# Patient Record
Sex: Male | Born: 1960 | Race: White | Hispanic: No | Marital: Married | Smoking: Former smoker
Health system: Southern US, Community
[De-identification: ages and names within clinical notes are randomized; demographics above are authoritative.]

## PROBLEM LIST (undated history)

## (undated) DIAGNOSIS — I2699 Other pulmonary embolism without acute cor pulmonale: Secondary | ICD-10-CM

## (undated) DIAGNOSIS — I499 Cardiac arrhythmia, unspecified: Secondary | ICD-10-CM

## (undated) DIAGNOSIS — G4730 Sleep apnea, unspecified: Secondary | ICD-10-CM

## (undated) HISTORY — PX: COLONOSCOPY: SHX174

---

## 1988-08-24 DIAGNOSIS — C801 Malignant (primary) neoplasm, unspecified: Secondary | ICD-10-CM

## 1988-08-24 HISTORY — DX: Malignant (primary) neoplasm, unspecified: C80.1

## 1999-02-05 ENCOUNTER — Emergency Department (HOSPITAL_COMMUNITY): Admission: EM | Admit: 1999-02-05 | Discharge: 1999-02-05 | Payer: Self-pay | Admitting: Internal Medicine

## 2011-02-15 ENCOUNTER — Emergency Department: Payer: Self-pay | Admitting: Emergency Medicine

## 2011-02-20 ENCOUNTER — Other Ambulatory Visit: Payer: Self-pay | Admitting: Vascular Surgery

## 2011-08-07 ENCOUNTER — Other Ambulatory Visit: Payer: Self-pay

## 2012-06-28 IMAGING — US US EXTREM LOW VENOUS*L*
1 series · 17 of 24 positions shown · non-contrast
Comparison: none

REASON FOR EXAM: pain c/o pain and redness..R/O DVT
COMMENTS:

PROCEDURE:     US  - US DOPPLER LOW EXTR LEFT  - February 15, 2011  [DATE]
RESULT:     Comparison: None

[Series 1: us extrem low venous*left* · 17 of 39 slices shown]
[im 1/39]
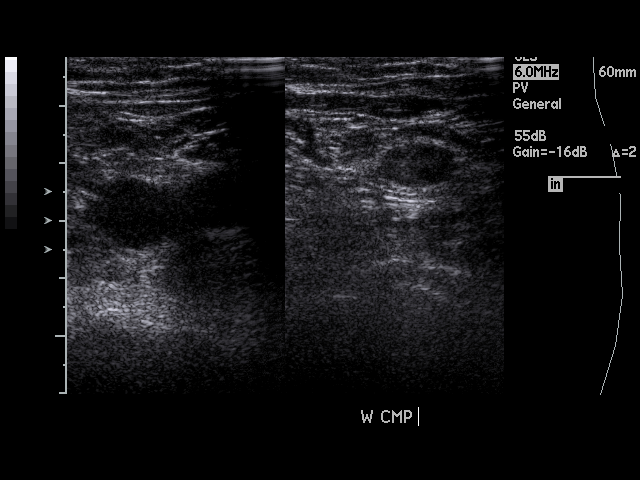
[im 4/39]
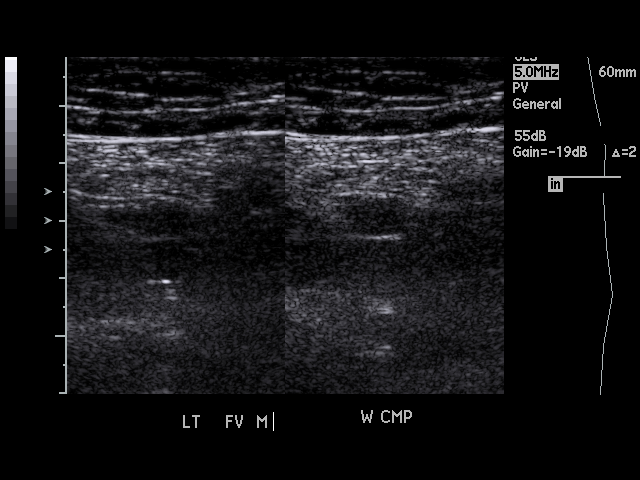
[im 5/39]
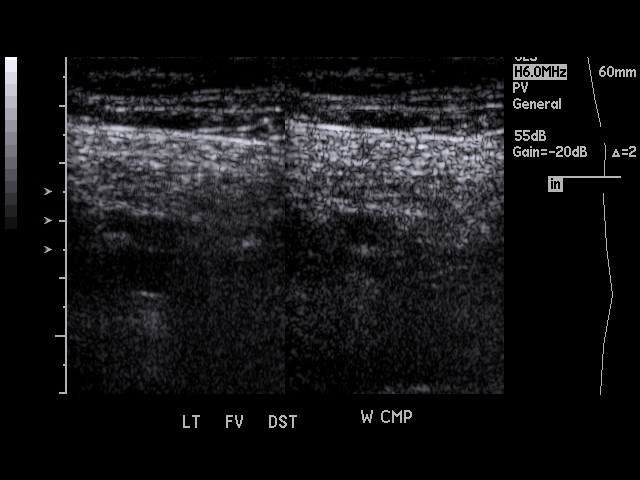
[im 7/39]
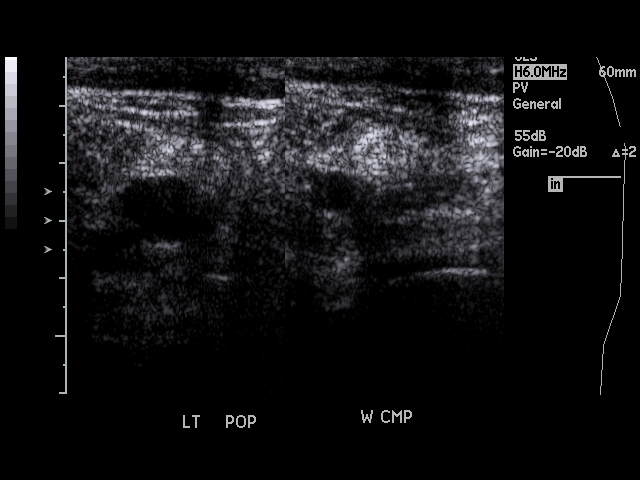
[im 10/39]
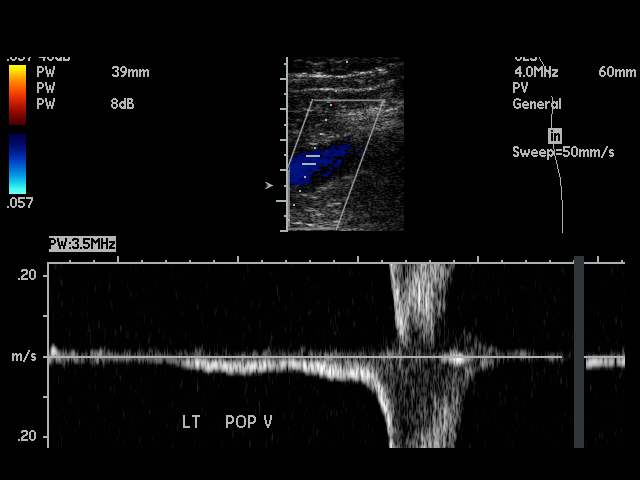
[im 12/39]
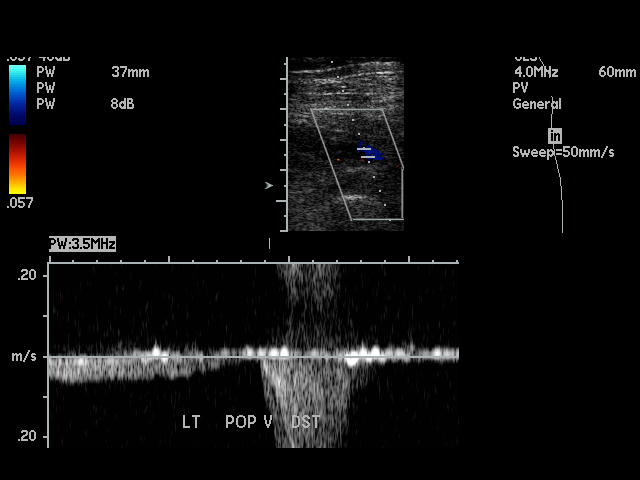
[im 15/39]
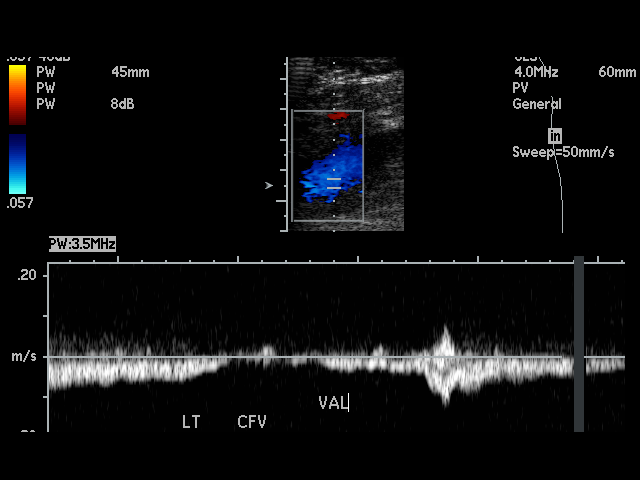
[im 17/39]
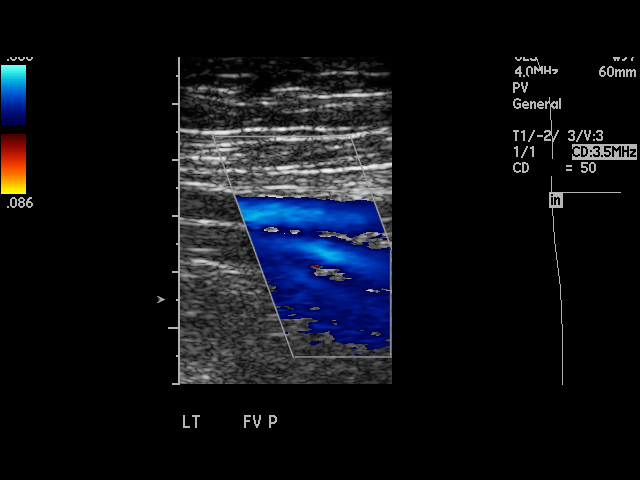
[im 20/39]
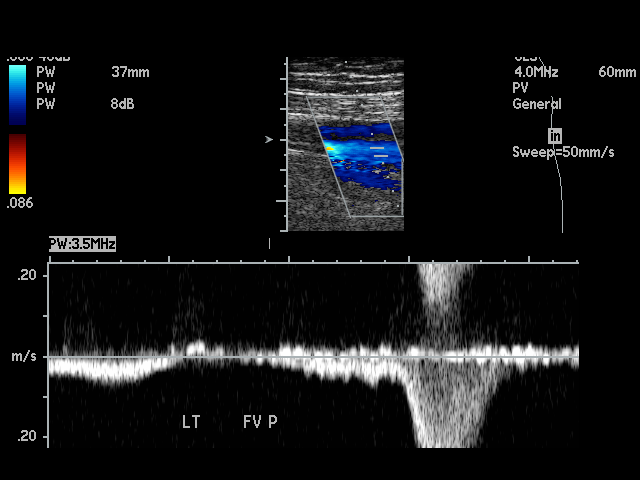
[im 22/39]
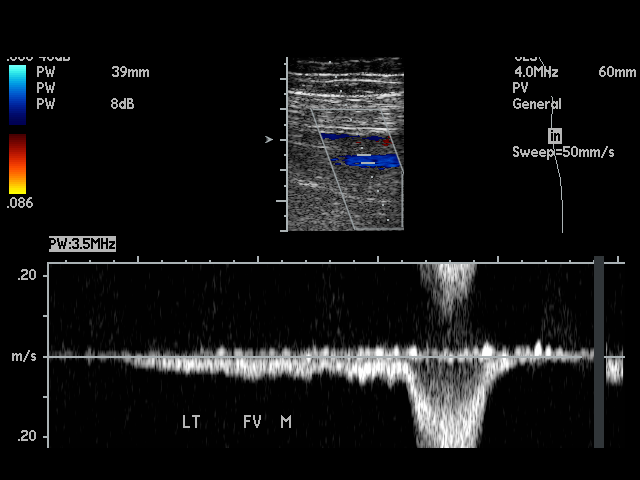
[im 24/39]
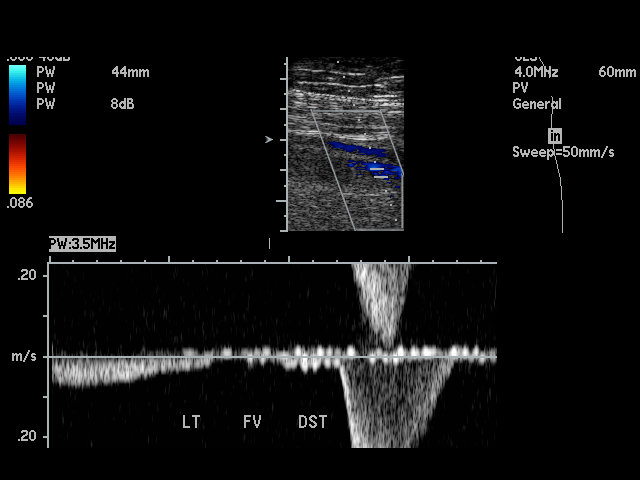
[im 27/39]
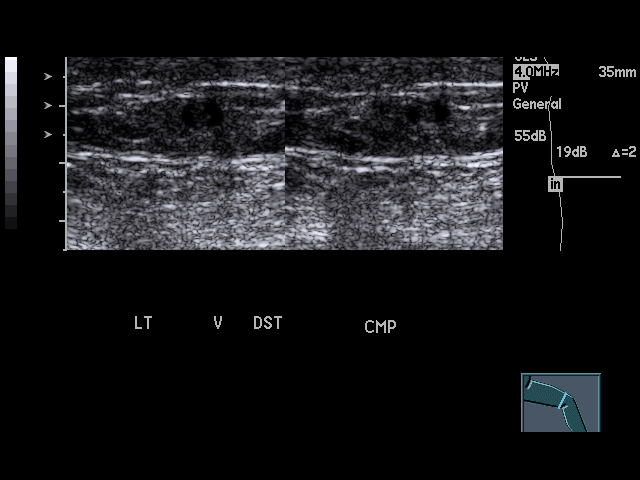
[im 29/39]
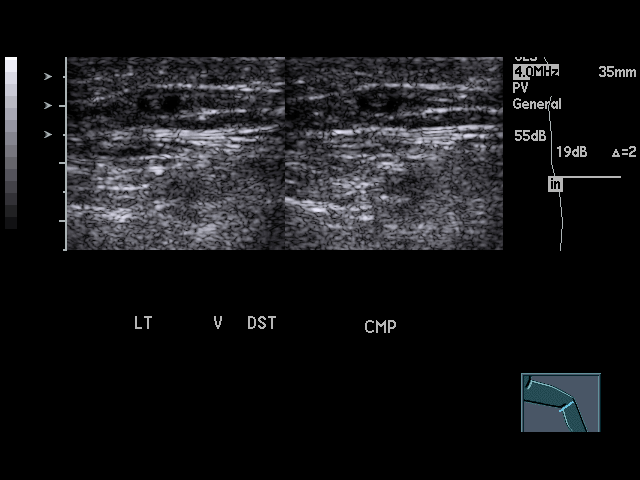
[im 32/39]
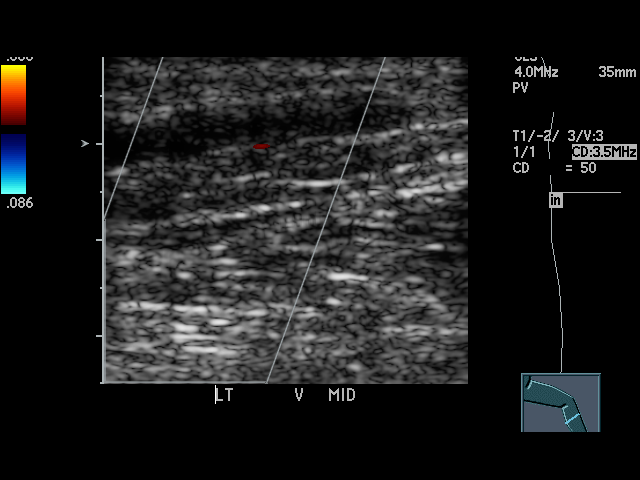
[im 34/39]
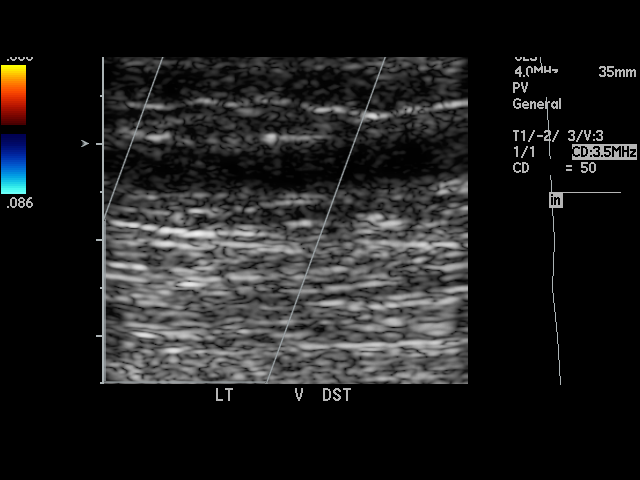
[im 35/39]
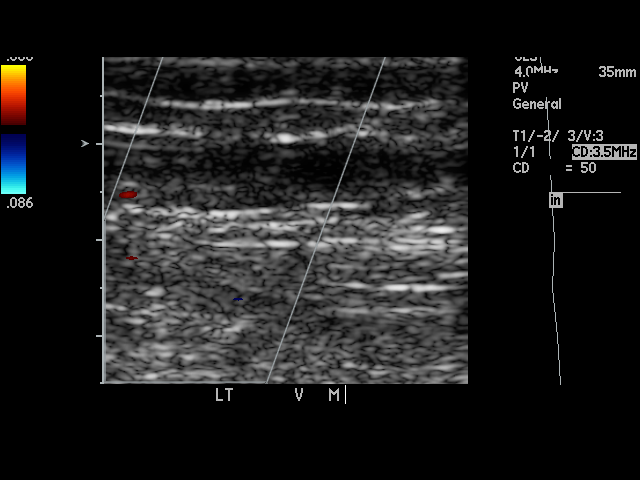
[im 39/39]
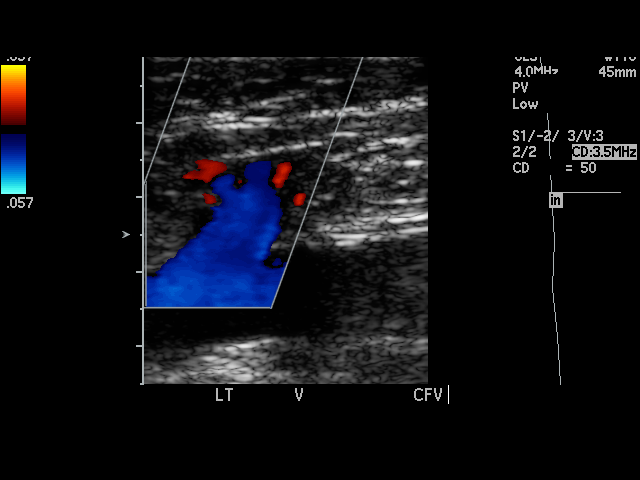

[17 of 24 positions shown; findings below may reference images not displayed]

FINDINGS: Multiple longitudinal and transverse gray-scale as well as color
and spectral Doppler images of the left lower extremity veins were obtained
from the common femoral veins through the popliteal veins.

The left common femoral, femoral, popliteal veins, and venous trifurcation
are patent, demonstrating normal color-flow and compressibility. No
intraluminal thrombus is identified.There is normal respiratory variation
and augmentation demonstrated at all vein levels.

The greater saphenous vein is noncompressible consistent with thrombosis.
IMPRESSION: 1. No evidence of DVT in the left lower extremity.

2. Superficial phlebitis of the greater left saphenous vein.

## 2012-10-16 ENCOUNTER — Inpatient Hospital Stay: Payer: Self-pay | Admitting: Internal Medicine

## 2012-10-16 LAB — CK TOTAL AND CKMB (NOT AT ARMC)
CK, Total: 80 U/L (ref 35–232)
CK, Total: 82 U/L (ref 35–232)
CK-MB: 1.3 ng/mL (ref 0.5–3.6)
CK-MB: 0.8 ng/mL (ref 0.5–3.6)

## 2012-10-16 LAB — BASIC METABOLIC PANEL
Anion Gap: 8 (ref 7–16)
Co2: 22 mmol/L (ref 21–32)
Creatinine: 1.07 mg/dL (ref 0.60–1.30)
EGFR (Non-African Amer.): >60
Glucose: 99 mg/dL (ref 65–99)

## 2012-10-16 LAB — CBC
HCT: 45.4 % (ref 40.0–52.0)
HGB: 15.3 g/dL (ref 13.0–18.0)
MCHC: 33.7 g/dL (ref 32.0–36.0)
MCV: 95 fL (ref 80–100)
RBC: 4.8 10*6/uL (ref 4.40–5.90)
RDW: 13.1 % (ref 11.5–14.5)
WBC: 14.2 10*3/uL — ABNORMAL HIGH (ref 3.8–10.6)

## 2012-10-16 LAB — TROPONIN I: Troponin-I: <0.02 ng/mL

## 2012-10-16 LAB — PRO B NATRIURETIC PEPTIDE: B-Type Natriuretic Peptide: 748 pg/mL — ABNORMAL HIGH (ref 0–125)

## 2012-10-17 LAB — LIPID PANEL
Cholesterol: 239 mg/dL — ABNORMAL HIGH (ref 0–200)
Ldl Cholesterol, Calc: 164 mg/dL — ABNORMAL HIGH (ref 0–100)
VLDL Cholesterol, Calc: 16 mg/dL (ref 5–40)

## 2012-10-17 LAB — CBC WITH DIFFERENTIAL/PLATELET
Basophil #: 0 10*3/uL (ref 0.0–0.1)
HCT: 45 % (ref 40.0–52.0)
HGB: 14.9 g/dL (ref 13.0–18.0)
Lymphocyte #: 1.1 10*3/uL (ref 1.0–3.6)
Lymphocyte %: 5.9 %
MCH: 31.4 pg (ref 26.0–34.0)
MCHC: 33.2 g/dL (ref 32.0–36.0)
Monocyte #: 0.2 x10 3/mm (ref 0.2–1.0)
Neutrophil #: 17.9 10*3/uL — ABNORMAL HIGH (ref 1.4–6.5)
Platelet: 209 10*3/uL (ref 150–440)
RDW: 13.1 % (ref 11.5–14.5)
WBC: 19.2 10*3/uL — ABNORMAL HIGH (ref 3.8–10.6)

## 2012-10-22 LAB — CULTURE, BLOOD (SINGLE)

## 2013-06-07 ENCOUNTER — Ambulatory Visit: Payer: Self-pay | Admitting: Family Medicine

## 2013-06-07 LAB — CREATININE, SERUM: Creatinine: 0.96 mg/dL (ref 0.60–1.30)

## 2014-02-27 IMAGING — CR DG CHEST 2V
1 series · 2 of 2 positions shown · non-contrast
Comparison: none

REASON FOR EXAM: SOB
COMMENTS:

PROCEDURE:     DXR - DXR CHEST PA (OR AP) AND LATERAL  - October 16, 2012  [DATE]
RESULT:     The lungs are well-expanded. The interstitial markings are
increased diffusely. The cardiac silhouette is not enlarged. The pulmonary
vascularity is mildly prominent centrally.

[Series 1: w chest pa · 0.14mm/px · 2 of 2 slices shown]
[im 1/2]
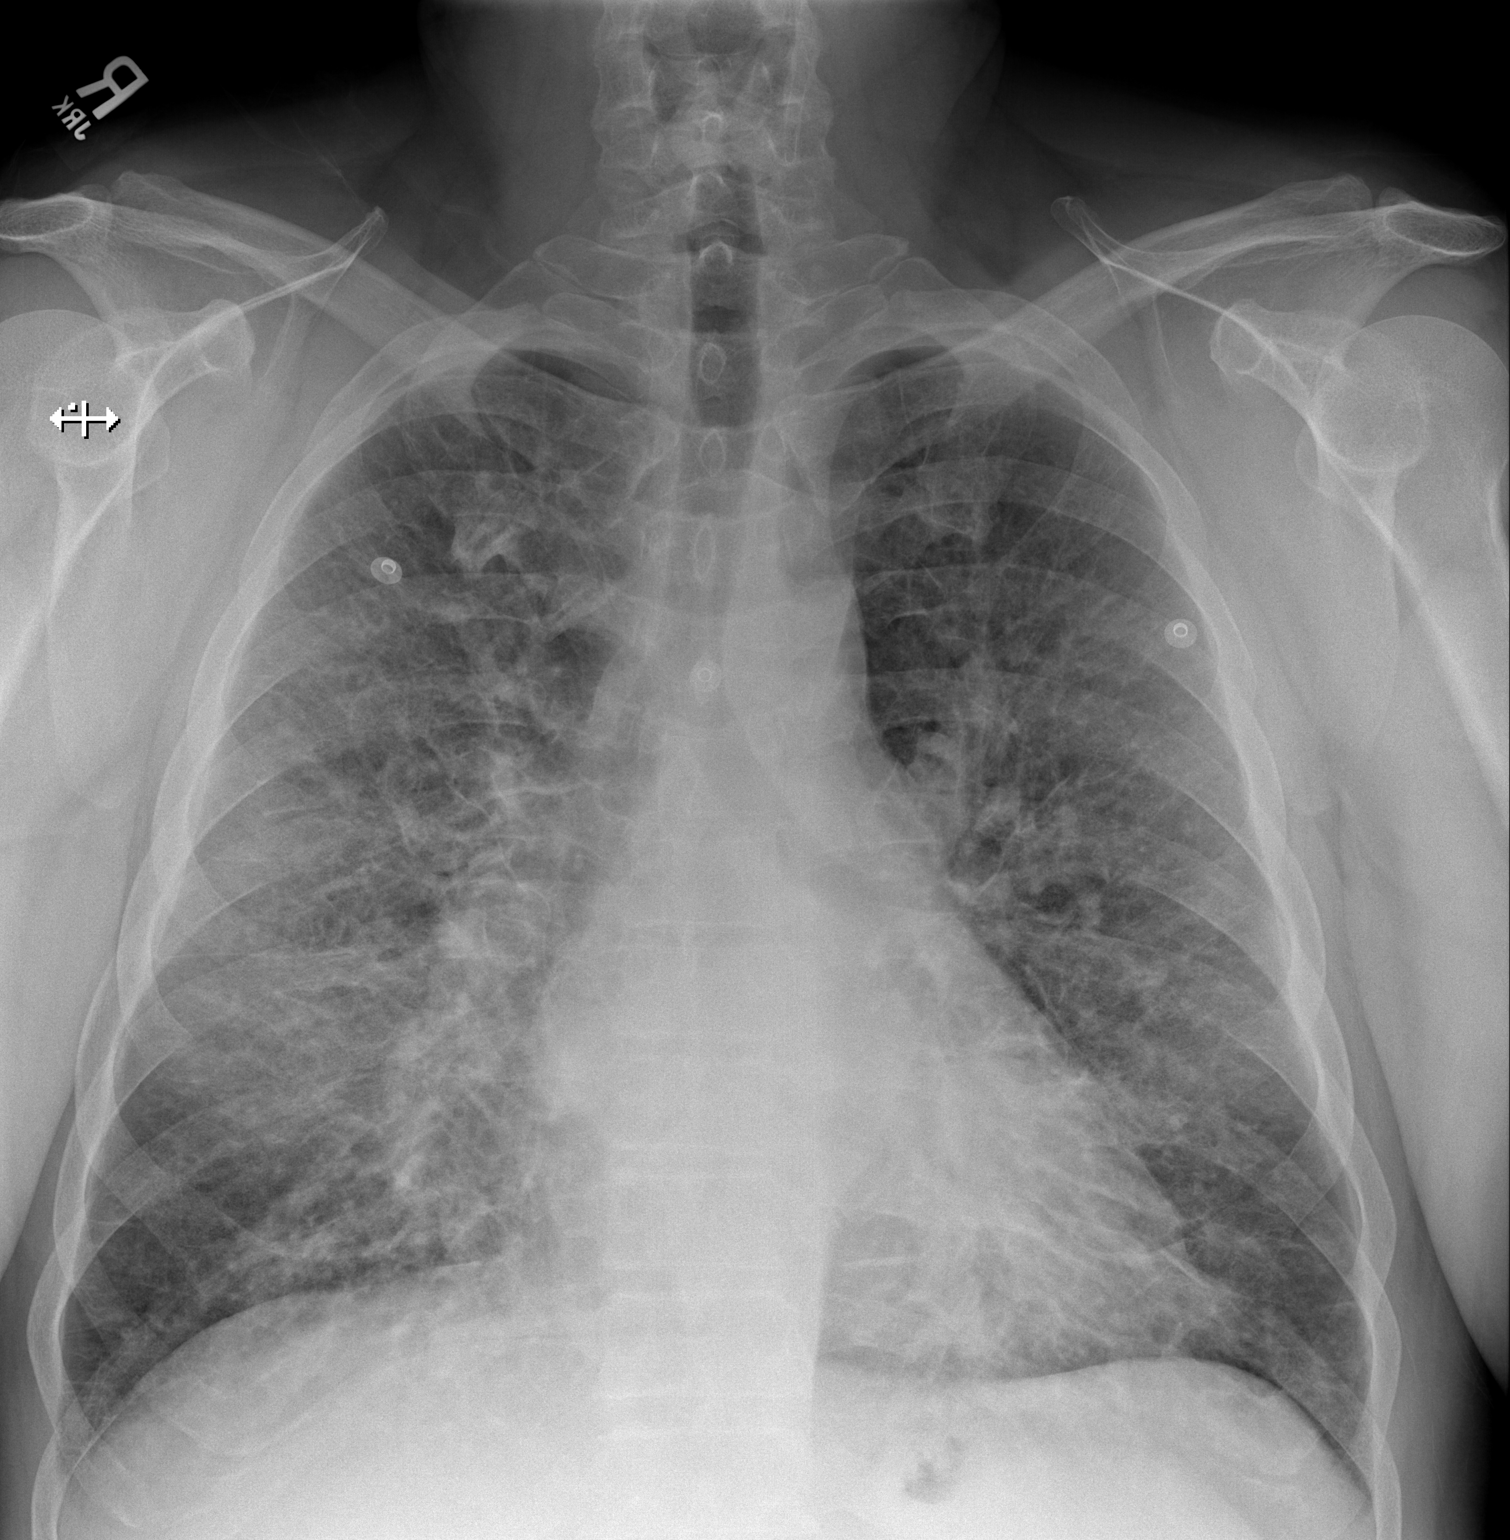
[im 2/2]
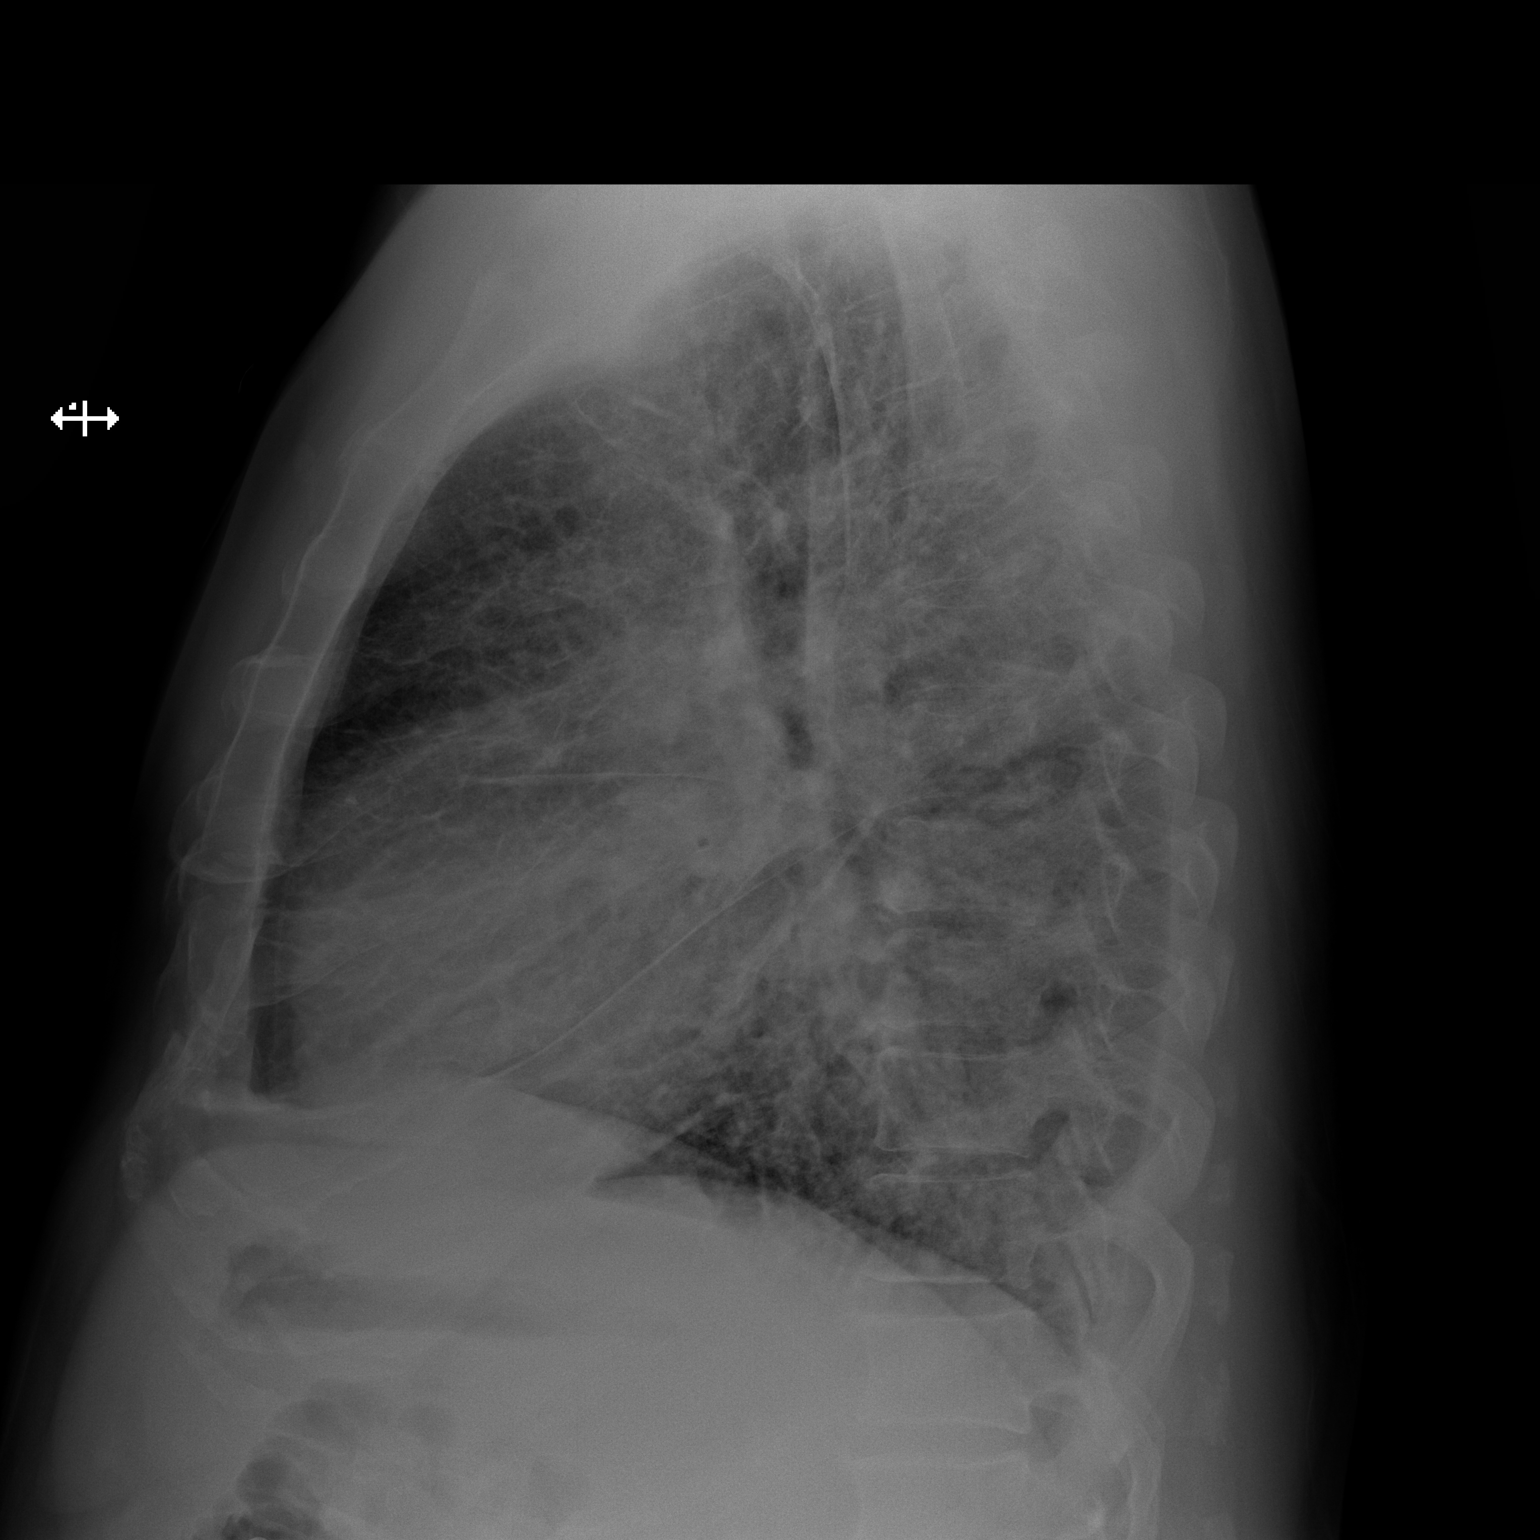

[2 of 2 positions shown; findings below may reference images not displayed]

IMPRESSION: The findings are consistent with interstitial pneumonia.
There are areas of near confluence as well. There may be an element of
low-grade CHF in the appropriate clinical setting though I favor a primary
pulmonary parenchymal process.

Given the chronicity of the patient's symptoms followup pulmonary evaluation
and CT scanning is recommended.

[REDACTED]

## 2014-12-14 NOTE — Discharge Summary (Signed)
DATE OF BIRTH:  [birth year]  DATE OF ADMISSION:  10/16/2012  DATE OF DISCHARGE:  10/17/2012  PRIMARY CARE PHYSICIAN:  Dr. Astrid Divine   FINAL DIAGNOSES: 1.  Acute respiratory failure, which improved.  2.  Systemic inflammatory response syndrome, possible pneumonia.  3.  Chronic obstructive pulmonary disease exacerbation.  4.  Malignant hypertension.  5.  Tobacco abuse.  6.  Hyperlipidemia.   MEDICATIONS ON DISCHARGE:  Include Tamiflu 75 mg every 12 hours until completed, Levaquin 500 mg every 24 hours until completed, prednisone 5 mg - 4 tabs day 1, 3 tabs day 2, 2 tabs day 3, 1 tab day 4 and 5; aspirin 81 mg daily, lisinopril 5 mg daily.   DIET:  Regular consistency, low sodium diet.   ACTIVITY:  As tolerated.   FOLLOW UP:  With Cardiology, Dr. Clayborn Bigness, 1 to 2 weeks. Followup in 1 to 2 weeks with Dr. Astrid Divine.   REASON FOR ADMISSION: The patient was admitted 10/16/2012, discharged 10/17/2012. Came in with shortness of breath.   HISTORY OF PRESENT ILLNESS:  A 54 year old man who came to the Emergency Room with sudden shortness of breath. He is a smoker. In the ER, his initial blood pressure was 198/97. He had an elevated white count. He was admitted for shortness of breath, which initially was thought to be multifactorial related to pneumonia and COPD.  Laboratory and radiological data during the hospital course included a sinus tachycardia, left atrial enlargement. His BNP was 748, glucose 99, BUN 17, creatinine 1.07, sodium 140, potassium 3.9, chloride 110, CO2 of 22, calcium 8.3. White blood cell count 14.2, H and H 15.3 and 45.4, platelet count of 200. Troponin negative.  Chest x-ray: Findings consistent with interstitial pneumonia, areas of near confluence may be an element of low-grade CHF in the appropriate clinical setting, although I favor a primary pulmonary parenchymal process. Blood culture is negative. Influenza A and B negative. Troponin negative x 3. LDL 164, HDL 59,  triglycerides 81. Echocardiogram in a verbal report showed EF 45%, with some apical hypokinesis.   HOSPITAL COURSE PER PROBLEM LIST:  1.  For the patient's acute respiratory failure, this had resolved relatively quickly. The patient was on oxygen. The next day he was off oxygen and feeling much better.  2. Systemic inflammatory response syndrome, with elevated white count and tachycardia, pneumonia on chest x-ray. The patient was given Levaquin, also empiric Tamiflu was given for possible flu. The patient improved.   3.  Chronic obstructive pulmonary disease exacerbation. The patient was placed on antibiotics and steroids. Advised to stop smoking. Lungs were clear upon discharge. The patient was given a quick prednisone taper.   4.  Malignant hypertension. The patient had one blood pressure that was elevated. The rest of the blood pressures have been good. Since the patient did have apical hypokinesis on the echo, I did recommend followup with Dr. Clayborn Bigness, Cardiology, as outpatient, and he was given a low dose of lisinopril. I do not believe that this was congestive heart failure.   5.  Tobacco abuse. Smoking cessation counseling done 3 minutes by me. Nicotine patch refused.   6.  Hyperlipidemia. Can consider lipid-lowering medication as outpatient, once over this acute episode. LDL 164.   Close clinical followup as outpatient needed. Again, I do not believe this is congestive heart failure. I believe this is pneumonia.   Time spent on discharge:  35 minutes.     ____________________________ Tana Conch. Leslye Peer, MD rjw:mr D: 10/18/2012 17:25:00 ET T: 10/18/2012  19:28:32 ET JOB#: [id number]  cc: Floria Raveling. Astrid Divine, MD Dwayne D. Clayborn Bigness, MD Tana Conch. Leslye Peer, MD, <Dictator>    Marisue Brooklyn MD ELECTRONICALLY SIGNED 10/24/2012 18:37

## 2014-12-14 NOTE — H&P (Signed)
PATIENT NAME:  Mason Frank, Mason Frank MR#:  [id number] DATE OF BIRTH:  [birth year]  DATE OF ADMISSION:  10/16/2012  PRIMARY CARE PHYSICIAN: None.  CHIEF COMPLAINT: Shortness of breath.  HISTORY OF PRESENT ILLNESS: The patient is a 54 year old white male with no past medical history, presented to the Emergency Department with complaints of shortness of breath which woke him up around 4 a.m. from sleep.  The patient has been having cough for the last 3 months.  He had mild shortness of breath with exertion.  He denied having any increased swelling in the lower extremities.  He denies having any chest pain.  He denies having any fever.  The patient continues to smoke over a pack a day, drinks 3 to 4 beers a day.  He denies having any paroxysmal nocturnal dyspnea or orthopnea.  Workup in the Emergency Department showed the patient was tachycardiac with a systolic blood pressure of 198/97.  Chest x-ray revealed increased vascular congestion, concern about a left lower lobe patchy infiltrate.  The patient also has elevated BNP of over 750.  The patient has elevated white blood cell count of 14,000.  Concerning this, the patient received 1 dose of Levaquin in the Emergency Department.   PAST MEDICAL HISTORY: Superficial venous thrombosis of the left leg, on aspirin.  PAST SURGICAL HISTORY: None.   HOME MEDICATIONS:  Aspirin 81 mg daily.   ALLERGIES:  No known drug allergies.   SOCIAL HISTORY: He smokes over a pack a day.  He drinks alcohol, 3 to 4 beers a day.  He denies using any illicit drugs.  He works as a Administrator.  He is married, lives with his wife.   FAMILY HISTORY: Father is 42 with history of heart problems.  Mother is also 75, healthy.   REVIEW OF SYSTEMS:  CONSTITUTIONAL: He denies having any fatigue, weakness or weight gain.  EYES: He denies any vision change. EARS, NOSE AND THROAT: Denies having any sore throat, runny nose, or difficulty swallowing. RESPIRATIONS: He has shortness of  breath.  He has cough for 3 months.  CARDIOVASCULAR: No chest pain, palpitations.  GASTROINTESTINAL: No nausea, vomiting or diarrhea.  No abdominal pain.  No blood in his stool.  GENITOURINARY: Denies any dysuria, hematuria, increased frequency or urgency of urination.  ENDOCRINE: Denies any polyuria or polydipsia.  HEMATOLOGIC: No easy bruising or bleeding.  SKIN: No rash. MUSCULOSKELETAL: Denies having any joint pains or aches.  NEUROLOGICAL:  Denies having any numbness, weakness of any part of the body.   PHYSICAL EXAMINATION:  GENERAL: This is a well-built, well-nourished, age-appropriate male lying down on the bed, not in distress. VITAL SIGNS: Temperature 98.2, pulse 116, blood pressure 198/97, respiratory rate of 28, oxygen saturations initially were 89%, currently 93% on 2 liters of oxygen.  HEENT: Head normocephalic, atraumatic.  Eyes: No scleral icterus.  Conjunctivae normal.  Pupils are equal and reactive to light. Mucous membranes moist. NECK: Supple.  No lymphadenopathy.  No JVD.  No carotid bruits.   CHEST:  No focal tenderness.  Prolonged expiratory phase.  Bilateral wheezing, bibasilar crackles are heard.  HEART: S2, S2 regular.  Tachycardia.  ABDOMEN: Bowel sounds are plus.  Soft, nontender, nondistended.   EXTREMITIES: No pedal edema.  Pulses 2+.  NEUROLOGICAL: The patient is alert and oriented to place, person and time.  No apparent cranial nerve abnormalities.  No motor and sensory deficits.   LABORATORY, DIAGNOSTIC AND RADIOLOGICAL DATA:  CBC:  WBC of 14.2, hemoglobin 15.3, platelet count  200. BNP 748.  Cardiac enzymes are negative.    EKG, 12-lead:  Poor R-wave progression in the anteroseptal leads.   ASSESSMENT/PLAN: The patient is a 54 year old male with a history of heavy tobacco use, alcohol use, comes to the Emergency Department with sudden onset of shortness of breath.    1. Shortness of breath:  This seems to be multifactorial.  The patient may be having  underlying mild pneumonia, COPD exacerbation which might have caused strain on the heart causing him to have congestive heart failure.  Admit the patient to the monitored bed. Treat the underlying problems.   2. Pneumonia: The patient has elevated white blood cell count, has cough with some productive sputum.  We will keep the patient on Levaquin.  3. Chronic obstructive pulmonary disease exacerbation:  Continue the breathing treatments and Solu-Medrol.  4. Congestive heart failure:  Very highly concerning.  The patient has Q waves in the anteroseptal leads, has elevated BNP.  Chest x-ray shows vascular congestion.  I will obtain an echocardiogram, keep the patient on Lasix b.i.d.   5. Hypertension, poorly controlled:  I will keep the patient on Lisinopril 20 mg daily. However, the patient also will be given IV labetolol as needed . I will continue to follow up with the blood pressure.  6. Continued tobacco use:  I counseled with the patient.  The patient expressed understanding, stated that he will strongly consider quitting smoking.  If the patient gets anxious, we will keep the patient on a nicotine patch.  7. Alcohol use:  We will keep the patient on thiamine and folic acid.  We will watch closely for any alcohol withdrawal signs.  8. We will keep the patient on DVT prophylaxis with Lovenox.   ____________________________ Monica Becton, MD pv:cb D: 10/16/2012 08:09:03 ET T: 10/16/2012 08:25:18 ET JOB#: [id number]  cc: Monica Becton, MD, <Dictator> Monica Becton MD ELECTRONICALLY SIGNED 10/17/2012 7:25

## 2015-01-15 DIAGNOSIS — I429 Cardiomyopathy, unspecified: Secondary | ICD-10-CM | POA: Insufficient documentation

## 2015-01-15 DIAGNOSIS — C44310 Basal cell carcinoma of skin of unspecified parts of face: Secondary | ICD-10-CM | POA: Insufficient documentation

## 2015-02-05 DIAGNOSIS — M1A00X0 Idiopathic chronic gout, unspecified site, without tophus (tophi): Secondary | ICD-10-CM | POA: Insufficient documentation

## 2018-05-31 DIAGNOSIS — G4733 Obstructive sleep apnea (adult) (pediatric): Secondary | ICD-10-CM | POA: Insufficient documentation

## 2019-02-10 ENCOUNTER — Other Ambulatory Visit: Payer: Self-pay

## 2019-02-10 ENCOUNTER — Other Ambulatory Visit
Admission: RE | Admit: 2019-02-10 | Discharge: 2019-02-10 | Disposition: A | Discharge status: Home / Self Care | Payer: BC Managed Care – PPO | Source: Ambulatory Visit | Attending: Internal Medicine | Admitting: Internal Medicine

## 2019-02-10 DIAGNOSIS — Z1159 Encounter for screening for other viral diseases: Secondary | ICD-10-CM | POA: Insufficient documentation

## 2019-02-11 LAB — NOVEL CORONAVIRUS, NAA (HOSP ORDER, SEND-OUT TO REF LAB; TAT 18-24 HRS): SARS-CoV-2, NAA: NOT DETECTED

## 2019-02-15 ENCOUNTER — Ambulatory Visit: Admit: 2019-02-15 | Payer: BC Managed Care – PPO | Admitting: Internal Medicine

## 2019-02-15 ENCOUNTER — Other Ambulatory Visit: Payer: Self-pay

## 2019-02-15 ENCOUNTER — Encounter
Admission: RE | Disposition: A | Discharge status: Home / Self Care | Payer: Self-pay | Source: Home / Self Care | Attending: Internal Medicine

## 2019-02-15 ENCOUNTER — Ambulatory Visit
Admission: RE | Admit: 2019-02-15 | Discharge: 2019-02-15 | Disposition: A | Discharge status: Home / Self Care | Payer: BC Managed Care – PPO | Attending: Internal Medicine | Admitting: Internal Medicine

## 2019-02-15 DIAGNOSIS — I429 Cardiomyopathy, unspecified: Secondary | ICD-10-CM | POA: Insufficient documentation

## 2019-02-15 DIAGNOSIS — E669 Obesity, unspecified: Secondary | ICD-10-CM | POA: Diagnosis not present

## 2019-02-15 DIAGNOSIS — F17210 Nicotine dependence, cigarettes, uncomplicated: Secondary | ICD-10-CM | POA: Insufficient documentation

## 2019-02-15 DIAGNOSIS — Z7982 Long term (current) use of aspirin: Secondary | ICD-10-CM | POA: Insufficient documentation

## 2019-02-15 DIAGNOSIS — R9439 Abnormal result of other cardiovascular function study: Secondary | ICD-10-CM

## 2019-02-15 DIAGNOSIS — Z6835 Body mass index (BMI) 35.0-35.9, adult: Secondary | ICD-10-CM | POA: Diagnosis not present

## 2019-02-15 DIAGNOSIS — J432 Centrilobular emphysema: Secondary | ICD-10-CM | POA: Insufficient documentation

## 2019-02-15 DIAGNOSIS — I110 Hypertensive heart disease with heart failure: Secondary | ICD-10-CM | POA: Diagnosis not present

## 2019-02-15 DIAGNOSIS — Z79899 Other long term (current) drug therapy: Secondary | ICD-10-CM | POA: Insufficient documentation

## 2019-02-15 DIAGNOSIS — M109 Gout, unspecified: Secondary | ICD-10-CM | POA: Diagnosis not present

## 2019-02-15 DIAGNOSIS — E785 Hyperlipidemia, unspecified: Secondary | ICD-10-CM | POA: Insufficient documentation

## 2019-02-15 DIAGNOSIS — G4733 Obstructive sleep apnea (adult) (pediatric): Secondary | ICD-10-CM | POA: Insufficient documentation

## 2019-02-15 DIAGNOSIS — I272 Pulmonary hypertension, unspecified: Secondary | ICD-10-CM | POA: Insufficient documentation

## 2019-02-15 DIAGNOSIS — I509 Heart failure, unspecified: Secondary | ICD-10-CM | POA: Insufficient documentation

## 2019-02-15 HISTORY — PX: RIGHT/LEFT HEART CATH AND CORONARY ANGIOGRAPHY: CATH118266

## 2019-02-15 SURGERY — RIGHT/LEFT HEART CATH AND CORONARY ANGIOGRAPHY
Anesthesia: Moderate Sedation

## 2019-02-15 MED ORDER — FENTANYL CITRATE (PF) 100 MCG/2ML IJ SOLN
INTRAMUSCULAR | Status: DC | PRN
  Administered 2019-02-15: 50 ug via INTRAVENOUS

## 2019-02-15 MED ORDER — HEPARIN (PORCINE) IN NACL 1000-0.9 UT/500ML-% IV SOLN
INTRAVENOUS | Status: AC
  Filled 2019-02-15: qty 1000

## 2019-02-15 MED ORDER — FUROSEMIDE 10 MG/ML IJ SOLN
INTRAMUSCULAR | Status: AC
  Administered 2019-02-15: 20 mg via INTRAVENOUS
  Filled 2019-02-15: qty 2

## 2019-02-15 MED ORDER — ASPIRIN 81 MG PO CHEW
81.0000 mg | CHEWABLE_TABLET | ORAL | Status: AC
  Administered 2019-02-15: 81 mg via ORAL

## 2019-02-15 MED ORDER — FUROSEMIDE 10 MG/ML IJ SOLN
20.0000 mg | Freq: Once | INTRAMUSCULAR | Status: AC
  Administered 2019-02-15: 20 mg via INTRAVENOUS

## 2019-02-15 MED ORDER — SODIUM CHLORIDE 0.9% FLUSH
3.0000 mL | Freq: Two times a day (BID) | INTRAVENOUS | Status: DC
  Administered 2019-02-15: 3 mL via INTRAVENOUS

## 2019-02-15 MED ORDER — SODIUM CHLORIDE 0.9 % IV SOLN: 250.0000 mL | INTRAVENOUS | Status: DC | PRN

## 2019-02-15 MED ORDER — SODIUM CHLORIDE 0.9% FLUSH: 3.0000 mL | Freq: Two times a day (BID) | INTRAVENOUS | Status: DC

## 2019-02-15 MED ORDER — MIDAZOLAM HCL 2 MG/2ML IJ SOLN
INTRAMUSCULAR | Status: AC
  Filled 2019-02-15: qty 2

## 2019-02-15 MED ORDER — SODIUM CHLORIDE 0.9 % WEIGHT BASED INFUSION
1.5000 mL/kg/h | INTRAVENOUS | Status: AC
  Administered 2019-02-15: 1.5 mL/kg/h via INTRAVENOUS

## 2019-02-15 MED ORDER — ASPIRIN 81 MG PO CHEW
CHEWABLE_TABLET | ORAL | Status: AC
  Filled 2019-02-15: qty 1

## 2019-02-15 MED ORDER — ASPIRIN 81 MG PO CHEW: 81.0000 mg | CHEWABLE_TABLET | ORAL | Status: DC

## 2019-02-15 MED ORDER — IOHEXOL 300 MG/ML  SOLN
INTRAMUSCULAR | Status: DC | PRN
  Administered 2019-02-15: 120 mL via INTRA_ARTERIAL

## 2019-02-15 MED ORDER — MIDAZOLAM HCL 2 MG/2ML IJ SOLN
INTRAMUSCULAR | Status: DC | PRN
  Administered 2019-02-15: 1 mg via INTRAVENOUS

## 2019-02-15 MED ORDER — SODIUM CHLORIDE 0.9% FLUSH: 3.0000 mL | INTRAVENOUS | Status: DC | PRN

## 2019-02-15 MED ORDER — SODIUM CHLORIDE 0.9 % WEIGHT BASED INFUSION
0.5000 mL/kg/h | INTRAVENOUS | Status: DC
  Administered 2019-02-15: 0.5 mL/kg/h via INTRAVENOUS

## 2019-02-15 MED ORDER — FENTANYL CITRATE (PF) 100 MCG/2ML IJ SOLN
INTRAMUSCULAR | Status: AC
  Filled 2019-02-15: qty 2

## 2019-02-15 MED ORDER — SODIUM CHLORIDE 0.9 % WEIGHT BASED INFUSION: 3.0000 mL/kg/h | INTRAVENOUS | Status: DC

## 2019-02-15 MED ORDER — SODIUM CHLORIDE 0.9 % WEIGHT BASED INFUSION: 1.0000 mL/kg/h | INTRAVENOUS | Status: DC

## 2019-02-15 SURGICAL SUPPLY — 14 items
CATH INFINITI 5FR ANG PIGTAIL (CATHETERS) ×1 IMPLANT
CATH INFINITI 5FR JL4 (CATHETERS) ×1 IMPLANT
CATH INFINITI JR4 5F (CATHETERS) ×1 IMPLANT
CATH SWANZ 7F THERMO (CATHETERS) ×1 IMPLANT
DEVICE CLOSURE MYNXGRIP 5F (Vascular Products) ×1 IMPLANT
GUIDEWIRE EMER 3M J .025X150CM (WIRE) ×1 IMPLANT
KIT MANI 3VAL PERCEP (MISCELLANEOUS) ×2 IMPLANT
KIT RIGHT HEART (MISCELLANEOUS) ×2 IMPLANT
NDL PERC 18GX7CM (NEEDLE) IMPLANT
NEEDLE PERC 18GX7CM (NEEDLE) ×2 IMPLANT
PACK CARDIAC CATH (CUSTOM PROCEDURE TRAY) ×2 IMPLANT
SHEATH AVANTI 5FR X 11CM (SHEATH) ×1 IMPLANT
SHEATH AVANTI 7FRX11 (SHEATH) ×1 IMPLANT
WIRE GUIDERIGHT .035X150 (WIRE) ×1 IMPLANT

## 2019-02-15 NOTE — Progress Notes (Signed)
Patient's wife, Barnetta Chapel, and patient provided with discharge instructions verbally and hard copy. Scheduled follow up appointment and gave IV lasix as ordered prior to discharge. Patient stated no further questions at this time. Advised patient to contact cardiology office (Dr. Clayborn Bigness) with any additional questions that may arise post procedure. Cardiology contact information provided in discharge paperwork.

## 2019-02-15 NOTE — Discharge Instructions (Signed)

## 2019-02-16 ENCOUNTER — Encounter: Payer: Self-pay | Admitting: Internal Medicine

## 2019-02-16 DIAGNOSIS — I25118 Atherosclerotic heart disease of native coronary artery with other forms of angina pectoris: Secondary | ICD-10-CM | POA: Insufficient documentation

## 2019-04-24 ENCOUNTER — Other Ambulatory Visit: Payer: Self-pay

## 2019-04-24 ENCOUNTER — Encounter: Payer: BC Managed Care – PPO | Attending: Internal Medicine | Admitting: *Deleted

## 2019-04-24 ENCOUNTER — Encounter: Payer: Self-pay | Admitting: *Deleted

## 2019-04-24 DIAGNOSIS — Z955 Presence of coronary angioplasty implant and graft: Secondary | ICD-10-CM

## 2019-04-24 DIAGNOSIS — J449 Chronic obstructive pulmonary disease, unspecified: Secondary | ICD-10-CM | POA: Insufficient documentation

## 2019-04-24 DIAGNOSIS — E785 Hyperlipidemia, unspecified: Secondary | ICD-10-CM | POA: Insufficient documentation

## 2019-04-24 DIAGNOSIS — I10 Essential (primary) hypertension: Secondary | ICD-10-CM | POA: Insufficient documentation

## 2019-04-24 NOTE — Progress Notes (Signed)
Virtual Initial Orientation compelted. Diagnosis can be found in [facility] 8/18. EP/RD Orientation scheduled for 9/2 at 10am

## 2019-04-26 ENCOUNTER — Encounter: Payer: BC Managed Care – PPO | Attending: Internal Medicine

## 2019-04-26 ENCOUNTER — Other Ambulatory Visit: Payer: Self-pay

## 2019-04-26 VITALS — Ht 75.0 in | Wt 289.3 lb

## 2019-04-26 DIAGNOSIS — Z955 Presence of coronary angioplasty implant and graft: Secondary | ICD-10-CM

## 2019-04-26 NOTE — Patient Instructions (Signed)
Patient Instructions  Patient Details  Name: Mason Frank MRN: [id number] Date of Birth: 1961/07/24 Referring Provider:  Yolonda Kida, MD  Below are your personal goals for exercise, nutrition, and risk factors. Our goal is to help you stay on track towards obtaining and maintaining these goals. We will be discussing your progress on these goals with you throughout the program.  Initial Exercise Prescription: Initial Exercise Prescription - 04/26/19 1100      Date of Initial Exercise RX and Referring Provider   Date  04/26/19    Referring Provider  [facility]      Treadmill   MPH  2.5    Grade  1    Minutes  15    METs  3.26      NuStep   Level  3    SPM  80    Minutes  15    METs  3      Elliptical   Level  1    Speed  3    Minutes  15      T5 Nustep   Level  2    SPM  80    Minutes  15    METs  3      Prescription Details   Frequency (times per week)  3    Duration  Progress to 30 minutes of continuous aerobic without signs/symptoms of physical distress      Intensity   THRR 40-80% of Max Heartrate  110-144    Ratings of Perceived Exertion  11-13    Perceived Dyspnea  0-4      Resistance Training   Training Prescription  Yes    Weight  3 lb    Reps  10-15       Exercise Goals: Frequency: Be able to perform aerobic exercise two to three times per week in program working toward 2-5 days per week of home exercise.  Intensity: Work with a perceived exertion of 11 (fairly light) - 15 (hard) while following your exercise prescription.  We will make changes to your prescription with you as you progress through the program.   Duration: Be able to do 30 to 45 minutes of continuous aerobic exercise in addition to a 5 minute warm-up and a 5 minute cool-down routine.   Nutrition Goals: Your personal nutrition goals will be established when you do your nutrition analysis with the dietician.  The following are general nutrition guidelines to  follow: Cholesterol < 200mg /day Sodium < 1500mg /day Fiber: Men over 50 yrs - 30 grams per day  Personal Goals: Personal Goals and Risk Factors at Admission - 04/26/19 1206      Core Components/Risk Factors/Patient Goals on Admission    Weight Management  Yes    Intervention  Weight Management/Obesity: Establish reasonable short term and long term weight goals.    Admit Weight  289 lb 4.8 oz (131.2 kg)    Goal Weight: Short Term  279 lb (126.6 kg)    Goal Weight: Long Term  269 lb (122 kg)    Expected Outcomes  Short Term: Continue to assess and modify interventions until short term weight is achieved;Long Term: Adherence to nutrition and physical activity/exercise program aimed toward attainment of established weight goal;Weight Loss: Understanding of general recommendations for a balanced deficit meal plan, which promotes 1-2 lb weight loss per week and includes a negative energy balance of [phone] kcal/d;Understanding recommendations for meals to include 15-35% energy as protein, 25-35% energy from fat, 35-60%  energy from carbohydrates, less than 200mg  of dietary cholesterol, 20-35 gm of total fiber daily;Weight Gain: Understanding of general recommendations for a high calorie, high protein meal plan that promotes weight gain by distributing calorie intake throughout the day with the consumption for 4-5 meals, snacks, and/or supplements;Understanding of distribution of calorie intake throughout the day with the consumption of 4-5 meals/snacks    Intervention  Provide a combined exercise and nutrition program that is supplemented with education, support and counseling about heart failure. Directed toward relieving symptoms such as shortness of breath, decreased exercise tolerance, and extremity edema.    Expected Outcomes  Improve functional capacity of life;Short term: Attendance in program 2-3 days a week with increased exercise capacity. Reported lower sodium intake. Reported increased fruit and  vegetable intake. Reports medication compliance.;Short term: Daily weights obtained and reported for increase. Utilizing diuretic protocols set by physician.;Long term: Adoption of self-care skills and reduction of barriers for early signs and symptoms recognition and intervention leading to self-care maintenance.    Intervention  Provide education on lifestyle modifcations including regular physical activity/exercise, weight management, moderate sodium restriction and increased consumption of fresh fruit, vegetables, and low fat dairy, alcohol moderation, and smoking cessation.;Monitor prescription use compliance.    Expected Outcomes  Short Term: Continued assessment and intervention until BP is < 140/19mm HG in hypertensive participants. < 130/55mm HG in hypertensive participants with diabetes, heart failure or chronic kidney disease.;Long Term: Maintenance of blood pressure at goal levels.    Intervention  Provide education and support for participant on nutrition & aerobic/resistive exercise along with prescribed medications to achieve LDL 70mg , HDL >40mg .    Expected Outcomes  Short Term: Participant states understanding of desired cholesterol values and is compliant with medications prescribed. Participant is following exercise prescription and nutrition guidelines.;Long Term: Cholesterol controlled with medications as prescribed, with individualized exercise RX and with personalized nutrition plan. Value goals: LDL < 70mg , HDL > 40 mg.       Tobacco Use Initial Evaluation: Social History   Tobacco Use  Smoking Status Former Smoker  . Quit date: 04/2018  . Years since quitting: 1.0  Smokeless Tobacco Never Used    Exercise Goals and Review: Exercise Goals    Row Name 04/26/19 1157             Exercise Goals   Increase Physical Activity  Yes       Intervention  Provide advice, education, support and counseling about physical activity/exercise needs.;Develop an individualized  exercise prescription for aerobic and resistive training based on initial evaluation findings, risk stratification, comorbidities and participant's personal goals.       Expected Outcomes  Short Term: Attend rehab on a regular basis to increase amount of physical activity.;Long Term: Add in home exercise to make exercise part of routine and to increase amount of physical activity.;Long Term: Exercising regularly at least 3-5 days a week.       Increase Strength and Stamina  Yes       Intervention  Provide advice, education, support and counseling about physical activity/exercise needs.;Develop an individualized exercise prescription for aerobic and resistive training based on initial evaluation findings, risk stratification, comorbidities and participant's personal goals.       Expected Outcomes  Short Term: Increase workloads from initial exercise prescription for resistance, speed, and METs.;Short Term: Perform resistance training exercises routinely during rehab and add in resistance training at home;Long Term: Improve cardiorespiratory fitness, muscular endurance and strength as measured by increased METs and functional  capacity (6MWT)       Able to understand and use rate of perceived exertion (RPE) scale  Yes       Intervention  Provide education and explanation on how to use RPE scale       Expected Outcomes  Short Term: Able to use RPE daily in rehab to express subjective intensity level;Long Term:  Able to use RPE to guide intensity level when exercising independently       Able to understand and use Dyspnea scale  Yes       Intervention  Provide education and explanation on how to use Dyspnea scale       Expected Outcomes  Short Term: Able to use Dyspnea scale daily in rehab to express subjective sense of shortness of breath during exertion;Long Term: Able to use Dyspnea scale to guide intensity level when exercising independently       Knowledge and understanding of Target Heart Rate Range  (THRR)  Yes       Intervention  Provide education and explanation of THRR including how the numbers were predicted and where they are located for reference       Expected Outcomes  Short Term: Able to state/look up THRR;Short Term: Able to use daily as guideline for intensity in rehab;Long Term: Able to use THRR to govern intensity when exercising independently       Able to check pulse independently  Yes       Intervention  Provide education and demonstration on how to check pulse in carotid and radial arteries.;Review the importance of being able to check your own pulse for safety during independent exercise       Expected Outcomes  Short Term: Able to explain why pulse checking is important during independent exercise;Long Term: Able to check pulse independently and accurately       Understanding of Exercise Prescription  Yes       Intervention  Provide education, explanation, and written materials on patient's individual exercise prescription       Expected Outcomes  Short Term: Able to explain program exercise prescription;Long Term: Able to explain home exercise prescription to exercise independently          Copy of goals given to participant.

## 2019-04-26 NOTE — Progress Notes (Signed)
Cardiac Individual Treatment Plan  Patient Details  Name: Mason Frank MRN: [id number] Date of Birth: Jul 15, 1961 Referring Provider:     Cardiac Rehab from 04/26/2019 in [facility] Cardiac and Pulmonary Rehab  Referring Provider  [facility]      Initial Encounter Date:    Cardiac Rehab from 04/26/2019 in [facility] Cardiac and Pulmonary Rehab  Date  04/26/19      Visit Diagnosis: Status post coronary artery stent placement  Patient's Home Medications on Admission:  Current Outpatient Medications:  .  allopurinol (ZYLOPRIM) 100 MG tablet, Take 100 mg by mouth daily at 12 noon., Disp: , Rfl:  .  aspirin EC 81 MG tablet, Take 81 mg by mouth daily at 12 noon., Disp: , Rfl:  .  colchicine 0.6 MG tablet, Take 0.6 mg by mouth daily as needed (gout flares)., Disp: , Rfl:  .  furosemide (LASIX) 20 MG tablet, Take 20 mg by mouth daily at 12 noon., Disp: , Rfl:  .  losartan (COZAAR) 100 MG tablet, Take 100 mg by mouth daily at 12 noon., Disp: , Rfl:  .  metoprolol succinate (TOPROL-XL) 25 MG 24 hr tablet, Take 12.5 mg by mouth daily at 12 noon., Disp: , Rfl:  .  naproxen sodium (ALEVE) 220 MG tablet, Take 440 mg by mouth daily as needed (pain)., Disp: , Rfl:  .  potassium chloride (KLOR-CON) 20 MEQ packet, Take 20 mEq by mouth daily at 12 noon., Disp: , Rfl:  .  rosuvastatin (CRESTOR) 20 MG tablet, Take by mouth., Disp: , Rfl:  .  ticagrelor (BRILINTA) 90 MG TABS tablet, Take by mouth., Disp: , Rfl:   Past Medical History: No past medical history on file.  Tobacco Use: Social History   Tobacco Use  Smoking Status Former Smoker  . Quit date: 04/2018  . Years since quitting: 1.0  Smokeless Tobacco Never Used    Labs: Recent Review Scientist, physiological    Labs for ITP Cardiac and Pulmonary Rehab Latest Ref Rng & Units 10/17/2012   Cholestrol 0 - 200 mg/dL 239(H)   LDLCALC 0 - 100 mg/dL 164(H)   HDL 40 - 60 mg/dL 59   Trlycerides 0 - 200 mg/dL 81       Exercise Target Goals: Exercise Program  Goal: Individual exercise prescription set using results from initial 6 min walk test and THRR while considering  patient's activity barriers and safety.   Exercise Prescription Goal: Initial exercise prescription builds to 30-45 minutes a day of aerobic activity, 2-3 days per week.  Home exercise guidelines will be given to patient during program as part of exercise prescription that the participant will acknowledge.  Activity Barriers & Risk Stratification: Activity Barriers & Cardiac Risk Stratification - 04/24/19 1537      Activity Barriers & Cardiac Risk Stratification   Activity Barriers  Shortness of Breath    Cardiac Risk Stratification  High       6 Minute Walk: 6 Minute Walk    Row Name 04/26/19 1146         6 Minute Walk   Phase  Initial     Distance  1312 feet     Walk Time  6 minutes     # of Rest Breaks  0     MPH  2.48     METS  3.47     RPE  12     Perceived Dyspnea   1     VO2 Peak  12.15  Symptoms  No     Resting HR  76 bpm     Resting BP  134/68     Resting Oxygen Saturation   96 %     Exercise Oxygen Saturation  during 6 min walk  96 %     Max Ex. HR  103 bpm     Max Ex. BP  188/70     2 Minute Post BP  144/64        Oxygen Initial Assessment:   Oxygen Re-Evaluation:   Oxygen Discharge (Final Oxygen Re-Evaluation):   Initial Exercise Prescription: Initial Exercise Prescription - 04/26/19 1100      Date of Initial Exercise RX and Referring Provider   Date  04/26/19    Referring Provider  [facility]      Treadmill   MPH  2.5    Grade  1    Minutes  15    METs  3.26      NuStep   Level  3    SPM  80    Minutes  15    METs  3      Elliptical   Level  1    Speed  3    Minutes  15      T5 Nustep   Level  2    SPM  80    Minutes  15    METs  3      Prescription Details   Frequency (times per week)  3    Duration  Progress to 30 minutes of continuous aerobic without signs/symptoms of physical distress      Intensity    THRR 40-80% of Max Heartrate  110-144    Ratings of Perceived Exertion  11-13    Perceived Dyspnea  0-4      Resistance Training   Training Prescription  Yes    Weight  3 lb    Reps  10-15       Perform Capillary Blood Glucose checks as needed.  Exercise Prescription Changes: Exercise Prescription Changes    Row Name 04/26/19 1100             Response to Exercise   Blood Pressure (Admit)  134/68       Blood Pressure (Exercise)  180/70       Blood Pressure (Exit)  144/64       Heart Rate (Admit)  76 bpm       Heart Rate (Exercise)  103 bpm       Heart Rate (Exit)  79 bpm       Oxygen Saturation (Admit)  96 %       Oxygen Saturation (Exercise)  96 %       Rating of Perceived Exertion (Exercise)  12       Perceived Dyspnea (Exercise)  1       Symptoms  none          Exercise Comments:   Exercise Goals and Review: Exercise Goals    Row Name 04/26/19 1157             Exercise Goals   Increase Physical Activity  Yes       Intervention  Provide advice, education, support and counseling about physical activity/exercise needs.;Develop an individualized exercise prescription for aerobic and resistive training based on initial evaluation findings, risk stratification, comorbidities and participant's personal goals.       Expected Outcomes  Short Term: Attend rehab on a regular basis  to increase amount of physical activity.;Long Term: Add in home exercise to make exercise part of routine and to increase amount of physical activity.;Long Term: Exercising regularly at least 3-5 days a week.       Increase Strength and Stamina  Yes       Intervention  Provide advice, education, support and counseling about physical activity/exercise needs.;Develop an individualized exercise prescription for aerobic and resistive training based on initial evaluation findings, risk stratification, comorbidities and participant's personal goals.       Expected Outcomes  Short Term: Increase  workloads from initial exercise prescription for resistance, speed, and METs.;Short Term: Perform resistance training exercises routinely during rehab and add in resistance training at home;Long Term: Improve cardiorespiratory fitness, muscular endurance and strength as measured by increased METs and functional capacity (6MWT)       Able to understand and use rate of perceived exertion (RPE) scale  Yes       Intervention  Provide education and explanation on how to use RPE scale       Expected Outcomes  Short Term: Able to use RPE daily in rehab to express subjective intensity level;Long Term:  Able to use RPE to guide intensity level when exercising independently       Able to understand and use Dyspnea scale  Yes       Intervention  Provide education and explanation on how to use Dyspnea scale       Expected Outcomes  Short Term: Able to use Dyspnea scale daily in rehab to express subjective sense of shortness of breath during exertion;Long Term: Able to use Dyspnea scale to guide intensity level when exercising independently       Knowledge and understanding of Target Heart Rate Range (THRR)  Yes       Intervention  Provide education and explanation of THRR including how the numbers were predicted and where they are located for reference       Expected Outcomes  Short Term: Able to state/look up THRR;Short Term: Able to use daily as guideline for intensity in rehab;Long Term: Able to use THRR to govern intensity when exercising independently       Able to check pulse independently  Yes       Intervention  Provide education and demonstration on how to check pulse in carotid and radial arteries.;Review the importance of being able to check your own pulse for safety during independent exercise       Expected Outcomes  Short Term: Able to explain why pulse checking is important during independent exercise;Long Term: Able to check pulse independently and accurately       Understanding of Exercise  Prescription  Yes       Intervention  Provide education, explanation, and written materials on patient's individual exercise prescription       Expected Outcomes  Short Term: Able to explain program exercise prescription;Long Term: Able to explain home exercise prescription to exercise independently          Exercise Goals Re-Evaluation :   Discharge Exercise Prescription (Final Exercise Prescription Changes): Exercise Prescription Changes - 04/26/19 1100      Response to Exercise   Blood Pressure (Admit)  134/68    Blood Pressure (Exercise)  180/70    Blood Pressure (Exit)  144/64    Heart Rate (Admit)  76 bpm    Heart Rate (Exercise)  103 bpm    Heart Rate (Exit)  79 bpm    Oxygen Saturation (Admit)  96 %    Oxygen Saturation (Exercise)  96 %    Rating of Perceived Exertion (Exercise)  12    Perceived Dyspnea (Exercise)  1    Symptoms  none       Nutrition:  Target Goals: Understanding of nutrition guidelines, daily intake of sodium <1569m, cholesterol <2062m calories 30% from fat and 7% or less from saturated fats, daily to have 5 or more servings of fruits and vegetables.  Biometrics: Pre Biometrics - 04/26/19 1159      Pre Biometrics   Height  _0  (1.905 m)    Weight  289 lb 4.8 oz (131.2 kg)    BMI (Calculated)  36.16    Single Leg Stand  15.47 seconds        Nutrition Therapy Plan and Nutrition Goals: Nutrition Therapy & Goals - 04/26/19 1139      Nutrition Therapy   Diet  Low Na, HH diet    Protein (specify units)  100g    Fiber  30 grams    Whole Grain Foods  3 servings    Saturated Fats  12 max. grams    Fruits and Vegetables  5 servings/day    Sodium  1.5 grams      Personal Nutrition Goals   Nutrition Goal  ST: decrease Na LT: increase EF and go back to work    Comments  Pt reports B: cheerios with 2% milk. L: (truck) ham and cheese sandwich with mayo and white bread or (home) leftovers from dinner. D: Chicken, beef, fish, and steak on weekends  (typically meat is fried). Mix up the grain sometimes WG sometimes not (mac and cheese or brown rice for example). Pt reports not snacking often and that he drinks water during the day, but will have coffee with international delight creamer (too much per pt). Discussed HH and low Na eating.      Intervention Plan   Intervention  Prescribe, educate and counsel regarding individualized specific dietary modifications aiming towards targeted core components such as weight, hypertension, lipid management, diabetes, heart failure and other comorbidities.;Nutrition handout(s) given to patient.    Expected Outcomes  Short Term Goal: Understand basic principles of dietary content, such as calories, fat, sodium, cholesterol and nutrients.;Short Term Goal: A plan has been developed with personal nutrition goals set during dietitian appointment.;Long Term Goal: Adherence to prescribed nutrition plan.       Nutrition Assessments: Nutrition Assessments - 04/26/19 1157      MEDFICTS Scores   Pre Score  69       Nutrition Goals Re-Evaluation:   Nutrition Goals Discharge (Final Nutrition Goals Re-Evaluation):   Psychosocial: Target Goals: Acknowledge presence or absence of significant depression and/or stress, maximize coping skills, provide positive support system. Participant is able to verbalize types and ability to use techniques and skills needed for reducing stress and depression.   Initial Review & Psychosocial Screening: Initial Psych Review & Screening - 04/24/19 1548      Initial Review   Current issues with  Current Stress Concerns    Source of Stress Concerns  Occupation    Comments  Mr. ThTimkoan't return to work until his EF is at least higher than 40%. He has MD appointments coming up to reevaluate, he is hoping [facility] helps with that.      Family Dynamics   Good Support System?  Yes      Barriers   Psychosocial barriers to participate in program  There are no  identifiable  barriers or psychosocial needs.;The patient should benefit from training in stress management and relaxation.      Screening Interventions   Interventions  Encouraged to exercise;To provide support and resources with identified psychosocial needs;Provide feedback about the scores to participant    Expected Outcomes  Short Term goal: Utilizing psychosocial counselor, staff and physician to assist with identification of specific Stressors or current issues interfering with healing process. Setting desired goal for each stressor or current issue identified.;Long Term Goal: Stressors or current issues are controlled or eliminated.;Short Term goal: Identification and review with participant of any Quality of Life or Depression concerns found by scoring the questionnaire.;Long Term goal: The participant improves quality of Life and PHQ9 Scores as seen by post scores and/or verbalization of changes       Quality of Life Scores:   Scores of 19 and below usually indicate a poorer quality of life in these areas.  A difference of  2-3 points is a clinically meaningful difference.  A difference of 2-3 points in the total score of the Quality of Life Index has been associated with significant improvement in overall quality of life, self-image, physical symptoms, and general health in studies assessing change in quality of life.  PHQ-9: Recent Review Flowsheet Data    There is no flowsheet data to display.     Interpretation of Total Score  Total Score Depression Severity:  1-4 = Minimal depression, 5-9 = Mild depression, 10-14 = Moderate depression, 15-19 = Moderately severe depression, 20-27 = Severe depression   Psychosocial Evaluation and Intervention:   Psychosocial Re-Evaluation:   Psychosocial Discharge (Final Psychosocial Re-Evaluation):   Vocational Rehabilitation: Provide vocational rehab assistance to qualifying candidates.   Vocational Rehab Evaluation & Intervention: Vocational Rehab  - 04/24/19 1548      Initial Vocational Rehab Evaluation & Intervention   Assessment shows need for Vocational Rehabilitation  No       Education: Education Goals: Education classes will be provided on a variety of topics geared toward better understanding of heart health and risk factor modification. Participant will state understanding/return demonstration of topics presented as noted by education test scores.  Learning Barriers/Preferences: Learning Barriers/Preferences - 04/24/19 1548      Learning Barriers/Preferences   Learning Barriers  None    Learning Preferences  None       Education Topics:  AED/CPR: - Group verbal and written instruction with the use of models to demonstrate the basic use of the AED with the basic ABC's of resuscitation.   General Nutrition Guidelines/Fats and Fiber: -Group instruction provided by verbal, written material, models and posters to present the general guidelines for heart healthy nutrition. Gives an explanation and review of dietary fats and fiber.   Controlling Sodium/Reading Food Labels: -Group verbal and written material supporting the discussion of sodium use in heart healthy nutrition. Review and explanation with models, verbal and written materials for utilization of the food label.   Exercise Physiology & General Exercise Guidelines: - Group verbal and written instruction with models to review the exercise physiology of the cardiovascular system and associated critical values. Provides general exercise guidelines with specific guidelines to those with heart or lung disease.    Aerobic Exercise & Resistance Training: - Gives group verbal and written instruction on the various components of exercise. Focuses on aerobic and resistive training programs and the benefits of this training and how to safely progress through these programs..   Flexibility, Balance, Mind/Body Relaxation: Provides group verbal/written instruction  on the  benefits of flexibility and balance training, including mind/body exercise modes such as yoga, pilates and tai chi.  Demonstration and skill practice provided.   Stress and Anxiety: - Provides group verbal and written instruction about the health risks of elevated stress and causes of high stress.  Discuss the correlation between heart/lung disease and anxiety and treatment options. Review healthy ways to manage with stress and anxiety.   Depression: - Provides group verbal and written instruction on the correlation between heart/lung disease and depressed mood, treatment options, and the stigmas associated with seeking treatment.   Anatomy & Physiology of the Heart: - Group verbal and written instruction and models provide basic cardiac anatomy and physiology, with the coronary electrical and arterial systems. Review of Valvular disease and Heart Failure   Cardiac Procedures: - Group verbal and written instruction to review commonly prescribed medications for heart disease. Reviews the medication, class of the drug, and side effects. Includes the steps to properly store meds and maintain the prescription regimen. (beta blockers and nitrates)   Cardiac Medications I: - Group verbal and written instruction to review commonly prescribed medications for heart disease. Reviews the medication, class of the drug, and side effects. Includes the steps to properly store meds and maintain the prescription regimen.   Cardiac Medications II: -Group verbal and written instruction to review commonly prescribed medications for heart disease. Reviews the medication, class of the drug, and side effects. (all other drug classes)    Go Sex-Intimacy & Heart Disease, Get SMART - Goal Setting: - Group verbal and written instruction through game format to discuss heart disease and the return to sexual intimacy. Provides group verbal and written material to discuss and apply goal setting through the application  of the S.M.A.R.T. Method.   Other Matters of the Heart: - Provides group verbal, written materials and models to describe Stable Angina and Peripheral Artery. Includes description of the disease process and treatment options available to the cardiac patient.   Exercise & Equipment Safety: - Individual verbal instruction and demonstration of equipment use and safety with use of the equipment.   Cardiac Rehab from 04/26/2019 in [facility] Cardiac and Pulmonary Rehab  Date  04/26/19  Educator  AS  Instruction Review Code  1- Verbalizes Understanding      Infection Prevention: - Provides verbal and written material to individual with discussion of infection control including proper hand washing and proper equipment cleaning during exercise session.   Cardiac Rehab from 04/26/2019 in [facility] Cardiac and Pulmonary Rehab  Date  04/26/19  Educator  AS  Instruction Review Code  1- Verbalizes Understanding      Falls Prevention: - Provides verbal and written material to individual with discussion of falls prevention and safety.   Cardiac Rehab from 04/26/2019 in [facility] Cardiac and Pulmonary Rehab  Date  04/26/19  Educator  AS  Instruction Review Code  1- Verbalizes Understanding      Diabetes: - Individual verbal and written instruction to review signs/symptoms of diabetes, desired ranges of glucose level fasting, after meals and with exercise. Acknowledge that pre and post exercise glucose checks will be done for 3 sessions at entry of program.   Know Your Numbers and Risk Factors: -Group verbal and written instruction about important numbers in your health.  Discussion of what are risk factors and how they play a role in the disease process.  Review of Cholesterol, Blood Pressure, Diabetes, and BMI and the role they play in your overall health.  Sleep Hygiene: -Provides group verbal and written instruction about how sleep can affect your health.  Define sleep hygiene, discuss sleep cycles and  impact of sleep habits. Review good sleep hygiene tips.    Other: -Provides group and verbal instruction on various topics (see comments)   Knowledge Questionnaire Score:   Core Components/Risk Factors/Patient Goals at Admission: Personal Goals and Risk Factors at Admission - 04/26/19 1206      Core Components/Risk Factors/Patient Goals on Admission    Weight Management  Yes    Intervention  Weight Management/Obesity: Establish reasonable short term and long term weight goals.    Admit Weight  289 lb 4.8 oz (131.2 kg)    Goal Weight: Short Term  279 lb (126.6 kg)    Goal Weight: Long Term  269 lb (122 kg)    Expected Outcomes  Short Term: Continue to assess and modify interventions until short term weight is achieved;Long Term: Adherence to nutrition and physical activity/exercise program aimed toward attainment of established weight goal;Weight Loss: Understanding of general recommendations for a balanced deficit meal plan, which promotes 1-2 lb weight loss per week and includes a negative energy balance of [phone] kcal/d;Understanding recommendations for meals to include 15-35% energy as protein, 25-35% energy from fat, 35-60% energy from carbohydrates, less than 236m of dietary cholesterol, 20-35 gm of total fiber daily;Weight Gain: Understanding of general recommendations for a high calorie, high protein meal plan that promotes weight gain by distributing calorie intake throughout the day with the consumption for 4-5 meals, snacks, and/or supplements;Understanding of distribution of calorie intake throughout the day with the consumption of 4-5 meals/snacks    Intervention  Provide a combined exercise and nutrition program that is supplemented with education, support and counseling about heart failure. Directed toward relieving symptoms such as shortness of breath, decreased exercise tolerance, and extremity edema.    Expected Outcomes  Improve functional capacity of life;Short term:  Attendance in program 2-3 days a week with increased exercise capacity. Reported lower sodium intake. Reported increased fruit and vegetable intake. Reports medication compliance.;Short term: Daily weights obtained and reported for increase. Utilizing diuretic protocols set by physician.;Long term: Adoption of self-care skills and reduction of barriers for early signs and symptoms recognition and intervention leading to self-care maintenance.    Intervention  Provide education on lifestyle modifcations including regular physical activity/exercise, weight management, moderate sodium restriction and increased consumption of fresh fruit, vegetables, and low fat dairy, alcohol moderation, and smoking cessation.;Monitor prescription use compliance.    Expected Outcomes  Short Term: Continued assessment and intervention until BP is < 140/984mHG in hypertensive participants. < 130/8055mG in hypertensive participants with diabetes, heart failure or chronic kidney disease.;Long Term: Maintenance of blood pressure at goal levels.    Intervention  Provide education and support for participant on nutrition & aerobic/resistive exercise along with prescribed medications to achieve LDL <55m18mDL >40mg32m Expected Outcomes  Short Term: Participant states understanding of desired cholesterol values and is compliant with medications prescribed. Participant is following exercise prescription and nutrition guidelines.;Long Term: Cholesterol controlled with medications as prescribed, with individualized exercise RX and with personalized nutrition plan. Value goals: LDL < 55mg,61m > 40 mg.       Core Components/Risk Factors/Patient Goals Review:    Core Components/Risk Factors/Patient Goals at Discharge (Final Review):    ITP Comments: ITP Comments    Row Name 04/24/19 1544 04/26/19 1144         ITP Comments  Virtual  Initial Orientation compelted. Diagnosis can be found in [facility] 8/18. EP/RD Orientation scheduled  for 9/2 at 10am  Initial 6MWT and nutrition eval completed.  ITP created and sent to Dr Sabra Heck Medical Director         Comments: initial ITP

## 2019-04-28 ENCOUNTER — Other Ambulatory Visit: Payer: Self-pay

## 2019-04-28 DIAGNOSIS — Z955 Presence of coronary angioplasty implant and graft: Secondary | ICD-10-CM

## 2019-04-28 NOTE — Progress Notes (Signed)
Daily Session Note  Patient Details  Name: Mason Frank MRN: [id number] Date of Birth: [birth year] Referring Provider:     Cardiac Rehab from 04/26/2019 in [facility] Cardiac and Pulmonary Rehab  Referring Provider  [facility]      Encounter Date: 04/28/2019  Check In: Session Check In - 04/28/19 1021      Check-In   Supervising physician immediately available to respond to emergencies  See telemetry face sheet for immediately available ER MD    Location  [facility]-Cardiac & Pulmonary Rehab    Staff Present  Vida Rigger RN, BSN;Jessica Luan Pulling, MA, RCEP, CCRP, CCET;Joseph Hood RCP,RRT,BSRT    Virtual Visit  No    Medication changes reported      No    Fall or balance concerns reported     No    Warm-up and Cool-down  Performed on first and last piece of equipment    Resistance Training Performed  Yes    VAD Patient?  No    PAD/SET Patient?  No      Pain Assessment   Currently in Pain?  No/denies    Multiple Pain Sites  No          Social History   Tobacco Use  Smoking Status Former Smoker  . Quit date: 04/2018  . Years since quitting: 1.0  Smokeless Tobacco Never Used    Goals Met:  Exercise tolerated well No report of cardiac concerns or symptoms Strength training completed today  Goals Unmet:  Not Applicable  Comments: Pt able to follow exercise prescription today without complaint.  Will continue to monitor for progression.   Dr. Emily Filbert is Medical Director for [place] and [facility].

## 2019-05-02 ENCOUNTER — Encounter: Payer: BC Managed Care – PPO | Admitting: *Deleted

## 2019-05-02 ENCOUNTER — Other Ambulatory Visit: Payer: Self-pay

## 2019-05-02 DIAGNOSIS — Z955 Presence of coronary angioplasty implant and graft: Secondary | ICD-10-CM

## 2019-05-02 NOTE — Progress Notes (Signed)
Daily Session Note  Patient Details  Name: LADONTE VERSTRAETE MRN: [id number] Date of Birth: Aug 09, 1961 Referring Provider:     Cardiac Rehab from 04/26/2019 in [facility] Cardiac and Pulmonary Rehab  Referring Provider  [facility]      Encounter Date: 05/02/2019  Check In: Session Check In - 05/02/19 1023      Check-In   Supervising physician immediately available to respond to emergencies  See telemetry face sheet for immediately available ER MD    Location  [facility]-Cardiac & Pulmonary Rehab    Staff Present  Heath Lark, RN, BSN, CCRP;Amanda Sommer, BA, ACSM CEP, Exercise Physiologist;Joseph Hood RCP,RRT,BSRT    Virtual Visit  No    Medication changes reported      No    Fall or balance concerns reported     No    Warm-up and Cool-down  Performed on first and last piece of equipment    Resistance Training Performed  Yes    VAD Patient?  No    PAD/SET Patient?  No      Pain Assessment   Currently in Pain?  No/denies          Social History   Tobacco Use  Smoking Status Former Smoker  . Quit date: 04/2018  . Years since quitting: 1.0  Smokeless Tobacco Never Used    Goals Met:  Independence with exercise equipment Exercise tolerated well No report of cardiac concerns or symptoms Strength training completed today  Goals Unmet:  Not Applicable  Comments: Pt able to follow exercise prescription today without complaint.  Will continue to monitor for progression.    Dr. Emily Filbert is Medical Director for [place] and [facility].

## 2019-05-03 ENCOUNTER — Encounter: Payer: Self-pay | Admitting: *Deleted

## 2019-05-03 DIAGNOSIS — Z955 Presence of coronary angioplasty implant and graft: Secondary | ICD-10-CM

## 2019-05-03 NOTE — Progress Notes (Signed)
Cardiac Individual Treatment Plan  Patient Details  Name: Mason Frank MRN: [id number] Date of Birth: Jul 15, 1961 Referring Provider:     Cardiac Rehab from 04/26/2019 in [facility] Cardiac and Pulmonary Rehab  Referring Provider  [facility]      Initial Encounter Date:    Cardiac Rehab from 04/26/2019 in [facility] Cardiac and Pulmonary Rehab  Date  04/26/19      Visit Diagnosis: Status post coronary artery stent placement  Patient's Home Medications on Admission:  Current Outpatient Medications:  .  allopurinol (ZYLOPRIM) 100 MG tablet, Take 100 mg by mouth daily at 12 noon., Disp: , Rfl:  .  aspirin EC 81 MG tablet, Take 81 mg by mouth daily at 12 noon., Disp: , Rfl:  .  colchicine 0.6 MG tablet, Take 0.6 mg by mouth daily as needed (gout flares)., Disp: , Rfl:  .  furosemide (LASIX) 20 MG tablet, Take 20 mg by mouth daily at 12 noon., Disp: , Rfl:  .  losartan (COZAAR) 100 MG tablet, Take 100 mg by mouth daily at 12 noon., Disp: , Rfl:  .  metoprolol succinate (TOPROL-XL) 25 MG 24 hr tablet, Take 12.5 mg by mouth daily at 12 noon., Disp: , Rfl:  .  naproxen sodium (ALEVE) 220 MG tablet, Take 440 mg by mouth daily as needed (pain)., Disp: , Rfl:  .  potassium chloride (KLOR-CON) 20 MEQ packet, Take 20 mEq by mouth daily at 12 noon., Disp: , Rfl:  .  rosuvastatin (CRESTOR) 20 MG tablet, Take by mouth., Disp: , Rfl:  .  ticagrelor (BRILINTA) 90 MG TABS tablet, Take by mouth., Disp: , Rfl:   Past Medical History: No past medical history on file.  Tobacco Use: Social History   Tobacco Use  Smoking Status Former Smoker  . Quit date: 04/2018  . Years since quitting: 1.0  Smokeless Tobacco Never Used    Labs: Recent Review Scientist, physiological    Labs for ITP Cardiac and Pulmonary Rehab Latest Ref Rng & Units 10/17/2012   Cholestrol 0 - 200 mg/dL 239(H)   LDLCALC 0 - 100 mg/dL 164(H)   HDL 40 - 60 mg/dL 59   Trlycerides 0 - 200 mg/dL 81       Exercise Target Goals: Exercise Program  Goal: Individual exercise prescription set using results from initial 6 min walk test and THRR while considering  patient's activity barriers and safety.   Exercise Prescription Goal: Initial exercise prescription builds to 30-45 minutes a day of aerobic activity, 2-3 days per week.  Home exercise guidelines will be given to patient during program as part of exercise prescription that the participant will acknowledge.  Activity Barriers & Risk Stratification: Activity Barriers & Cardiac Risk Stratification - 04/24/19 1537      Activity Barriers & Cardiac Risk Stratification   Activity Barriers  Shortness of Breath    Cardiac Risk Stratification  High       6 Minute Walk: 6 Minute Walk    Row Name 04/26/19 1146         6 Minute Walk   Phase  Initial     Distance  1312 feet     Walk Time  6 minutes     # of Rest Breaks  0     MPH  2.48     METS  3.47     RPE  12     Perceived Dyspnea   1     VO2 Peak  12.15  Symptoms  No     Resting HR  76 bpm     Resting BP  134/68     Resting Oxygen Saturation   96 %     Exercise Oxygen Saturation  during 6 min walk  96 %     Max Ex. HR  103 bpm     Max Ex. BP  188/70     2 Minute Post BP  144/64        Oxygen Initial Assessment:   Oxygen Re-Evaluation:   Oxygen Discharge (Final Oxygen Re-Evaluation):   Initial Exercise Prescription: Initial Exercise Prescription - 04/26/19 1100      Date of Initial Exercise RX and Referring Provider   Date  04/26/19    Referring Provider  [facility]      Treadmill   MPH  2.5    Grade  1    Minutes  15    METs  3.26      NuStep   Level  3    SPM  80    Minutes  15    METs  3      Elliptical   Level  1    Speed  3    Minutes  15      T5 Nustep   Level  2    SPM  80    Minutes  15    METs  3      Prescription Details   Frequency (times per week)  3    Duration  Progress to 30 minutes of continuous aerobic without signs/symptoms of physical distress      Intensity    THRR 40-80% of Max Heartrate  110-144    Ratings of Perceived Exertion  11-13    Perceived Dyspnea  0-4      Resistance Training   Training Prescription  Yes    Weight  3 lb    Reps  10-15       Perform Capillary Blood Glucose checks as needed.  Exercise Prescription Changes: Exercise Prescription Changes    Row Name 04/26/19 1100             Response to Exercise   Blood Pressure (Admit)  134/68       Blood Pressure (Exercise)  180/70       Blood Pressure (Exit)  144/64       Heart Rate (Admit)  76 bpm       Heart Rate (Exercise)  103 bpm       Heart Rate (Exit)  79 bpm       Oxygen Saturation (Admit)  96 %       Oxygen Saturation (Exercise)  96 %       Rating of Perceived Exertion (Exercise)  12       Perceived Dyspnea (Exercise)  1       Symptoms  none          Exercise Comments:   Exercise Goals and Review: Exercise Goals    Row Name 04/26/19 1157             Exercise Goals   Increase Physical Activity  Yes       Intervention  Provide advice, education, support and counseling about physical activity/exercise needs.;Develop an individualized exercise prescription for aerobic and resistive training based on initial evaluation findings, risk stratification, comorbidities and participant's personal goals.       Expected Outcomes  Short Term: Attend rehab on a regular basis  to increase amount of physical activity.;Long Term: Add in home exercise to make exercise part of routine and to increase amount of physical activity.;Long Term: Exercising regularly at least 3-5 days a week.       Increase Strength and Stamina  Yes       Intervention  Provide advice, education, support and counseling about physical activity/exercise needs.;Develop an individualized exercise prescription for aerobic and resistive training based on initial evaluation findings, risk stratification, comorbidities and participant's personal goals.       Expected Outcomes  Short Term: Increase  workloads from initial exercise prescription for resistance, speed, and METs.;Short Term: Perform resistance training exercises routinely during rehab and add in resistance training at home;Long Term: Improve cardiorespiratory fitness, muscular endurance and strength as measured by increased METs and functional capacity (6MWT)       Able to understand and use rate of perceived exertion (RPE) scale  Yes       Intervention  Provide education and explanation on how to use RPE scale       Expected Outcomes  Short Term: Able to use RPE daily in rehab to express subjective intensity level;Long Term:  Able to use RPE to guide intensity level when exercising independently       Able to understand and use Dyspnea scale  Yes       Intervention  Provide education and explanation on how to use Dyspnea scale       Expected Outcomes  Short Term: Able to use Dyspnea scale daily in rehab to express subjective sense of shortness of breath during exertion;Long Term: Able to use Dyspnea scale to guide intensity level when exercising independently       Knowledge and understanding of Target Heart Rate Range (THRR)  Yes       Intervention  Provide education and explanation of THRR including how the numbers were predicted and where they are located for reference       Expected Outcomes  Short Term: Able to state/look up THRR;Short Term: Able to use daily as guideline for intensity in rehab;Long Term: Able to use THRR to govern intensity when exercising independently       Able to check pulse independently  Yes       Intervention  Provide education and demonstration on how to check pulse in carotid and radial arteries.;Review the importance of being able to check your own pulse for safety during independent exercise       Expected Outcomes  Short Term: Able to explain why pulse checking is important during independent exercise;Long Term: Able to check pulse independently and accurately       Understanding of Exercise  Prescription  Yes       Intervention  Provide education, explanation, and written materials on patient's individual exercise prescription       Expected Outcomes  Short Term: Able to explain program exercise prescription;Long Term: Able to explain home exercise prescription to exercise independently          Exercise Goals Re-Evaluation :   Discharge Exercise Prescription (Final Exercise Prescription Changes): Exercise Prescription Changes - 04/26/19 1100      Response to Exercise   Blood Pressure (Admit)  134/68    Blood Pressure (Exercise)  180/70    Blood Pressure (Exit)  144/64    Heart Rate (Admit)  76 bpm    Heart Rate (Exercise)  103 bpm    Heart Rate (Exit)  79 bpm    Oxygen Saturation (Admit)  96 %    Oxygen Saturation (Exercise)  96 %    Rating of Perceived Exertion (Exercise)  12    Perceived Dyspnea (Exercise)  1    Symptoms  none       Nutrition:  Target Goals: Understanding of nutrition guidelines, daily intake of sodium <1569m, cholesterol <2062m calories 30% from fat and 7% or less from saturated fats, daily to have 5 or more servings of fruits and vegetables.  Biometrics: Pre Biometrics - 04/26/19 1159      Pre Biometrics   Height  _0  (1.905 m)    Weight  289 lb 4.8 oz (131.2 kg)    BMI (Calculated)  36.16    Single Leg Stand  15.47 seconds        Nutrition Therapy Plan and Nutrition Goals: Nutrition Therapy & Goals - 04/26/19 1139      Nutrition Therapy   Diet  Low Na, HH diet    Protein (specify units)  100g    Fiber  30 grams    Whole Grain Foods  3 servings    Saturated Fats  12 max. grams    Fruits and Vegetables  5 servings/day    Sodium  1.5 grams      Personal Nutrition Goals   Nutrition Goal  ST: decrease Na LT: increase EF and go back to work    Comments  Pt reports B: cheerios with 2% milk. L: (truck) ham and cheese sandwich with mayo and white bread or (home) leftovers from dinner. D: Chicken, beef, fish, and steak on weekends  (typically meat is fried). Mix up the grain sometimes WG sometimes not (mac and cheese or brown rice for example). Pt reports not snacking often and that he drinks water during the day, but will have coffee with international delight creamer (too much per pt). Discussed HH and low Na eating.      Intervention Plan   Intervention  Prescribe, educate and counsel regarding individualized specific dietary modifications aiming towards targeted core components such as weight, hypertension, lipid management, diabetes, heart failure and other comorbidities.;Nutrition handout(s) given to patient.    Expected Outcomes  Short Term Goal: Understand basic principles of dietary content, such as calories, fat, sodium, cholesterol and nutrients.;Short Term Goal: A plan has been developed with personal nutrition goals set during dietitian appointment.;Long Term Goal: Adherence to prescribed nutrition plan.       Nutrition Assessments: Nutrition Assessments - 04/26/19 1157      MEDFICTS Scores   Pre Score  69       Nutrition Goals Re-Evaluation:   Nutrition Goals Discharge (Final Nutrition Goals Re-Evaluation):   Psychosocial: Target Goals: Acknowledge presence or absence of significant depression and/or stress, maximize coping skills, provide positive support system. Participant is able to verbalize types and ability to use techniques and skills needed for reducing stress and depression.   Initial Review & Psychosocial Screening: Initial Psych Review & Screening - 04/24/19 1548      Initial Review   Current issues with  Current Stress Concerns    Source of Stress Concerns  Occupation    Comments  Mr. ThTimkoan't return to work until his EF is at least higher than 40%. He has MD appointments coming up to reevaluate, he is hoping [facility] helps with that.      Family Dynamics   Good Support System?  Yes      Barriers   Psychosocial barriers to participate in program  There are no  identifiable  barriers or psychosocial needs.;The patient should benefit from training in stress management and relaxation.      Screening Interventions   Interventions  Encouraged to exercise;To provide support and resources with identified psychosocial needs;Provide feedback about the scores to participant    Expected Outcomes  Short Term goal: Utilizing psychosocial counselor, staff and physician to assist with identification of specific Stressors or current issues interfering with healing process. Setting desired goal for each stressor or current issue identified.;Long Term Goal: Stressors or current issues are controlled or eliminated.;Short Term goal: Identification and review with participant of any Quality of Life or Depression concerns found by scoring the questionnaire.;Long Term goal: The participant improves quality of Life and PHQ9 Scores as seen by post scores and/or verbalization of changes       Quality of Life Scores:  Quality of Life - 04/26/19 1222      Quality of Life   Select  Quality of Life      Quality of Life Scores   Health/Function Pre  19.2 %    Socioeconomic Pre  23.13 %    Psych/Spiritual Pre  22.86 %    Family Pre  22.8 %    GLOBAL Pre  21.34 %      Scores of 19 and below usually indicate a poorer quality of life in these areas.  A difference of  2-3 points is a clinically meaningful difference.  A difference of 2-3 points in the total score of the Quality of Life Index has been associated with significant improvement in overall quality of life, self-image, physical symptoms, and general health in studies assessing change in quality of life.  PHQ-9: Recent Review Flowsheet Data    Depression screen [facility] 2/9 04/26/2019   Decreased Interest 1   Down, Depressed, Hopeless 0   PHQ - 2 Score 1   Altered sleeping 0   Tired, decreased energy 1   Change in appetite 0   Feeling bad or failure about yourself  0   Trouble concentrating 0   Moving slowly or fidgety/restless 0    Suicidal thoughts 0   PHQ-9 Score 2   Difficult doing work/chores Not difficult at all     Interpretation of Total Score  Total Score Depression Severity:  1-4 = Minimal depression, 5-9 = Mild depression, 10-14 = Moderate depression, 15-19 = Moderately severe depression, 20-27 = Severe depression   Psychosocial Evaluation and Intervention:   Psychosocial Re-Evaluation:   Psychosocial Discharge (Final Psychosocial Re-Evaluation):   Vocational Rehabilitation: Provide vocational rehab assistance to qualifying candidates.   Vocational Rehab Evaluation & Intervention: Vocational Rehab - 04/24/19 1548      Initial Vocational Rehab Evaluation & Intervention   Assessment shows need for Vocational Rehabilitation  No       Education: Education Goals: Education classes will be provided on a variety of topics geared toward better understanding of heart health and risk factor modification. Participant will state understanding/return demonstration of topics presented as noted by education test scores.  Learning Barriers/Preferences: Learning Barriers/Preferences - 04/24/19 1548      Learning Barriers/Preferences   Learning Barriers  None    Learning Preferences  None       Education Topics:  AED/CPR: - Group verbal and written instruction with the use of models to demonstrate the basic use of the AED with the basic ABC's of resuscitation.   General Nutrition Guidelines/Fats and Fiber: -Group instruction provided by verbal, written material, models and posters to present the  general guidelines for heart healthy nutrition. Gives an explanation and review of dietary fats and fiber.   Controlling Sodium/Reading Food Labels: -Group verbal and written material supporting the discussion of sodium use in heart healthy nutrition. Review and explanation with models, verbal and written materials for utilization of the food label.   Exercise Physiology & General Exercise Guidelines: -  Group verbal and written instruction with models to review the exercise physiology of the cardiovascular system and associated critical values. Provides general exercise guidelines with specific guidelines to those with heart or lung disease.    Aerobic Exercise & Resistance Training: - Gives group verbal and written instruction on the various components of exercise. Focuses on aerobic and resistive training programs and the benefits of this training and how to safely progress through these programs..   Flexibility, Balance, Mind/Body Relaxation: Provides group verbal/written instruction on the benefits of flexibility and balance training, including mind/body exercise modes such as yoga, pilates and tai chi.  Demonstration and skill practice provided.   Stress and Anxiety: - Provides group verbal and written instruction about the health risks of elevated stress and causes of high stress.  Discuss the correlation between heart/lung disease and anxiety and treatment options. Review healthy ways to manage with stress and anxiety.   Depression: - Provides group verbal and written instruction on the correlation between heart/lung disease and depressed mood, treatment options, and the stigmas associated with seeking treatment.   Anatomy & Physiology of the Heart: - Group verbal and written instruction and models provide basic cardiac anatomy and physiology, with the coronary electrical and arterial systems. Review of Valvular disease and Heart Failure   Cardiac Procedures: - Group verbal and written instruction to review commonly prescribed medications for heart disease. Reviews the medication, class of the drug, and side effects. Includes the steps to properly store meds and maintain the prescription regimen. (beta blockers and nitrates)   Cardiac Medications I: - Group verbal and written instruction to review commonly prescribed medications for heart disease. Reviews the medication, class of  the drug, and side effects. Includes the steps to properly store meds and maintain the prescription regimen.   Cardiac Medications II: -Group verbal and written instruction to review commonly prescribed medications for heart disease. Reviews the medication, class of the drug, and side effects. (all other drug classes)    Go Sex-Intimacy & Heart Disease, Get SMART - Goal Setting: - Group verbal and written instruction through game format to discuss heart disease and the return to sexual intimacy. Provides group verbal and written material to discuss and apply goal setting through the application of the S.M.A.R.T. Method.   Other Matters of the Heart: - Provides group verbal, written materials and models to describe Stable Angina and Peripheral Artery. Includes description of the disease process and treatment options available to the cardiac patient.   Exercise & Equipment Safety: - Individual verbal instruction and demonstration of equipment use and safety with use of the equipment.   Cardiac Rehab from 04/26/2019 in [facility] Cardiac and Pulmonary Rehab  Date  04/26/19  Educator  AS  Instruction Review Code  1- Verbalizes Understanding      Infection Prevention: - Provides verbal and written material to individual with discussion of infection control including proper hand washing and proper equipment cleaning during exercise session.   Cardiac Rehab from 04/26/2019 in [facility] Cardiac and Pulmonary Rehab  Date  04/26/19  Educator  AS  Instruction Review Code  1- Verbalizes Understanding  Falls Prevention: - Provides verbal and written material to individual with discussion of falls prevention and safety.   Cardiac Rehab from 04/26/2019 in [facility] Cardiac and Pulmonary Rehab  Date  04/26/19  Educator  AS  Instruction Review Code  1- Verbalizes Understanding      Diabetes: - Individual verbal and written instruction to review signs/symptoms of diabetes, desired ranges of glucose level  fasting, after meals and with exercise. Acknowledge that pre and post exercise glucose checks will be done for 3 sessions at entry of program.   Know Your Numbers and Risk Factors: -Group verbal and written instruction about important numbers in your health.  Discussion of what are risk factors and how they play a role in the disease process.  Review of Cholesterol, Blood Pressure, Diabetes, and BMI and the role they play in your overall health.   Sleep Hygiene: -Provides group verbal and written instruction about how sleep can affect your health.  Define sleep hygiene, discuss sleep cycles and impact of sleep habits. Review good sleep hygiene tips.    Other: -Provides group and verbal instruction on various topics (see comments)   Knowledge Questionnaire Score: Knowledge Questionnaire Score - 04/26/19 1224      Knowledge Questionnaire Score   Pre Score  23/26       Core Components/Risk Factors/Patient Goals at Admission: Personal Goals and Risk Factors at Admission - 04/26/19 1206      Core Components/Risk Factors/Patient Goals on Admission    Weight Management  Yes    Intervention  Weight Management/Obesity: Establish reasonable short term and long term weight goals.    Admit Weight  289 lb 4.8 oz (131.2 kg)    Goal Weight: Short Term  279 lb (126.6 kg)    Goal Weight: Long Term  269 lb (122 kg)    Expected Outcomes  Short Term: Continue to assess and modify interventions until short term weight is achieved;Long Term: Adherence to nutrition and physical activity/exercise program aimed toward attainment of established weight goal;Weight Loss: Understanding of general recommendations for a balanced deficit meal plan, which promotes 1-2 lb weight loss per week and includes a negative energy balance of [phone] kcal/d;Understanding recommendations for meals to include 15-35% energy as protein, 25-35% energy from fat, 35-60% energy from carbohydrates, less than 226m of dietary  cholesterol, 20-35 gm of total fiber daily;Weight Gain: Understanding of general recommendations for a high calorie, high protein meal plan that promotes weight gain by distributing calorie intake throughout the day with the consumption for 4-5 meals, snacks, and/or supplements;Understanding of distribution of calorie intake throughout the day with the consumption of 4-5 meals/snacks    Intervention  Provide a combined exercise and nutrition program that is supplemented with education, support and counseling about heart failure. Directed toward relieving symptoms such as shortness of breath, decreased exercise tolerance, and extremity edema.    Expected Outcomes  Improve functional capacity of life;Short term: Attendance in program 2-3 days a week with increased exercise capacity. Reported lower sodium intake. Reported increased fruit and vegetable intake. Reports medication compliance.;Short term: Daily weights obtained and reported for increase. Utilizing diuretic protocols set by physician.;Long term: Adoption of self-care skills and reduction of barriers for early signs and symptoms recognition and intervention leading to self-care maintenance.    Intervention  Provide education on lifestyle modifcations including regular physical activity/exercise, weight management, moderate sodium restriction and increased consumption of fresh fruit, vegetables, and low fat dairy, alcohol moderation, and smoking cessation.;Monitor prescription use compliance.  Expected Outcomes  Short Term: Continued assessment and intervention until BP is < 140/58m HG in hypertensive participants. < 130/814mHG in hypertensive participants with diabetes, heart failure or chronic kidney disease.;Long Term: Maintenance of blood pressure at goal levels.    Intervention  Provide education and support for participant on nutrition & aerobic/resistive exercise along with prescribed medications to achieve LDL <7078mHDL >74m83m  Expected  Outcomes  Short Term: Participant states understanding of desired cholesterol values and is compliant with medications prescribed. Participant is following exercise prescription and nutrition guidelines.;Long Term: Cholesterol controlled with medications as prescribed, with individualized exercise RX and with personalized nutrition plan. Value goals: LDL < 70mg74mL > 40 mg.       Core Components/Risk Factors/Patient Goals Review:    Core Components/Risk Factors/Patient Goals at Discharge (Final Review):    ITP Comments: ITP Comments    Row Name 04/24/19 1544 04/26/19 1144 05/03/19 0718       ITP Comments  Virtual Initial Orientation compelted. Diagnosis can be found in [facility]. EP/RD Orientation scheduled for 9/2 at 10am  Initial 6MWT and nutrition eval completed.  ITP created and sent to Dr MilleSabra Heckcal Director  30 Day Review Completed today. Continue with ITP unless changed by Medical Director review.  New to program        Comments

## 2019-05-05 ENCOUNTER — Encounter: Payer: BC Managed Care – PPO | Admitting: *Deleted

## 2019-05-05 ENCOUNTER — Other Ambulatory Visit: Payer: Self-pay

## 2019-05-05 DIAGNOSIS — Z955 Presence of coronary angioplasty implant and graft: Secondary | ICD-10-CM

## 2019-05-05 NOTE — Progress Notes (Signed)
Daily Session Note  Patient Details  Name: Mason Frank MRN: [id number] Date of Birth: 1961/05/06 Referring Provider:     Cardiac Rehab from 04/26/2019 in [facility] Cardiac and Pulmonary Rehab  Referring Provider  [facility]      Encounter Date: 05/05/2019  Check In: Session Check In - 05/05/19 0949      Check-In   Supervising physician immediately available to respond to emergencies  See telemetry face sheet for immediately available ER MD    Location  [facility]-Cardiac & Pulmonary Rehab    Staff Present  Heath Lark, RN, BSN, CCRP;Jessica Mancos, MA, RCEP, CCRP, [place], [place], ACSM CEP, Exercise Physiologist    Virtual Visit  No    Medication changes reported      No    Fall or balance concerns reported     No    Warm-up and Cool-down  Performed on first and last piece of equipment    Resistance Training Performed  Yes    VAD Patient?  No    PAD/SET Patient?  No      Pain Assessment   Currently in Pain?  No/denies          Social History   Tobacco Use  Smoking Status Former Smoker  . Quit date: 04/2018  . Years since quitting: 1.0  Smokeless Tobacco Never Used    Goals Met:  Exercise tolerated well No report of cardiac concerns or symptoms  Goals Unmet:  Not Applicable  Comments: Pt able to follow exercise prescription today without complaint.  Will continue to monitor for progression.    Dr. Emily Filbert is Medical Director for [place] and [facility].

## 2019-05-09 ENCOUNTER — Encounter: Payer: BC Managed Care – PPO | Admitting: *Deleted

## 2019-05-09 ENCOUNTER — Other Ambulatory Visit: Payer: Self-pay

## 2019-05-09 DIAGNOSIS — Z955 Presence of coronary angioplasty implant and graft: Secondary | ICD-10-CM

## 2019-05-09 NOTE — Progress Notes (Signed)
Daily Session Note  Patient Details  Name: Mason Frank MRN: [id number] Date of Birth: 1961/07/04 Referring Provider:     Cardiac Rehab from 04/26/2019 in [facility] Cardiac and Pulmonary Rehab  Referring Provider  [facility]      Encounter Date: 05/09/2019  Check In: Session Check In - 05/09/19 1007      Check-In   Supervising physician immediately available to respond to emergencies  See telemetry face sheet for immediately available ER MD    Location  [facility]-Cardiac & Pulmonary Rehab    Staff Present  Heath Lark, RN, BSN, CCRP;Joseph Foy [place], [place], ACSM CEP, Exercise Physiologist;Melissa Caiola RDN, LDN    Virtual Visit  No    Medication changes reported      No    Fall or balance concerns reported     No    Warm-up and Cool-down  Performed on first and last piece of equipment    Resistance Training Performed  Yes    VAD Patient?  No    PAD/SET Patient?  No      Pain Assessment   Currently in Pain?  No/denies          Social History   Tobacco Use  Smoking Status Former Smoker  . Quit date: 04/2018  . Years since quitting: 1.0  Smokeless Tobacco Never Used    Goals Met:  Independence with exercise equipment Exercise tolerated well No report of cardiac concerns or symptoms  Goals Unmet:  Not Applicable  Comments: Pt able to follow exercise prescription today without complaint.  Will continue to monitor for progression.    Dr. Emily Filbert is Medical Director for [place] and [facility].

## 2019-05-12 ENCOUNTER — Other Ambulatory Visit: Payer: Self-pay

## 2019-05-12 DIAGNOSIS — Z955 Presence of coronary angioplasty implant and graft: Secondary | ICD-10-CM | POA: Diagnosis not present

## 2019-05-12 NOTE — Progress Notes (Signed)
Daily Session Note  Patient Details  Name: Mason Frank MRN: [id number] Date of Birth: [birth year] Referring Provider:     Cardiac Rehab from 04/26/2019 in [facility] Cardiac and Pulmonary Rehab  Referring Provider  [facility]      Encounter Date: 05/12/2019  Check In: Session Check In - 05/12/19 0942      Check-In   Supervising physician immediately available to respond to emergencies  See telemetry face sheet for immediately available ER MD    Location  [facility]-Cardiac & Pulmonary Rehab    Staff Present  Vida Rigger RN, Vickki Hearing, BA, ACSM CEP, Exercise Physiologist;Jessica [place], MA, RCEP, CCRP, CCET;Joseph [place]    Virtual Visit  No    Medication changes reported      No    Fall or balance concerns reported     No    Warm-up and Cool-down  Performed on first and last piece of equipment    Resistance Training Performed  Yes    VAD Patient?  No    PAD/SET Patient?  No      Pain Assessment   Currently in Pain?  No/denies    Multiple Pain Sites  No          Social History   Tobacco Use  Smoking Status Former Smoker  . Quit date: 04/2018  . Years since quitting: 1.0  Smokeless Tobacco Never Used    Goals Met:  Independence with exercise equipment Exercise tolerated well No report of cardiac concerns or symptoms Strength training completed today  Goals Unmet:  Not Applicable  Comments: Pt able to follow exercise prescription today without complaint.  Will continue to monitor for progression.   Dr. Emily Filbert is Medical Director for [place] and [facility].

## 2019-05-16 ENCOUNTER — Other Ambulatory Visit: Payer: Self-pay

## 2019-05-16 ENCOUNTER — Encounter: Payer: BC Managed Care – PPO | Admitting: *Deleted

## 2019-05-16 DIAGNOSIS — Z955 Presence of coronary angioplasty implant and graft: Secondary | ICD-10-CM

## 2019-05-16 NOTE — Progress Notes (Signed)
Daily Session Note  Patient Details  Name: Mason Frank MRN: [id number] Date of Birth: October 24, 1960 Referring Provider:     Cardiac Rehab from 04/26/2019 in [facility] Cardiac and Pulmonary Rehab  Referring Provider  [facility]      Encounter Date: 05/16/2019  Check In: Session Check In - 05/16/19 1007      Check-In   Supervising physician immediately available to respond to emergencies  See telemetry face sheet for immediately available ER MD    Location  [facility]-Cardiac & Pulmonary Rehab    Staff Present  Heath Lark, RN, BSN, CCRP;Joseph Hood RCP,RRT,BSRT;Amanda [place], [place], ACSM CEP, Exercise Physiologist    Virtual Visit  No    Medication changes reported      No    Fall or balance concerns reported     No    Warm-up and Cool-down  Performed on first and last piece of equipment    Resistance Training Performed  Yes    VAD Patient?  No    PAD/SET Patient?  No      Pain Assessment   Currently in Pain?  No/denies          Social History   Tobacco Use  Smoking Status Former Smoker  . Quit date: 04/2018  . Years since quitting: 1.0  Smokeless Tobacco Never Used    Goals Met:  Independence with exercise equipment Exercise tolerated well No report of cardiac concerns or symptoms  Goals Unmet:  Not Applicable  Comments: Pt able to follow exercise prescription today without complaint.  Will continue to monitor for progression.    Dr. Emily Filbert is Medical Director for [place] and [facility].

## 2019-05-19 ENCOUNTER — Other Ambulatory Visit: Payer: Self-pay

## 2019-05-19 DIAGNOSIS — Z955 Presence of coronary angioplasty implant and graft: Secondary | ICD-10-CM | POA: Diagnosis not present

## 2019-05-19 NOTE — Progress Notes (Signed)
Daily Session Note  Patient Details  Name: Mason Frank MRN: [id number] Date of Birth: 1960/12/12 Referring Provider:     Cardiac Rehab from 04/26/2019 in [facility] Cardiac and Pulmonary Rehab  Referring Provider  [facility]      Encounter Date: 05/19/2019  Check In: Session Check In - 05/19/19 0951      Check-In   Supervising physician immediately available to respond to emergencies  See telemetry face sheet for immediately available ER MD    Location  [facility]-Cardiac & Pulmonary Rehab    Staff Present  Vida Rigger RN, Vickki Hearing, BA, ACSM CEP, Exercise Physiologist;Jessica [place], MA, RCEP, CCRP, CCET;Joseph [place]    Virtual Visit  No    Medication changes reported      No    Fall or balance concerns reported     No    Warm-up and Cool-down  Performed on first and last piece of equipment    Resistance Training Performed  Yes    VAD Patient?  No    PAD/SET Patient?  No      Pain Assessment   Currently in Pain?  No/denies    Multiple Pain Sites  No          Social History   Tobacco Use  Smoking Status Former Smoker  . Quit date: 04/2018  . Years since quitting: 1.0  Smokeless Tobacco Never Used    Goals Met:  Independence with exercise equipment Exercise tolerated well No report of cardiac concerns or symptoms Strength training completed today  Goals Unmet:  Not Applicable  Comments: Pt able to follow exercise prescription today without complaint.  Will continue to monitor for progression.   Dr. Emily Filbert is Medical Director for [place] and [facility].

## 2019-05-23 ENCOUNTER — Other Ambulatory Visit: Payer: Self-pay

## 2019-05-23 ENCOUNTER — Encounter: Payer: BC Managed Care – PPO | Admitting: *Deleted

## 2019-05-23 DIAGNOSIS — Z955 Presence of coronary angioplasty implant and graft: Secondary | ICD-10-CM | POA: Diagnosis not present

## 2019-05-23 NOTE — Progress Notes (Signed)
Daily Session Note  Patient Details  Name: Mason Frank MRN: [id number] Date of Birth: 03/21/1961 Referring Provider:     Cardiac Rehab from 04/26/2019 in [facility] Cardiac and Pulmonary Rehab  Referring Provider  [facility]      Encounter Date: 05/23/2019  Check In: Session Check In - 05/23/19 1021      Check-In   Supervising physician immediately available to respond to emergencies  See telemetry face sheet for immediately available ER MD    Location  [facility]-Cardiac & Pulmonary Rehab    Staff Present  Nada Maclachlan, BA, ACSM CEP, Exercise Physiologist;Krista Frederico [place], RN BSN;Joseph Hood RCP,RRT,BSRT    Virtual Visit  No    Medication changes reported      No    Fall or balance concerns reported     No    Tobacco Cessation  No Change    Warm-up and Cool-down  Performed on first and last piece of equipment    Resistance Training Performed  Yes    VAD Patient?  No    PAD/SET Patient?  No      Pain Assessment   Currently in Pain?  No/denies          Social History   Tobacco Use  Smoking Status Former Smoker  . Quit date: 04/2018  . Years since quitting: 1.0  Smokeless Tobacco Never Used    Goals Met:  Independence with exercise equipment Exercise tolerated well No report of cardiac concerns or symptoms Strength training completed today  Goals Unmet:  Not Applicable  Comments: Pt able to follow exercise prescription today without complaint.  Will continue to monitor for progression.    Dr. Emily Filbert is Medical Director for [place] and [facility].

## 2019-05-25 ENCOUNTER — Other Ambulatory Visit: Payer: Self-pay

## 2019-05-25 ENCOUNTER — Encounter: Payer: BC Managed Care – PPO | Attending: Internal Medicine | Admitting: *Deleted

## 2019-05-25 DIAGNOSIS — Z955 Presence of coronary angioplasty implant and graft: Secondary | ICD-10-CM | POA: Diagnosis present

## 2019-05-25 NOTE — Progress Notes (Signed)
Daily Session Note  Patient Details  Name: Mason Frank MRN: [id number] Date of Birth: April 15, 1961 Referring Provider:     Cardiac Rehab from 04/26/2019 in [facility] Cardiac and Pulmonary Rehab  Referring Provider  [facility]      Encounter Date: 05/25/2019  Check In: Session Check In - 05/25/19 0941      Check-In   Supervising physician immediately available to respond to emergencies  See telemetry face sheet for immediately available ER MD    Location  [facility]-Cardiac & Pulmonary Rehab    Staff Present  Renita Papa, RN BSN;Jessica [place], MA, RCEP, CCRP, CCET;Amanda [place], [place], ACSM CEP, Exercise Physiologist    Virtual Visit  No    Medication changes reported      No    Fall or balance concerns reported     No    Warm-up and Cool-down  Performed on first and last piece of equipment    Resistance Training Performed  Yes    VAD Patient?  No    PAD/SET Patient?  No      Pain Assessment   Currently in Pain?  No/denies          Social History   Tobacco Use  Smoking Status Former Smoker  . Quit date: 04/2018  . Years since quitting: 1.0  Smokeless Tobacco Never Used    Goals Met:  Independence with exercise equipment Exercise tolerated well No report of cardiac concerns or symptoms Strength training completed today  Goals Unmet:  Not Applicable  Comments: Pt able to follow exercise prescription today without complaint.  Will continue to monitor for progression.    Dr. Emily Filbert is Medical Director for [place] and [facility].

## 2019-05-31 ENCOUNTER — Encounter: Payer: Self-pay | Admitting: *Deleted

## 2019-05-31 DIAGNOSIS — Z955 Presence of coronary angioplasty implant and graft: Secondary | ICD-10-CM

## 2019-05-31 NOTE — Progress Notes (Signed)
Cardiac Individual Treatment Plan  Patient Details  Name: Mason Frank MRN: [id number] Date of Birth: 10/02/1960 Referring Provider:     Cardiac Rehab from 04/26/2019 in [facility] Cardiac and Pulmonary Rehab  Referring Provider  [facility]      Initial Encounter Date:    Cardiac Rehab from 04/26/2019 in [facility] Cardiac and Pulmonary Rehab  Date  04/26/19      Visit Diagnosis: Status post coronary artery stent placement  Patient's Home Medications on Admission:  Current Outpatient Medications:  .  allopurinol (ZYLOPRIM) 100 MG tablet, Take 100 mg by mouth daily at 12 noon., Disp: , Rfl:  .  aspirin EC 81 MG tablet, Take 81 mg by mouth daily at 12 noon., Disp: , Rfl:  .  colchicine 0.6 MG tablet, Take 0.6 mg by mouth daily as needed (gout flares)., Disp: , Rfl:  .  furosemide (LASIX) 20 MG tablet, Take 20 mg by mouth daily at 12 noon., Disp: , Rfl:  .  losartan (COZAAR) 100 MG tablet, Take 100 mg by mouth daily at 12 noon., Disp: , Rfl:  .  metoprolol succinate (TOPROL-XL) 25 MG 24 hr tablet, Take 12.5 mg by mouth daily at 12 noon., Disp: , Rfl:  .  naproxen sodium (ALEVE) 220 MG tablet, Take 440 mg by mouth daily as needed (pain)., Disp: , Rfl:  .  potassium chloride (KLOR-CON) 20 MEQ packet, Take 20 mEq by mouth daily at 12 noon., Disp: , Rfl:  .  rosuvastatin (CRESTOR) 20 MG tablet, Take by mouth., Disp: , Rfl:  .  ticagrelor (BRILINTA) 90 MG TABS tablet, Take by mouth., Disp: , Rfl:   Past Medical History: No past medical history on file.  Tobacco Use: Social History   Tobacco Use  Smoking Status Former Smoker  . Quit date: 04/2018  . Years since quitting: 1.1  Smokeless Tobacco Never Used    Labs: Recent Review Scientist, physiological    Labs for ITP Cardiac and Pulmonary Rehab Latest Ref Rng & Units 10/17/2012   Cholestrol 0 - 200 mg/dL 239(H)   LDLCALC 0 - 100 mg/dL 164(H)   HDL 40 - 60 mg/dL 59   Trlycerides 0 - 200 mg/dL 81       Exercise Target Goals: Exercise Program  Goal: Individual exercise prescription set using results from initial 6 min walk test and THRR while considering  patient's activity barriers and safety.   Exercise Prescription Goal: Initial exercise prescription builds to 30-45 minutes a day of aerobic activity, 2-3 days per week.  Home exercise guidelines will be given to patient during program as part of exercise prescription that the participant will acknowledge.  Activity Barriers & Risk Stratification: Activity Barriers & Cardiac Risk Stratification - 04/24/19 1537      Activity Barriers & Cardiac Risk Stratification   Activity Barriers  Shortness of Breath    Cardiac Risk Stratification  High       6 Minute Walk: 6 Minute Walk    Row Name 04/26/19 1146         6 Minute Walk   Phase  Initial     Distance  1312 feet     Walk Time  6 minutes     # of Rest Breaks  0     MPH  2.48     METS  3.47     RPE  12     Perceived Dyspnea   1     VO2 Peak  12.15  Symptoms  No     Resting HR  76 bpm     Resting BP  134/68     Resting Oxygen Saturation   96 %     Exercise Oxygen Saturation  during 6 min walk  96 %     Max Ex. HR  103 bpm     Max Ex. BP  188/70     2 Minute Post BP  144/64        Oxygen Initial Assessment:   Oxygen Re-Evaluation:   Oxygen Discharge (Final Oxygen Re-Evaluation):   Initial Exercise Prescription: Initial Exercise Prescription - 04/26/19 1100      Date of Initial Exercise RX and Referring Provider   Date  04/26/19    Referring Provider  [facility]      Treadmill   MPH  2.5    Grade  1    Minutes  15    METs  3.26      NuStep   Level  3    SPM  80    Minutes  15    METs  3      Elliptical   Level  1    Speed  3    Minutes  15      T5 Nustep   Level  2    SPM  80    Minutes  15    METs  3      Prescription Details   Frequency (times per week)  3    Duration  Progress to 30 minutes of continuous aerobic without signs/symptoms of physical distress      Intensity    THRR 40-80% of Max Heartrate  110-144    Ratings of Perceived Exertion  11-13    Perceived Dyspnea  0-4      Resistance Training   Training Prescription  Yes    Weight  3 lb    Reps  10-15       Perform Capillary Blood Glucose checks as needed.  Exercise Prescription Changes: Exercise Prescription Changes    Row Name 04/26/19 1100 05/25/19 1300           Response to Exercise   Blood Pressure (Admit)  134/68  124/58      Blood Pressure (Exercise)  180/70  196/84      Blood Pressure (Exit)  144/64  148/74      Heart Rate (Admit)  76 bpm  76 bpm      Heart Rate (Exercise)  103 bpm  137 bpm      Heart Rate (Exit)  79 bpm  68 bpm      Oxygen Saturation (Admit)  96 %  -      Oxygen Saturation (Exercise)  96 %  -      Rating of Perceived Exertion (Exercise)  12  -      Perceived Dyspnea (Exercise)  1  -      Symptoms  none  none      Duration  -  Continue with 30 min of aerobic exercise without signs/symptoms of physical distress.      Intensity  -  THRR unchanged        Progression   Progression  -  Continue to progress workloads to maintain intensity without signs/symptoms of physical distress.      Average METs  -  2.3        Resistance Training   Training Prescription  -  Yes  Weight  -  3 lb      Reps  -  10-15        Interval Training   Interval Training  -  No        Treadmill   MPH  -  1.7      Grade  -  0      Minutes  -  15      METs  -  2.3        Elliptical   Level  -  1      Speed  -  3      Minutes  -  15         Exercise Comments:   Exercise Goals and Review: Exercise Goals    Row Name 04/26/19 1157             Exercise Goals   Increase Physical Activity  Yes       Intervention  Provide advice, education, support and counseling about physical activity/exercise needs.;Develop an individualized exercise prescription for aerobic and resistive training based on initial evaluation findings, risk stratification, comorbidities and  participant's personal goals.       Expected Outcomes  Short Term: Attend rehab on a regular basis to increase amount of physical activity.;Long Term: Add in home exercise to make exercise part of routine and to increase amount of physical activity.;Long Term: Exercising regularly at least 3-5 days a week.       Increase Strength and Stamina  Yes       Intervention  Provide advice, education, support and counseling about physical activity/exercise needs.;Develop an individualized exercise prescription for aerobic and resistive training based on initial evaluation findings, risk stratification, comorbidities and participant's personal goals.       Expected Outcomes  Short Term: Increase workloads from initial exercise prescription for resistance, speed, and METs.;Short Term: Perform resistance training exercises routinely during rehab and add in resistance training at home;Long Term: Improve cardiorespiratory fitness, muscular endurance and strength as measured by increased METs and functional capacity (6MWT)       Able to understand and use rate of perceived exertion (RPE) scale  Yes       Intervention  Provide education and explanation on how to use RPE scale       Expected Outcomes  Short Term: Able to use RPE daily in rehab to express subjective intensity level;Long Term:  Able to use RPE to guide intensity level when exercising independently       Able to understand and use Dyspnea scale  Yes       Intervention  Provide education and explanation on how to use Dyspnea scale       Expected Outcomes  Short Term: Able to use Dyspnea scale daily in rehab to express subjective sense of shortness of breath during exertion;Long Term: Able to use Dyspnea scale to guide intensity level when exercising independently       Knowledge and understanding of Target Heart Rate Range (THRR)  Yes       Intervention  Provide education and explanation of THRR including how the numbers were predicted and where they are  located for reference       Expected Outcomes  Short Term: Able to state/look up THRR;Short Term: Able to use daily as guideline for intensity in rehab;Long Term: Able to use THRR to govern intensity when exercising independently       Able to check pulse independently  Yes  Intervention  Provide education and demonstration on how to check pulse in carotid and radial arteries.;Review the importance of being able to check your own pulse for safety during independent exercise       Expected Outcomes  Short Term: Able to explain why pulse checking is important during independent exercise;Long Term: Able to check pulse independently and accurately       Understanding of Exercise Prescription  Yes       Intervention  Provide education, explanation, and written materials on patient's individual exercise prescription       Expected Outcomes  Short Term: Able to explain program exercise prescription;Long Term: Able to explain home exercise prescription to exercise independently          Exercise Goals Re-Evaluation : Exercise Goals Re-Evaluation    Row Name 05/12/19 0948 05/25/19 1328           Exercise Goal Re-Evaluation   Exercise Goals Review  Increase Physical Activity;Increase Strength and Stamina  Increase Physical Activity;Increase Strength and Stamina;Able to understand and use rate of perceived exertion (RPE) scale;Knowledge and understanding of Target Heart Rate Range (THRR);Able to check pulse independently;Understanding of Exercise Prescription      Comments  He feels like the exercise is difficult to do in the program. 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 Talked to patient about possibly doing Saturday and Sunday when he is off work to exercise. Informed him that he could walk or do whatever exercises he can at home.  Josph Macho has attended consistently and works in [employer] range.  Staff will review home exercise.      Expected Outcomes  Short: continue to exercise in  [facility]. Long: go over home exercise information.  Short - continue to attend consistently Long :- improve overall fitness         Discharge Exercise Prescription (Final Exercise Prescription Changes): Exercise Prescription Changes - 05/25/19 1300      Response to Exercise   Blood Pressure (Admit)  124/58    Blood Pressure (Exercise)  196/84    Blood Pressure (Exit)  148/74    Heart Rate (Admit)  76 bpm    Heart Rate (Exercise)  137 bpm    Heart Rate (Exit)  68 bpm    Symptoms  none    Duration  Continue with 30 min of aerobic exercise without signs/symptoms of physical distress.    Intensity  THRR unchanged      Progression   Progression  Continue to progress workloads to maintain intensity without signs/symptoms of physical distress.    Average METs  2.3      Resistance Training   Training Prescription  Yes    Weight  3 lb    Reps  10-15      Interval Training   Interval Training  No      Treadmill   MPH  1.7    Grade  0    Minutes  15    METs  2.3      Elliptical   Level  1    Speed  3    Minutes  15       Nutrition:  Target Goals: Understanding of nutrition guidelines, daily intake of sodium <1569m, cholesterol <2077m calories 30% from fat and 7% or less from saturated fats, daily to have 5 or more servings of fruits and vegetables.  Biometrics: Pre Biometrics - 04/26/19 1159      Pre Biometrics   Height  _0  (1.905 m)    Weight  289 lb 4.8 oz (131.2 kg)    BMI (Calculated)  36.16    Single Leg Stand  15.47 seconds        Nutrition Therapy Plan and Nutrition Goals: Nutrition Therapy & Goals - 04/26/19 1139      Nutrition Therapy   Diet  Low Na, HH diet    Protein (specify units)  100g    Fiber  30 grams    Whole Grain Foods  3 servings    Saturated Fats  12 max. grams    Fruits and Vegetables  5 servings/day    Sodium  1.5 grams      Personal Nutrition Goals   Nutrition Goal  ST: decrease Na LT: increase EF and go back to work     Comments  Pt reports B: cheerios with 2% milk. L: (truck) ham and cheese sandwich with mayo and white bread or (home) leftovers from dinner. D: Chicken, beef, fish, and steak on weekends (typically meat is fried). Mix up the grain sometimes WG sometimes not (mac and cheese or brown rice for example). Pt reports not snacking often and that he drinks water during the day, but will have coffee with international delight creamer (too much per pt). Discussed HH and low Na eating.      Intervention Plan   Intervention  Prescribe, educate and counsel regarding individualized specific dietary modifications aiming towards targeted core components such as weight, hypertension, lipid management, diabetes, heart failure and other comorbidities.;Nutrition handout(s) given to patient.    Expected Outcomes  Short Term Goal: Understand basic principles of dietary content, such as calories, fat, sodium, cholesterol and nutrients.;Short Term Goal: A plan has been developed with personal nutrition goals set during dietitian appointment.;Long Term Goal: Adherence to prescribed nutrition plan.       Nutrition Assessments: Nutrition Assessments - 04/26/19 1157      MEDFICTS Scores   Pre Score  69       Nutrition Goals Re-Evaluation: Nutrition Goals Re-Evaluation    Row Name 05/16/19 1010             Goals   Nutrition Goal  ST: switch to [place] and whole wheat bread for lunch LT: increase EF and go back to work       Comment  Pt reports limiting Na. Pt would like to work on sandwich switching to [place] instead of ham and having whole wheat bread. Pt reports no concerns at this time but breathing and energy levels are the same.       Expected Outcome  ST: switch to [place] and whole wheat bread for lunch LT: increase EF and go back to work          Nutrition Goals Discharge (Final Nutrition Goals Re-Evaluation): Nutrition Goals Re-Evaluation - 05/16/19 1010      Goals   Nutrition Goal  ST: switch to [place]  and whole wheat bread for lunch LT: increase EF and go back to work    Comment  Pt reports limiting Na. Pt would like to work on sandwich switching to [place] instead of ham and having whole wheat bread. Pt reports no concerns at this time but breathing and energy levels are the same.    Expected Outcome  ST: switch to [place] and whole wheat bread for lunch LT: increase EF and go back to work       Psychosocial: Target Goals: Acknowledge presence or absence of significant depression  and/or stress, maximize coping skills, provide positive support system. Participant is able to verbalize types and ability to use techniques and skills needed for reducing stress and depression.   Initial Review & Psychosocial Screening: Initial Psych Review & Screening - 04/24/19 1548      Initial Review   Current issues with  Current Stress Concerns    Source of Stress Concerns  Occupation    Comments  Mr. Lardizabal can't return to work until his EF is at least higher than 40%. He has MD appointments coming up to reevaluate, he is hoping [facility] helps with that.      Family Dynamics   Good Support System?  Yes      Barriers   Psychosocial barriers to participate in program  There are no identifiable barriers or psychosocial needs.;The patient should benefit from training in stress management and relaxation.      Screening Interventions   Interventions  Encouraged to exercise;To provide support and resources with identified psychosocial needs;Provide feedback about the scores to participant    Expected Outcomes  Short Term goal: Utilizing psychosocial counselor, staff and physician to assist with identification of specific Stressors or current issues interfering with healing process. Setting desired goal for each stressor or current issue identified.;Long Term Goal: Stressors or current issues are controlled or eliminated.;Short Term goal: Identification and review with participant of any Quality of Life or  Depression concerns found by scoring the questionnaire.;Long Term goal: The participant improves quality of Life and PHQ9 Scores as seen by post scores and/or verbalization of changes       Quality of Life Scores:  Quality of Life - 04/26/19 1222      Quality of Life   Select  Quality of Life      Quality of Life Scores   Health/Function Pre  19.2 %    Socioeconomic Pre  23.13 %    Psych/Spiritual Pre  22.86 %    Family Pre  22.8 %    GLOBAL Pre  21.34 %      Scores of 19 and below usually indicate a poorer quality of life in these areas.  A difference of  2-3 points is a clinically meaningful difference.  A difference of 2-3 points in the total score of the Quality of Life Index has been associated with significant improvement in overall quality of life, self-image, physical symptoms, and general health in studies assessing change in quality of life.  PHQ-9: Recent Review Flowsheet Data    Depression screen [facility] 2/9 04/26/2019   Decreased Interest 1   Down, Depressed, Hopeless 0   PHQ - 2 Score 1   Altered sleeping 0   Tired, decreased energy 1   Change in appetite 0   Feeling bad or failure about yourself  0   Trouble concentrating 0   Moving slowly or fidgety/restless 0   Suicidal thoughts 0   PHQ-9 Score 2   Difficult doing work/chores Not difficult at all     Interpretation of Total Score  Total Score Depression Severity:  1-4 = Minimal depression, 5-9 = Mild depression, 10-14 = Moderate depression, 15-19 = Moderately severe depression, 20-27 = Severe depression   Psychosocial Evaluation and Intervention:   Psychosocial Re-Evaluation: Psychosocial Re-Evaluation    [place] Name 05/12/19 [id number]             Psychosocial Re-Evaluation   Current issues with  Current Stress Concerns       Comments  He likes work and  wants to get back to it. His goal is to increase his EF to where is will be cleared to go back to work. When asked about other stress concerns he states he  does not have any.       Expected Outcomes  Short: continue to increase EF by exercising. Long: return to work for his mental health.       Interventions  Encouraged to attend Cardiac Rehabilitation for the exercise       Continue Psychosocial Services   Follow up required by staff          Psychosocial Discharge (Final Psychosocial Re-Evaluation): Psychosocial Re-Evaluation - 05/12/19 0955      Psychosocial Re-Evaluation   Current issues with  Current Stress Concerns    Comments  He likes work and wants to get back to it. His goal is to increase his EF to where is will be cleared to go back to work. When asked about other stress concerns he states he does not have any.    Expected Outcomes  Short: continue to increase EF by exercising. Long: return to work for his mental health.    Interventions  Encouraged to attend Cardiac Rehabilitation for the exercise    Continue Psychosocial Services   Follow up required by staff       Vocational Rehabilitation: Provide vocational rehab assistance to qualifying candidates.   Vocational Rehab Evaluation & Intervention: Vocational Rehab - 04/24/19 1548      Initial Vocational Rehab Evaluation & Intervention   Assessment shows need for Vocational Rehabilitation  No       Education: Education Goals: Education classes will be provided on a variety of topics geared toward better understanding of heart health and risk factor modification. Participant will state understanding/return demonstration of topics presented as noted by education test scores.  Learning Barriers/Preferences: Learning Barriers/Preferences - 04/24/19 1548      Learning Barriers/Preferences   Learning Barriers  None    Learning Preferences  None       Education Topics:  AED/CPR: - Group verbal and written instruction with the use of models to demonstrate the basic use of the AED with the basic ABC's of resuscitation.   General Nutrition Guidelines/Fats and  Fiber: -Group instruction provided by verbal, written material, models and posters to present the general guidelines for heart healthy nutrition. Gives an explanation and review of dietary fats and fiber.   Controlling Sodium/Reading Food Labels: -Group verbal and written material supporting the discussion of sodium use in heart healthy nutrition. Review and explanation with models, verbal and written materials for utilization of the food label.   Exercise Physiology & General Exercise Guidelines: - Group verbal and written instruction with models to review the exercise physiology of the cardiovascular system and associated critical values. Provides general exercise guidelines with specific guidelines to those with heart or lung disease.    Aerobic Exercise & Resistance Training: - Gives group verbal and written instruction on the various components of exercise. Focuses on aerobic and resistive training programs and the benefits of this training and how to safely progress through these programs..   Flexibility, Balance, Mind/Body Relaxation: Provides group verbal/written instruction on the benefits of flexibility and balance training, including mind/body exercise modes such as yoga, pilates and tai chi.  Demonstration and skill practice provided.   Stress and Anxiety: - Provides group verbal and written instruction about the health risks of elevated stress and causes of high stress.  Discuss the correlation between heart/lung  disease and anxiety and treatment options. Review healthy ways to manage with stress and anxiety.   Depression: - Provides group verbal and written instruction on the correlation between heart/lung disease and depressed mood, treatment options, and the stigmas associated with seeking treatment.   Anatomy & Physiology of the Heart: - Group verbal and written instruction and models provide basic cardiac anatomy and physiology, with the coronary electrical and arterial  systems. Review of Valvular disease and Heart Failure   Cardiac Procedures: - Group verbal and written instruction to review commonly prescribed medications for heart disease. Reviews the medication, class of the drug, and side effects. Includes the steps to properly store meds and maintain the prescription regimen. (beta blockers and nitrates)   Cardiac Medications I: - Group verbal and written instruction to review commonly prescribed medications for heart disease. Reviews the medication, class of the drug, and side effects. Includes the steps to properly store meds and maintain the prescription regimen.   Cardiac Medications II: -Group verbal and written instruction to review commonly prescribed medications for heart disease. Reviews the medication, class of the drug, and side effects. (all other drug classes)    Go Sex-Intimacy & Heart Disease, Get SMART - Goal Setting: - Group verbal and written instruction through game format to discuss heart disease and the return to sexual intimacy. Provides group verbal and written material to discuss and apply goal setting through the application of the S.M.A.R.T. Method.   Other Matters of the Heart: - Provides group verbal, written materials and models to describe Stable Angina and Peripheral Artery. Includes description of the disease process and treatment options available to the cardiac patient.   Exercise & Equipment Safety: - Individual verbal instruction and demonstration of equipment use and safety with use of the equipment.   Cardiac Rehab from 04/26/2019 in [facility] Cardiac and Pulmonary Rehab  Date  04/26/19  Educator  AS  Instruction Review Code  1- Verbalizes Understanding      Infection Prevention: - Provides verbal and written material to individual with discussion of infection control including proper hand washing and proper equipment cleaning during exercise session.   Cardiac Rehab from 04/26/2019 in [facility] Cardiac and Pulmonary  Rehab  Date  04/26/19  Educator  AS  Instruction Review Code  1- Verbalizes Understanding      Falls Prevention: - Provides verbal and written material to individual with discussion of falls prevention and safety.   Cardiac Rehab from 04/26/2019 in [facility] Cardiac and Pulmonary Rehab  Date  04/26/19  Educator  AS  Instruction Review Code  1- Verbalizes Understanding      Diabetes: - Individual verbal and written instruction to review signs/symptoms of diabetes, desired ranges of glucose level fasting, after meals and with exercise. Acknowledge that pre and post exercise glucose checks will be done for 3 sessions at entry of program.   Know Your Numbers and Risk Factors: -Group verbal and written instruction about important numbers in your health.  Discussion of what are risk factors and how they play a role in the disease process.  Review of Cholesterol, Blood Pressure, Diabetes, and BMI and the role they play in your overall health.   Sleep Hygiene: -Provides group verbal and written instruction about how sleep can affect your health.  Define sleep hygiene, discuss sleep cycles and impact of sleep habits. Review good sleep hygiene tips.    Other: -Provides group and verbal instruction on various topics (see comments)   Knowledge Questionnaire Score: Knowledge Questionnaire Score -  04/26/19 1224      Knowledge Questionnaire Score   Pre Score  23/26       Core Components/Risk Factors/Patient Goals at Admission: Personal Goals and Risk Factors at Admission - 04/26/19 1206      Core Components/Risk Factors/Patient Goals on Admission    Weight Management  Yes    Intervention  Weight Management/Obesity: Establish reasonable short term and long term weight goals.    Admit Weight  289 lb 4.8 oz (131.2 kg)    Goal Weight: Short Term  279 lb (126.6 kg)    Goal Weight: Long Term  269 lb (122 kg)    Expected Outcomes  Short Term: Continue to assess and modify interventions until  short term weight is achieved;Long Term: Adherence to nutrition and physical activity/exercise program aimed toward attainment of established weight goal;Weight Loss: Understanding of general recommendations for a balanced deficit meal plan, which promotes 1-2 lb weight loss per week and includes a negative energy balance of [phone] kcal/d;Understanding recommendations for meals to include 15-35% energy as protein, 25-35% energy from fat, 35-60% energy from carbohydrates, less than 24m of dietary cholesterol, 20-35 gm of total fiber daily;Weight Gain: Understanding of general recommendations for a high calorie, high protein meal plan that promotes weight gain by distributing calorie intake throughout the day with the consumption for 4-5 meals, snacks, and/or supplements;Understanding of distribution of calorie intake throughout the day with the consumption of 4-5 meals/snacks    Intervention  Provide a combined exercise and nutrition program that is supplemented with education, support and counseling about heart failure. Directed toward relieving symptoms such as shortness of breath, decreased exercise tolerance, and extremity edema.    Expected Outcomes  Improve functional capacity of life;Short term: Attendance in program 2-3 days a week with increased exercise capacity. Reported lower sodium intake. Reported increased fruit and vegetable intake. Reports medication compliance.;Short term: Daily weights obtained and reported for increase. Utilizing diuretic protocols set by physician.;Long term: Adoption of self-care skills and reduction of barriers for early signs and symptoms recognition and intervention leading to self-care maintenance.    Intervention  Provide education on lifestyle modifcations including regular physical activity/exercise, weight management, moderate sodium restriction and increased consumption of fresh fruit, vegetables, and low fat dairy, alcohol moderation, and smoking  cessation.;Monitor prescription use compliance.    Expected Outcomes  Short Term: Continued assessment and intervention until BP is < 140/910mHG in hypertensive participants. < 130/8034mG in hypertensive participants with diabetes, heart failure or chronic kidney disease.;Long Term: Maintenance of blood pressure at goal levels.    Intervention  Provide education and support for participant on nutrition & aerobic/resistive exercise along with prescribed medications to achieve LDL <110m18mDL >40mg63m Expected Outcomes  Short Term: Participant states understanding of desired cholesterol values and is compliant with medications prescribed. Participant is following exercise prescription and nutrition guidelines.;Long Term: Cholesterol controlled with medications as prescribed, with individualized exercise RX and with personalized nutrition plan. Value goals: LDL < 110mg,26m > 40 mg.       Core Components/Risk Factors/Patient Goals Review:  Goals and Risk Factor Review    Row Name 05/12/19 0958             Core Components/Risk Factors/Patient Goals Review   Personal Goals Review  Weight Management/Obesity;Lipids;Hypertension;Heart Failure       Review  Patient wants to lose weight but has gained some since the start of the program. His blood pressure is a little high but  has not taken his medication this morning. He is going to start taking his blood pressure medications before he comes to [facility]. Informed patient to keep a three day diary of his foods to give to the Dietician.       Expected Outcomes  Short: lose 5 pounds and keep a three day food diary. Long: lose 15 pounds with exercise and diet post [facility].          Core Components/Risk Factors/Patient Goals at Discharge (Final Review):  Goals and Risk Factor Review - 05/12/19 0958      Core Components/Risk Factors/Patient Goals Review   Personal Goals Review  Weight Management/Obesity;Lipids;Hypertension;Heart Failure    Review   Patient wants to lose weight but has gained some since the start of the program. His blood pressure is a little high but has not taken his medication this morning. He is going to start taking his blood pressure medications before he comes to [facility]. Informed patient to keep a three day diary of his foods to give to the Dietician.    Expected Outcomes  Short: lose 5 pounds and keep a three day food diary. Long: lose 15 pounds with exercise and diet post [facility].       ITP Comments: ITP Comments    Row Name 04/24/19 1544 04/26/19 1144 05/03/19 0718 05/31/19 1329     ITP Comments  Virtual Initial Orientation compelted. Diagnosis can be found in [facility] 8/18. EP/RD Orientation scheduled for 9/2 at 10am  Initial 6MWT and nutrition eval completed.  ITP created and sent to Dr Sabra Heck Medical Director  30 Day Review Completed today. Continue with ITP unless changed by Medical Director review.  New to program  30 day review completed. ITP sent to Dr. Emily Filbert, Medical Director of Cardiac and Pulmonary Rehab. Continue with ITP unless changes are made by physician.  Department closed starting 10/2 until further notice by infection prevention and Health at Work teams for [place].       Comments: 30 day review

## 2019-06-06 ENCOUNTER — Other Ambulatory Visit: Payer: Self-pay

## 2019-06-06 ENCOUNTER — Encounter: Payer: BC Managed Care – PPO | Admitting: *Deleted

## 2019-06-06 DIAGNOSIS — Z955 Presence of coronary angioplasty implant and graft: Secondary | ICD-10-CM

## 2019-06-06 NOTE — Progress Notes (Signed)
Daily Session Note  Patient Details  Name: Mason Frank MRN: [id number] Date of Birth: [birth year] Referring Provider:     Cardiac Rehab from 04/26/2019 in [facility] Cardiac and Pulmonary Rehab  Referring Provider  [facility]      Encounter Date: 06/06/2019  Check In: Session Check In - 06/06/19 0948      Check-In   Supervising physician immediately available to respond to emergencies  See telemetry face sheet for immediately available ER MD    Location  [facility]-Cardiac & Pulmonary Rehab    Staff Present  Heath Lark, RN, BSN, CCRP;Laureen Owens Shark, BS, RRT, CPFT;Joseph [place] [employer]    Virtual Visit  No    Medication changes reported      No    Fall or balance concerns reported     No    Warm-up and Cool-down  Performed on first and last piece of equipment    Resistance Training Performed  Yes    VAD Patient?  No    PAD/SET Patient?  No      Pain Assessment   Currently in Pain?  No/denies          Social History   Tobacco Use  Smoking Status Former Smoker  . Quit date: 04/2018  . Years since quitting: 1.1  Smokeless Tobacco Never Used    Goals Met:  Independence with exercise equipment Exercise tolerated well No report of cardiac concerns or symptoms  Goals Unmet:  Not Applicable  Comments: Pt able to follow exercise prescription today without complaint.  Will continue to monitor for progression.    Dr. Emily Filbert is Medical Director for [place] and [facility].

## 2019-06-08 ENCOUNTER — Encounter: Payer: BC Managed Care – PPO | Admitting: *Deleted

## 2019-06-08 ENCOUNTER — Other Ambulatory Visit: Payer: Self-pay

## 2019-06-08 DIAGNOSIS — Z955 Presence of coronary angioplasty implant and graft: Secondary | ICD-10-CM | POA: Diagnosis not present

## 2019-06-08 NOTE — Progress Notes (Signed)
Daily Session Note  Patient Details  Name: Mason Frank MRN: [id number] Date of Birth: 1960-08-31 Referring Provider:     Cardiac Rehab from 04/26/2019 in [facility] Cardiac and Pulmonary Rehab  Referring Provider  [facility]      Encounter Date: 06/08/2019  Check In: Session Check In - 06/08/19 1002      Check-In   Supervising physician immediately available to respond to emergencies  See telemetry face sheet for immediately available ER MD    Location  [facility]-Cardiac & Pulmonary Rehab    Staff Present  Heath Lark, RN, BSN, CCRP;Jessica [place]ok, MA, RCEP, CCRP, CCET;Joseph Mart RCP,RRT,BSRT    Virtual Visit  No    Medication changes reported      No    Fall or balance concerns reported     No    Warm-up and Cool-down  Performed on first and last piece of equipment    Resistance Training Performed  Yes    VAD Patient?  No    PAD/SET Patient?  No      Pain Assessment   Currently in Pain?  No/denies          Social History   Tobacco Use  Smoking Status Former Smoker  . Quit date: 04/2018  . Years since quitting: 1.1  Smokeless Tobacco Never Used    Goals Met:  Independence with exercise equipment Exercise tolerated well No report of cardiac concerns or symptoms  Goals Unmet:  Not Applicable  Comments: Pt able to follow exercise prescription today without complaint.  Will continue to monitor for progression.  Reviewed home exercise with pt today.  Pt plans to walk and use staff videos at home for exercise.  Reviewed THR, pulse, RPE, sign and symptoms, NTG use, and when to call 911 or MD.  Also discussed weather considerations and indoor options.  Pt voiced understanding.   Dr. Emily Filbert is Medical Director for [place] and [facility].

## 2019-06-09 ENCOUNTER — Encounter: Payer: BC Managed Care – PPO | Admitting: *Deleted

## 2019-06-09 DIAGNOSIS — Z955 Presence of coronary angioplasty implant and graft: Secondary | ICD-10-CM | POA: Diagnosis not present

## 2019-06-09 NOTE — Progress Notes (Signed)
Daily Session Note  Patient Details  Name: Mason Frank MRN: [id number] Date of Birth: [birth year] Referring Provider:     Cardiac Rehab from 04/26/2019 in [facility] Cardiac and Pulmonary Rehab  Referring Provider  [facility]      Encounter Date: 06/09/2019  Check In: Session Check In - 06/09/19 0942      Check-In   Supervising physician immediately available to respond to emergencies  See telemetry face sheet for immediately available ER MD    Location  [facility]-Cardiac & Pulmonary Rehab    Staff Present  Renita Papa, RN BSN;Jessica Luan [place], MA, RCEP, CCRP, CCET;Amanda Sommer, [place], ACSM CEP, Exercise Physiologist    Virtual Visit  No    Medication changes reported      No    Fall or balance concerns reported     No    Warm-up and Cool-down  Performed on first and last piece of equipment    Resistance Training Performed  Yes    VAD Patient?  No    PAD/SET Patient?  No      Pain Assessment   Currently in Pain?  No/denies          Social History   Tobacco Use  Smoking Status Former Smoker  . Quit date: 04/2018  . Years since quitting: 1.1  Smokeless Tobacco Never Used    Goals Met:  Independence with exercise equipment Exercise tolerated well No report of cardiac concerns or symptoms Strength training completed today  Goals Unmet:  Not Applicable  Comments: Pt able to follow exercise prescription today without complaint.  Will continue to monitor for progression.    Dr. Emily Filbert is Medical Director for [place] and [facility].

## 2019-06-13 ENCOUNTER — Other Ambulatory Visit: Payer: Self-pay

## 2019-06-13 ENCOUNTER — Encounter: Payer: BC Managed Care – PPO | Admitting: *Deleted

## 2019-06-13 DIAGNOSIS — Z955 Presence of coronary angioplasty implant and graft: Secondary | ICD-10-CM

## 2019-06-13 NOTE — Progress Notes (Signed)
Daily Session Note  Patient Details  Name: Mason Frank MRN: [id number] Date of Birth: Nov 02, 1960 Referring Provider:     Cardiac Rehab from 04/26/2019 in [facility] Cardiac and Pulmonary Rehab  Referring Provider  [facility]      Encounter Date: 06/13/2019  Check In: Session Check In - 06/13/19 0949      Check-In   Supervising physician immediately available to respond to emergencies  See telemetry face sheet for immediately available ER MD    Location  [facility]-Cardiac & Pulmonary Rehab    Staff Present  Heath Lark, RN, BSN, CCRP;Amanda Sommer, BA, ACSM CEP, Exercise Physiologist;Joseph Hood RCP,RRT,BSRT    Virtual Visit  No    Medication changes reported      No    Fall or balance concerns reported     No    Warm-up and Cool-down  Performed on first and last piece of equipment    Resistance Training Performed  Yes    VAD Patient?  No    PAD/SET Patient?  No      Pain Assessment   Currently in Pain?  No/denies          Social History   Tobacco Use  Smoking Status Former Smoker  . Quit date: 04/2018  . Years since quitting: 1.1  Smokeless Tobacco Never Used    Goals Met:  Independence with exercise equipment Exercise tolerated well No report of cardiac concerns or symptoms  Goals Unmet:  Not Applicable  Comments: Pt able to follow exercise prescription today without complaint.  Will continue to monitor for progression.    Dr. Emily Filbert is Medical Director for [place] and [facility].

## 2019-06-15 ENCOUNTER — Encounter: Payer: BC Managed Care – PPO | Admitting: *Deleted

## 2019-06-15 ENCOUNTER — Other Ambulatory Visit: Payer: Self-pay

## 2019-06-15 DIAGNOSIS — Z955 Presence of coronary angioplasty implant and graft: Secondary | ICD-10-CM

## 2019-06-15 NOTE — Patient Instructions (Signed)
Discharge Patient Instructions  Patient Details  Name: Mason Frank MRN: [id number] Date of Birth: 1961-03-11 Referring Provider:  Yolonda Kida, MD   Number of Visits: 15  Reason for Discharge:  Early Exit:  Back to work  Smoking History:  Social History   Tobacco Use  Smoking Status Former Smoker  . Quit date: 04/2018  . Years since quitting: 1.1  Smokeless Tobacco Never Used    Diagnosis:  Status post coronary artery stent placement  Initial Exercise Prescription: Initial Exercise Prescription - 04/26/19 1100      Date of Initial Exercise RX and Referring Provider   Date  04/26/19    Referring Provider  [facility]      Treadmill   MPH  2.5    Grade  1    Minutes  15    METs  3.26      NuStep   Level  3    SPM  80    Minutes  15    METs  3      Elliptical   Level  1    Speed  3    Minutes  15      T5 Nustep   Level  2    SPM  80    Minutes  15    METs  3      Prescription Details   Frequency (times per week)  3    Duration  Progress to 30 minutes of continuous aerobic without signs/symptoms of physical distress      Intensity   THRR 40-80% of Max Heartrate  110-144    Ratings of Perceived Exertion  11-13    Perceived Dyspnea  0-4      Resistance Training   Training Prescription  Yes    Weight  3 lb    Reps  10-15       Discharge Exercise Prescription (Final Exercise Prescription Changes): Exercise Prescription Changes - 06/08/19 1000      Home Exercise Plan   Plans to continue exercise at  Home (comment)   walking, videos   Frequency  Add 2 additional days to program exercise sessions.    Initial Home Exercises Provided  06/08/19       Functional Capacity: 6 Minute Walk    Row Name 04/26/19 1146 06/15/19 1031       6 Minute Walk   Phase  Initial  Discharge    Distance  1312 feet  1378 feet    Distance % Change  -  5 %    Distance Feet Change  -  66 ft    Walk Time  6 minutes  6 minutes    # of Rest Breaks  0  0    MPH   2.48  2.6    METS  3.47  2.89    RPE  12  12    Perceived Dyspnea   1  -    VO2 Peak  12.15  10.14    Symptoms  No  No    Resting HR  76 bpm  85 bpm    Resting BP  134/68  136/70    Resting Oxygen Saturation   96 %  -    Exercise Oxygen Saturation  during 6 min walk  96 %  -    Max Ex. HR  103 bpm  101 bpm    Max Ex. BP  188/70  130/60    2 Minute Post BP  144/64  -       Quality of Life: Quality of Life - 04/26/19 1222      Quality of Life   Select  Quality of Life      Quality of Life Scores   Health/Function Pre  19.2 %    Socioeconomic Pre  23.13 %    Psych/Spiritual Pre  22.86 %    Family Pre  22.8 %    GLOBAL Pre  21.34 %       Personal Goals: Goals established at orientation with interventions provided to work toward goal. Personal Goals and Risk Factors at Admission - 04/26/19 1206      Core Components/Risk Factors/Patient Goals on Admission    Weight Management  Yes    Intervention  Weight Management/Obesity: Establish reasonable short term and long term weight goals.    Admit Weight  289 lb 4.8 oz (131.2 kg)    Goal Weight: Short Term  279 lb (126.6 kg)    Goal Weight: Long Term  269 lb (122 kg)    Expected Outcomes  Short Term: Continue to assess and modify interventions until short term weight is achieved;Long Term: Adherence to nutrition and physical activity/exercise program aimed toward attainment of established weight goal;Weight Loss: Understanding of general recommendations for a balanced deficit meal plan, which promotes 1-2 lb weight loss per week and includes a negative energy balance of [phone] kcal/d;Understanding recommendations for meals to include 15-35% energy as protein, 25-35% energy from fat, 35-60% energy from carbohydrates, less than 236m of dietary cholesterol, 20-35 gm of total fiber daily;Weight Gain: Understanding of general recommendations for a high calorie, high protein meal plan that promotes weight gain by distributing calorie  intake throughout the day with the consumption for 4-5 meals, snacks, and/or supplements;Understanding of distribution of calorie intake throughout the day with the consumption of 4-5 meals/snacks    Intervention  Provide a combined exercise and nutrition program that is supplemented with education, support and counseling about heart failure. Directed toward relieving symptoms such as shortness of breath, decreased exercise tolerance, and extremity edema.    Expected Outcomes  Improve functional capacity of life;Short term: Attendance in program 2-3 days a week with increased exercise capacity. Reported lower sodium intake. Reported increased fruit and vegetable intake. Reports medication compliance.;Short term: Daily weights obtained and reported for increase. Utilizing diuretic protocols set by physician.;Long term: Adoption of self-care skills and reduction of barriers for early signs and symptoms recognition and intervention leading to self-care maintenance.    Intervention  Provide education on lifestyle modifcations including regular physical activity/exercise, weight management, moderate sodium restriction and increased consumption of fresh fruit, vegetables, and low fat dairy, alcohol moderation, and smoking cessation.;Monitor prescription use compliance.    Expected Outcomes  Short Term: Continued assessment and intervention until BP is < 140/916mHG in hypertensive participants. < 130/8081mG in hypertensive participants with diabetes, heart failure or chronic kidney disease.;Long Term: Maintenance of blood pressure at goal levels.    Intervention  Provide education and support for participant on nutrition & aerobic/resistive exercise along with prescribed medications to achieve LDL <93m15mDL >40mg19m Expected Outcomes  Short Term: Participant states understanding of desired cholesterol values and is compliant with medications prescribed. Participant is following exercise prescription and nutrition  guidelines.;Long Term: Cholesterol controlled with medications as prescribed, with individualized exercise RX and with personalized nutrition plan. Value goals: LDL < 93mg,75m > 40 mg.        Personal Goals  Discharge: Goals and Risk Factor Review - 05/12/19 0958      Core Components/Risk Factors/Patient Goals Review   Personal Goals Review  Weight Management/Obesity;Lipids;Hypertension;Heart Failure    Review  Patient wants to lose weight but has gained some since the start of the program. His blood pressure is a little high but has not taken his medication this morning. He is going to start taking his blood pressure medications before he comes to [facility]. Informed patient to keep a three day diary of his foods to give to the Dietician.    Expected Outcomes  Short: lose 5 pounds and keep a three day food diary. Long: lose 15 pounds with exercise and diet post [facility].       Exercise Goals and Review: Exercise Goals    Row Name 04/26/19 1157             Exercise Goals   Increase Physical Activity  Yes       Intervention  Provide advice, education, support and counseling about physical activity/exercise needs.;Develop an individualized exercise prescription for aerobic and resistive training based on initial evaluation findings, risk stratification, comorbidities and participant's personal goals.       Expected Outcomes  Short Term: Attend rehab on a regular basis to increase amount of physical activity.;Long Term: Add in home exercise to make exercise part of routine and to increase amount of physical activity.;Long Term: Exercising regularly at least 3-5 days a week.       Increase Strength and Stamina  Yes       Intervention  Provide advice, education, support and counseling about physical activity/exercise needs.;Develop an individualized exercise prescription for aerobic and resistive training based on initial evaluation findings, risk stratification, comorbidities and  participant's personal goals.       Expected Outcomes  Short Term: Increase workloads from initial exercise prescription for resistance, speed, and METs.;Short Term: Perform resistance training exercises routinely during rehab and add in resistance training at home;Long Term: Improve cardiorespiratory fitness, muscular endurance and strength as measured by increased METs and functional capacity (6MWT)       Able to understand and use rate of perceived exertion (RPE) scale  Yes       Intervention  Provide education and explanation on how to use RPE scale       Expected Outcomes  Short Term: Able to use RPE daily in rehab to express subjective intensity level;Long Term:  Able to use RPE to guide intensity level when exercising independently       Able to understand and use Dyspnea scale  Yes       Intervention  Provide education and explanation on how to use Dyspnea scale       Expected Outcomes  Short Term: Able to use Dyspnea scale daily in rehab to express subjective sense of shortness of breath during exertion;Long Term: Able to use Dyspnea scale to guide intensity level when exercising independently       Knowledge and understanding of Target Heart Rate Range (THRR)  Yes       Intervention  Provide education and explanation of THRR including how the numbers were predicted and where they are located for reference       Expected Outcomes  Short Term: Able to state/look up THRR;Short Term: Able to use daily as guideline for intensity in rehab;Long Term: Able to use THRR to govern intensity when exercising independently       Able to check pulse independently  Yes  Intervention  Provide education and demonstration on how to check pulse in carotid and radial arteries.;Review the importance of being able to check your own pulse for safety during independent exercise       Expected Outcomes  Short Term: Able to explain why pulse checking is important during independent exercise;Long Term: Able to check  pulse independently and accurately       Understanding of Exercise Prescription  Yes       Intervention  Provide education, explanation, and written materials on patient's individual exercise prescription       Expected Outcomes  Short Term: Able to explain program exercise prescription;Long Term: Able to explain home exercise prescription to exercise independently          Exercise Goals Re-Evaluation: Exercise Goals Re-Evaluation    Row Name 05/12/19 0948 05/25/19 1328 06/06/19 1439 06/08/19 1056       Exercise Goal Re-Evaluation   Exercise Goals Review  Increase Physical Activity;Increase Strength and Stamina  Increase Physical Activity;Increase Strength and Stamina;Able to understand and use rate of perceived exertion (RPE) scale;Knowledge and understanding of Target Heart Rate Range (THRR);Able to check pulse independently;Understanding of Exercise Prescription  Increase Physical Activity;Increase Strength and Stamina;Understanding of Exercise Prescription  Able to understand and use rate of perceived exertion (RPE) scale;Increase Physical Activity;Increase Strength and Stamina;Knowledge and understanding of Target Heart Rate Range (THRR);Able to check pulse independently;Understanding of Exercise Prescription    Comments  He feels like the exercise is difficult to do in the program. 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 Talked to patient about possibly doing Saturday and Sunday when he is off work to exercise. Informed him that he could walk or do whatever exercises he can at home.  Josph Macho has attended consistently and works in [employer] range.  Staff will review home exercise.  Josph Macho has been doing well in rehab.  He had an episode of high blood pressure on the elliptical. We will continue to monitor his progress.  Reviewed home exercise with pt today.  Pt plans to walk and use staff videos at home for exercise.  Reviewed THR, pulse, RPE, sign and symptoms, NTG use, and when to call  911 or MD.  Also discussed weather considerations and indoor options.  Pt voiced understanding.    Expected Outcomes  Short: continue to exercise in [facility]. Long: go over home exercise information.  Short - continue to attend consistently Long :- improve overall fitness  Short: Continue to increase workloads as pressures allow. Long: Continue to improve stamina.  Short: Start to add in more walking at home.  Long: Continue to improve stamina and breathing.       Nutrition & Weight - Outcomes: Pre Biometrics - 04/26/19 1159      Pre Biometrics   Height  _0  (1.905 m)    Weight  289 lb 4.8 oz (131.2 kg)    BMI (Calculated)  36.16    Single Leg Stand  15.47 seconds        Nutrition: Nutrition Therapy & Goals - 04/26/19 1139      Nutrition Therapy   Diet  Low Na, HH diet    Protein (specify units)  100g    Fiber  30 grams    Whole Grain Foods  3 servings    Saturated Fats  12 max. grams    Fruits and Vegetables  5 servings/day    Sodium  1.5 grams      Personal  Nutrition Goals   Nutrition Goal  ST: decrease Na LT: increase EF and go back to work    Comments  Pt reports B: cheerios with 2% milk. L: (truck) ham and cheese sandwich with mayo and white bread or (home) leftovers from dinner. D: Chicken, beef, fish, and steak on weekends (typically meat is fried). Mix up the grain sometimes WG sometimes not (mac and cheese or brown rice for example). Pt reports not snacking often and that he drinks water during the day, but will have coffee with international delight creamer (too much per pt). Discussed HH and low Na eating.      Intervention Plan   Intervention  Prescribe, educate and counsel regarding individualized specific dietary modifications aiming towards targeted core components such as weight, hypertension, lipid management, diabetes, heart failure and other comorbidities.;Nutrition handout(s) given to patient.    Expected Outcomes  Short Term Goal: Understand basic  principles of dietary content, such as calories, fat, sodium, cholesterol and nutrients.;Short Term Goal: A plan has been developed with personal nutrition goals set during dietitian appointment.;Long Term Goal: Adherence to prescribed nutrition plan.       Nutrition Discharge: Nutrition Assessments - 06/15/19 1015      MEDFICTS Scores   Post Score  34       Education Questionnaire Score: Knowledge Questionnaire Score - 06/15/19 1015      Knowledge Questionnaire Score   Post Score  24/26       Goals reviewed with patient; copy given to patient.

## 2019-06-15 NOTE — Progress Notes (Signed)
Discharge Progress Report  Patient Details  Name: Mason Frank MRN: [id number] Date of Birth: February 03, 1961 Referring Provider:     Cardiac Rehab from 04/26/2019 in [facility] Cardiac and Pulmonary Rehab  Referring Provider  [facility]       Number of Visits: 15/36  Reason for Discharge:  Early Exit:  Back to work  Smoking History:  Social History   Tobacco Use  Smoking Status Former Smoker  . Quit date: 04/2018  . Years since quitting: 1.1  Smokeless Tobacco Never Used    Diagnosis:  Status post coronary artery stent placement  ADL UCSD:   Initial Exercise Prescription: Initial Exercise Prescription - 04/26/19 1100      Date of Initial Exercise RX and Referring Provider   Date  04/26/19    Referring Provider  [facility]      Treadmill   MPH  2.5    Grade  1    Minutes  15    METs  3.26      NuStep   Level  3    SPM  80    Minutes  15    METs  3      Elliptical   Level  1    Speed  3    Minutes  15      T5 Nustep   Level  2    SPM  80    Minutes  15    METs  3      Prescription Details   Frequency (times per week)  3    Duration  Progress to 30 minutes of continuous aerobic without signs/symptoms of physical distress      Intensity   THRR 40-80% of Max Heartrate  110-144    Ratings of Perceived Exertion  11-13    Perceived Dyspnea  0-4      Resistance Training   Training Prescription  Yes    Weight  3 lb    Reps  10-15       Discharge Exercise Prescription (Final Exercise Prescription Changes): Exercise Prescription Changes - 06/08/19 1000      Home Exercise Plan   Plans to continue exercise at  Home (comment)   walking, videos   Frequency  Add 2 additional days to program exercise sessions.    Initial Home Exercises Provided  06/08/19       Functional Capacity: 6 Minute Walk    Row Name 04/26/19 1146 06/15/19 1031       6 Minute Walk   Phase  Initial  Discharge    Distance  1312 feet  1378 feet    Distance % Change  -  5 %     Distance Feet Change  -  66 ft    Walk Time  6 minutes  6 minutes    # of Rest Breaks  0  0    MPH  2.48  2.6    METS  3.47  2.89    RPE  12  12    Perceived Dyspnea   1  -    VO2 Peak  12.15  10.14    Symptoms  No  No    Resting HR  76 bpm  85 bpm    Resting BP  134/68  136/70    Resting Oxygen Saturation   96 %  -    Exercise Oxygen Saturation  during 6 min walk  96 %  -    Max Ex. HR  103  bpm  101 bpm    Max Ex. BP  188/70  130/60    2 Minute Post BP  144/64  -       Psychological, QOL, Others - Outcomes: PHQ 2/9: Depression screen [facility] 2/9 06/15/2019 04/26/2019  Decreased Interest 0 1  Down, Depressed, Hopeless 0 0  PHQ - 2 Score 0 1  Altered sleeping 0 0  Tired, decreased energy 1 1  Change in appetite 1 0  Feeling bad or failure about yourself  0 0  Trouble concentrating 0 0  Moving slowly or fidgety/restless 0 0  Suicidal thoughts 0 0  PHQ-9 Score 2 2  Difficult doing work/chores Not difficult at all Not difficult at all    Quality of Life: Quality of Life - 06/15/19 1135      Quality of Life Scores   Health/Function Post  21.83 %    Socioeconomic Post  22.5 %    Psych/Spiritual Post  22.71 %    Family Pre  25 %    GLOBAL Post  22.62 %       Personal Goals: Goals established at orientation with interventions provided to work toward goal. Personal Goals and Risk Factors at Admission - 04/26/19 1206      Core Components/Risk Factors/Patient Goals on Admission    Weight Management  Yes    Intervention  Weight Management/Obesity: Establish reasonable short term and long term weight goals.    Admit Weight  289 lb 4.8 oz (131.2 kg)    Goal Weight: Short Term  279 lb (126.6 kg)    Goal Weight: Long Term  269 lb (122 kg)    Expected Outcomes  Short Term: Continue to assess and modify interventions until short term weight is achieved;Long Term: Adherence to nutrition and physical activity/exercise program aimed toward attainment of established weight goal;Weight  Loss: Understanding of general recommendations for a balanced deficit meal plan, which promotes 1-2 lb weight loss per week and includes a negative energy balance of [phone] kcal/d;Understanding recommendations for meals to include 15-35% energy as protein, 25-35% energy from fat, 35-60% energy from carbohydrates, less than 238m of dietary cholesterol, 20-35 gm of total fiber daily;Weight Gain: Understanding of general recommendations for a high calorie, high protein meal plan that promotes weight gain by distributing calorie intake throughout the day with the consumption for 4-5 meals, snacks, and/or supplements;Understanding of distribution of calorie intake throughout the day with the consumption of 4-5 meals/snacks    Intervention  Provide a combined exercise and nutrition program that is supplemented with education, support and counseling about heart failure. Directed toward relieving symptoms such as shortness of breath, decreased exercise tolerance, and extremity edema.    Expected Outcomes  Improve functional capacity of life;Short term: Attendance in program 2-3 days a week with increased exercise capacity. Reported lower sodium intake. Reported increased fruit and vegetable intake. Reports medication compliance.;Short term: Daily weights obtained and reported for increase. Utilizing diuretic protocols set by physician.;Long term: Adoption of self-care skills and reduction of barriers for early signs and symptoms recognition and intervention leading to self-care maintenance.    Intervention  Provide education on lifestyle modifcations including regular physical activity/exercise, weight management, moderate sodium restriction and increased consumption of fresh fruit, vegetables, and low fat dairy, alcohol moderation, and smoking cessation.;Monitor prescription use compliance.    Expected Outcomes  Short Term: Continued assessment and intervention until BP is < 140/941mHG in hypertensive participants.  < 130/8027mG in hypertensive participants with diabetes, heart  failure or chronic kidney disease.;Long Term: Maintenance of blood pressure at goal levels.    Intervention  Provide education and support for participant on nutrition & aerobic/resistive exercise along with prescribed medications to achieve LDL <23m, HDL >420m    Expected Outcomes  Short Term: Participant states understanding of desired cholesterol values and is compliant with medications prescribed. Participant is following exercise prescription and nutrition guidelines.;Long Term: Cholesterol controlled with medications as prescribed, with individualized exercise RX and with personalized nutrition plan. Value goals: LDL < 7042mHDL > 40 mg.        Personal Goals Discharge: Goals and Risk Factor Review    Row Name 05/12/19 0958             Core Components/Risk Factors/Patient Goals Review   Personal Goals Review  Weight Management/Obesity;Lipids;Hypertension;Heart Failure       Review  Patient wants to lose weight but has gained some since the start of the program. His blood pressure is a little high but has not taken his medication this morning. He is going to start taking his blood pressure medications [facility] to keep a three day diary of his foods to give to the Dietician.       Expected Outcomes  Short: lose 5 pounds and keep a three day food diary. Long: lose 15 pounds with exercise and diet post [facility].          Exercise Goals and Review: Exercise Goals    Row Name 04/26/19 1157             Exercise Goals   Increase Physical Activity  Yes       Intervention  Provide advice, education, support and counseling about physical activity/exercise needs.;Develop an individualized exercise prescription for aerobic and resistive training based on initial evaluation findings, risk stratification, comorbidities and participant's personal goals.       Expected Outcomes  Short Term:  Attend rehab on a regular basis to increase amount of physical activity.;Long Term: Add in home exercise to make exercise part of routine and to increase amount of physical activity.;Long Term: Exercising regularly at least 3-5 days a week.       Increase Strength and Stamina  Yes       Intervention  Provide advice, education, support and counseling about physical activity/exercise needs.;Develop an individualized exercise prescription for aerobic and resistive training based on initial evaluation findings, risk stratification, comorbidities and participant's personal goals.       Expected Outcomes  Short Term: Increase workloads from initial exercise prescription for resistance, speed, and METs.;Short Term: Perform resistance training exercises routinely during rehab and add in resistance training at home;Long Term: Improve cardiorespiratory fitness, muscular endurance and strength as measured by increased METs and functional capacity (6MWT)       Able to understand and use rate of perceived exertion (RPE) scale  Yes       Intervention  Provide education and explanation on how to use RPE scale       Expected Outcomes  Short Term: Able to use RPE daily in rehab to express subjective intensity level;Long Term:  Able to use RPE to guide intensity level when exercising independently       Able to understand and use Dyspnea scale  Yes       Intervention  Provide education and explanation on how to use Dyspnea scale       Expected Outcomes  Short Term: Able to use Dyspnea scale  daily in rehab to express subjective sense of shortness of breath during exertion;Long Term: Able to use Dyspnea scale to guide intensity level when exercising independently       Knowledge and understanding of Target Heart Rate Range (THRR)  Yes       Intervention  Provide education and explanation of THRR including how the numbers were predicted and where they are located for reference       Expected Outcomes  Short Term: Able to  state/look up THRR;Short Term: Able to use daily as guideline for intensity in rehab;Long Term: Able to use THRR to govern intensity when exercising independently       Able to check pulse independently  Yes       Intervention  Provide education and demonstration on how to check pulse in carotid and radial arteries.;Review the importance of being able to check your own pulse for safety during independent exercise       Expected Outcomes  Short Term: Able to explain why pulse checking is important during independent exercise;Long Term: Able to check pulse independently and accurately       Understanding of Exercise Prescription  Yes       Intervention  Provide education, explanation, and written materials on patient's individual exercise prescription       Expected Outcomes  Short Term: Able to explain program exercise prescription;Long Term: Able to explain home exercise prescription to exercise independently          Exercise Goals Re-Evaluation: Exercise Goals Re-Evaluation    Row Name 05/12/19 0948 05/25/19 1328 06/06/19 1439 06/08/19 1056       Exercise Goal Re-Evaluation   Exercise Goals Review  Increase Physical Activity;Increase Strength and Stamina  Increase Physical Activity;Increase Strength and Stamina;Able to understand and use rate of perceived exertion (RPE) scale;Knowledge and understanding of Target Heart Rate Range (THRR);Able to check pulse independently;Understanding of Exercise Prescription  Increase Physical Activity;Increase Strength and Stamina;Understanding of Exercise Prescription  Able to understand and use rate of perceived exertion (RPE) scale;Increase Physical Activity;Increase Strength and Stamina;Knowledge and understanding of Target Heart Rate Range (THRR);Able to check pulse independently;Understanding of Exercise Prescription    Comments  He feels like the exercise is difficult to do in the program. 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 Talked to  patient about possibly doing Saturday and Sunday when he is off work to exercise. Informed him that he could walk or do whatever exercises he can at home.  Josph Macho has attended consistently and works in [employer] range.  Staff will review home exercise.  Josph Macho has been doing well in rehab.  He had an episode of high blood pressure on the elliptical. We will continue to monitor his progress.  Reviewed home exercise with pt today.  Pt plans to walk and use staff videos at home for exercise.  Reviewed THR, pulse, RPE, sign and symptoms, NTG use, and when to call 911 or MD.  Also discussed weather considerations and indoor options.  Pt voiced understanding.    Expected Outcomes  Short: continue to exercise in [facility]. Long: go over home exercise information.  Short - continue to attend consistently Long :- improve overall fitness  Short: Continue to increase workloads as pressures allow. Long: Continue to improve stamina.  Short: Start to add in more walking at home.  Long: Continue to improve stamina and breathing.       Nutrition & Weight - Outcomes: Pre Biometrics - 04/26/19 1159  Pre Biometrics   Height  _0  (1.905 m)    Weight  289 lb 4.8 oz (131.2 kg)    BMI (Calculated)  36.16    Single Leg Stand  15.47 seconds        Nutrition: Nutrition Therapy & Goals - 04/26/19 1139      Nutrition Therapy   Diet  Low Na, HH diet    Protein (specify units)  100g    Fiber  30 grams    Whole Grain Foods  3 servings    Saturated Fats  12 max. grams    Fruits and Vegetables  5 servings/day    Sodium  1.5 grams      Personal Nutrition Goals   Nutrition Goal  ST: decrease Na LT: increase EF and go back to work    Comments  Pt reports B: cheerios with 2% milk. L: (truck) ham and cheese sandwich with mayo and white bread or (home) leftovers from dinner. D: Chicken, beef, fish, and steak on weekends (typically meat is fried). Mix up the grain sometimes WG sometimes not (mac and cheese or brown rice for  example). Pt reports not snacking often and that he drinks water during the day, but will have coffee with international delight creamer (too much per pt). Discussed HH and low Na eating.      Intervention Plan   Intervention  Prescribe, educate and counsel regarding individualized specific dietary modifications aiming towards targeted core components such as weight, hypertension, lipid management, diabetes, heart failure and other comorbidities.;Nutrition handout(s) given to patient.    Expected Outcomes  Short Term Goal: Understand basic principles of dietary content, such as calories, fat, sodium, cholesterol and nutrients.;Short Term Goal: A plan has been developed with personal nutrition goals set during dietitian appointment.;Long Term Goal: Adherence to prescribed nutrition plan.       Nutrition Discharge: Nutrition Assessments - 06/15/19 1015      MEDFICTS Scores   Post Score  34       Education Questionnaire Score: Knowledge Questionnaire Score - 06/15/19 1015      Knowledge Questionnaire Score   Post Score  24/26       Goals reviewed with patient; copy given to patient.

## 2019-06-15 NOTE — Progress Notes (Signed)
Daily Session Note  Patient Details  Name: Mason Frank MRN: [id number] Date of Birth: 03-26-1961 Referring Provider:     Cardiac Rehab from 04/26/2019 in [facility] Cardiac and Pulmonary Rehab  Referring Provider  [facility]      Encounter Date: 06/15/2019  Check In: Session Check In - 06/15/19 0955      Check-In   Supervising physician immediately available to respond to emergencies  See telemetry face sheet for immediately available ER MD    Location  [facility]-Cardiac & Pulmonary Rehab    Staff Present  Heath Lark, RN, BSN, CCRP;Jeanna Durrell BS, Exercise Physiologist;Joseph Hood RCP,RRT,BSRT    Virtual Visit  No    Medication changes reported      No    Fall or balance concerns reported     No    Warm-up and Cool-down  Performed on first and last piece of equipment    Resistance Training Performed  Yes    VAD Patient?  No    PAD/SET Patient?  No      Pain Assessment   Currently in Pain?  No/denies          Social History   Tobacco Use  Smoking Status Former Smoker  . Quit date: 04/2018  . Years since quitting: 1.1  Smokeless Tobacco Never Used    Goals Met:  Independence with exercise equipment Exercise tolerated well No report of cardiac concerns or symptoms  Goals Unmet:  Not Applicable  Comments: Mason Frank graduated today from  rehab with 15/36 sessions completed.  Details of the patient's exercise prescription and what He needs to do in order to continue the prescription and progress were discussed with patient.  Patient was given a copy of prescription and goals.  Patient verbalized understanding.  Mason Frank plans to continue to exercise bywalking.   Dr. Emily Filbert is Medical Director for [facility].

## 2019-06-15 NOTE — Progress Notes (Signed)
Cardiac Individual Treatment Plan  Patient Details  Name: Mason Frank MRN: [id number] Date of Birth: 1960-11-11 Referring Provider:     Cardiac Rehab from 04/26/2019 in [facility] Cardiac and Pulmonary Rehab  Referring Provider  [facility]      Initial Encounter Date:    Cardiac Rehab from 04/26/2019 in [facility] Cardiac and Pulmonary Rehab  Date  04/26/19      Visit Diagnosis: Status post coronary artery stent placement  Patient's Home Medications on Admission:  Current Outpatient Medications:  .  clopidogrel (PLAVIX) 300 MG TABS tablet, Take by mouth., Disp: , Rfl:  .  allopurinol (ZYLOPRIM) 100 MG tablet, Take 100 mg by mouth daily at 12 noon., Disp: , Rfl:  .  aspirin EC 81 MG tablet, Take 81 mg by mouth daily at 12 noon., Disp: , Rfl:  .  colchicine 0.6 MG tablet, Take 0.6 mg by mouth daily as needed (gout flares)., Disp: , Rfl:  .  furosemide (LASIX) 20 MG tablet, Take 20 mg by mouth daily at 12 noon., Disp: , Rfl:  .  losartan (COZAAR) 100 MG tablet, Take 100 mg by mouth daily at 12 noon., Disp: , Rfl:  .  metoprolol succinate (TOPROL-XL) 25 MG 24 hr tablet, Take 12.5 mg by mouth daily at 12 noon., Disp: , Rfl:  .  naproxen sodium (ALEVE) 220 MG tablet, Take 440 mg by mouth daily as needed (pain)., Disp: , Rfl:  .  potassium chloride (KLOR-CON) 20 MEQ packet, Take 20 mEq by mouth daily at 12 noon., Disp: , Rfl:  .  rosuvastatin (CRESTOR) 20 MG tablet, Take by mouth., Disp: , Rfl:   Past Medical History: No past medical history on file.  Tobacco Use: Social History   Tobacco Use  Smoking Status Former Smoker  . Quit date: 04/2018  . Years since quitting: 1.1  Smokeless Tobacco Never Used    Labs: Recent Review Scientist, physiological    Labs for ITP Cardiac and Pulmonary Rehab Latest Ref Rng & Units 10/17/2012   Cholestrol 0 - 200 mg/dL 239(H)   LDLCALC 0 - 100 mg/dL 164(H)   HDL 40 - 60 mg/dL 59   Trlycerides 0 - 200 mg/dL 81       Exercise Target Goals: Exercise Program  Goal: Individual exercise prescription set using results from initial 6 min walk test and THRR while considering  patient's activity barriers and safety.   Exercise Prescription Goal: Initial exercise prescription builds to 30-45 minutes a day of aerobic activity, 2-3 days per week.  Home exercise guidelines will be given to patient during program as part of exercise prescription that the participant will acknowledge.  Activity Barriers & Risk Stratification: Activity Barriers & Cardiac Risk Stratification - 04/24/19 1537      Activity Barriers & Cardiac Risk Stratification   Activity Barriers  Shortness of Breath    Cardiac Risk Stratification  High       6 Minute Walk: 6 Minute Walk    Row Name 04/26/19 1146 06/15/19 1031       6 Minute Walk   Phase  Initial  Discharge    Distance  1312 feet  1378 feet    Distance % Change  -  5 %    Distance Feet Change  -  66 ft    Walk Time  6 minutes  6 minutes    # of Rest Breaks  0  0    MPH  2.48  2.6    METS  3.47  2.89    RPE  12  12    Perceived Dyspnea   1  -    VO2 Peak  12.15  10.14    Symptoms  No  No    Resting HR  76 bpm  85 bpm    Resting BP  134/68  136/70    Resting Oxygen Saturation   96 %  -    Exercise Oxygen Saturation  during 6 min walk  96 %  -    Max Ex. HR  103 bpm  101 bpm    Max Ex. BP  188/70  130/60    2 Minute Post BP  144/64  -       Oxygen Initial Assessment:   Oxygen Re-Evaluation:   Oxygen Discharge (Final Oxygen Re-Evaluation):   Initial Exercise Prescription: Initial Exercise Prescription - 04/26/19 1100      Date of Initial Exercise RX and Referring Provider   Date  04/26/19    Referring Provider  [facility]      Treadmill   MPH  2.5    Grade  1    Minutes  15    METs  3.26      NuStep   Level  3    SPM  80    Minutes  15    METs  3      Elliptical   Level  1    Speed  3    Minutes  15      T5 Nustep   Level  2    SPM  80    Minutes  15    METs  3       Prescription Details   Frequency (times per week)  3    Duration  Progress to 30 minutes of continuous aerobic without signs/symptoms of physical distress      Intensity   THRR 40-80% of Max Heartrate  110-144    Ratings of Perceived Exertion  11-13    Perceived Dyspnea  0-4      Resistance Training   Training Prescription  Yes    Weight  3 lb    Reps  10-15       Perform Capillary Blood Glucose checks as needed.  Exercise Prescription Changes: Exercise Prescription Changes    Row Name 04/26/19 1100 05/25/19 1300 06/07/19 1100 06/08/19 1000       Response to Exercise   Blood Pressure (Admit)  134/68  124/58  142/70  -    Blood Pressure (Exercise)  180/70  196/84  152/72  -    Blood Pressure (Exit)  144/64  148/74  140/76  -    Heart Rate (Admit)  76 bpm  76 bpm  76 bpm  -    Heart Rate (Exercise)  103 bpm  137 bpm  102 bpm  -    Heart Rate (Exit)  79 bpm  68 bpm  68 bpm  -    Oxygen Saturation (Admit)  96 %  -  -  -    Oxygen Saturation (Exercise)  96 %  -  -  -    Rating of Perceived Exertion (Exercise)  12  -  13  -    Perceived Dyspnea (Exercise)  1  -  -  -    Symptoms  none  none  none  -    Duration  -  Continue with 30 min of aerobic exercise without signs/symptoms of physical  distress.  Continue with 30 min of aerobic exercise without signs/symptoms of physical distress.  -    Intensity  -  THRR unchanged  THRR unchanged  -      Progression   Progression  -  Continue to progress workloads to maintain intensity without signs/symptoms of physical distress.  Continue to progress workloads to maintain intensity without signs/symptoms of physical distress.  -    Average METs  -  2.3  2.9  -      Resistance Training   Training Prescription  -  Yes  Yes  -    Weight  -  3 lb  3 lbs  -    Reps  -  10-15  10-15  -      Interval Training   Interval Training  -  No  No  -      Treadmill   MPH  -  1.7  1.7  -    Grade  -  0  0  -    Minutes  -  15  15  -    METs  -   2.3  2.3  -      Recumbant Bike   Level  -  -  5  -    Minutes  -  -  15  -      Elliptical   Level  -  1  1  -    Speed  -  3  2.5  -    Minutes  -  15  15 intermitten  -      REL-XR   Level  -  -  3  -    Minutes  -  -  15  -    METs  -  -  4.2  -      Home Exercise Plan   Plans to continue exercise at  -  -  -  Home (comment) walking, videos    Frequency  -  -  -  Add 2 additional days to program exercise sessions.    Initial Home Exercises Provided  -  -  -  06/08/19         Exercise Goals and Review: Exercise Goals    Row Name 04/26/19 1157             Exercise Goals   Increase Physical Activity  Yes       Intervention  Provide advice, education, support and counseling about physical activity/exercise needs.;Develop an individualized exercise prescription for aerobic and resistive training based on initial evaluation findings, risk stratification, comorbidities and participant's personal goals.       Expected Outcomes  Short Term: Attend rehab on a regular basis to increase amount of physical activity.;Long Term: Add in home exercise to make exercise part of routine and to increase amount of physical activity.;Long Term: Exercising regularly at least 3-5 days a week.       Increase Strength and Stamina  Yes       Intervention  Provide advice, education, support and counseling about physical activity/exercise needs.;Develop an individualized exercise prescription for aerobic and resistive training based on initial evaluation findings, risk stratification, comorbidities and participant's personal goals.       Expected Outcomes  Short Term: Increase workloads from initial exercise prescription for resistance, speed, and METs.;Short Term: Perform resistance training exercises routinely during rehab and add in resistance training at home;Long Term: Improve cardiorespiratory fitness, muscular endurance and strength  as measured by increased METs and functional capacity (6MWT)        Able to understand and use rate of perceived exertion (RPE) scale  Yes       Intervention  Provide education and explanation on how to use RPE scale       Expected Outcomes  Short Term: Able to use RPE daily in rehab to express subjective intensity level;Long Term:  Able to use RPE to guide intensity level when exercising independently       Able to understand and use Dyspnea scale  Yes       Intervention  Provide education and explanation on how to use Dyspnea scale       Expected Outcomes  Short Term: Able to use Dyspnea scale daily in rehab to express subjective sense of shortness of breath during exertion;Long Term: Able to use Dyspnea scale to guide intensity level when exercising independently       Knowledge and understanding of Target Heart Rate Range (THRR)  Yes       Intervention  Provide education and explanation of THRR including how the numbers were predicted and where they are located for reference       Expected Outcomes  Short Term: Able to state/look up THRR;Short Term: Able to use daily as guideline for intensity in rehab;Long Term: Able to use THRR to govern intensity when exercising independently       Able to check pulse independently  Yes       Intervention  Provide education and demonstration on how to check pulse in carotid and radial arteries.;Review the importance of being able to check your own pulse for safety during independent exercise       Expected Outcomes  Short Term: Able to explain why pulse checking is important during independent exercise;Long Term: Able to check pulse independently and accurately       Understanding of Exercise Prescription  Yes       Intervention  Provide education, explanation, and written materials on patient's individual exercise prescription       Expected Outcomes  Short Term: Able to explain program exercise prescription;Long Term: Able to explain home exercise prescription to exercise independently          Exercise Goals  Re-Evaluation : Exercise Goals Re-Evaluation    Row Name 05/12/19 0948 05/25/19 1328 06/06/19 1439 06/08/19 1056       Exercise Goal Re-Evaluation   Exercise Goals Review  Increase Physical Activity;Increase Strength and Stamina  Increase Physical Activity;Increase Strength and Stamina;Able to understand and use rate of perceived exertion (RPE) scale;Knowledge and understanding of Target Heart Rate Range (THRR);Able to check pulse independently;Understanding of Exercise Prescription  Increase Physical Activity;Increase Strength and Stamina;Understanding of Exercise Prescription  Able to understand and use rate of perceived exertion (RPE) scale;Increase Physical Activity;Increase Strength and Stamina;Knowledge and understanding of Target Heart Rate Range (THRR);Able to check pulse independently;Understanding of Exercise Prescription    Comments  He feels like the exercise is difficult to do in the program. 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 Talked to patient about possibly doing Saturday and Sunday when he is off work to exercise. Informed him that he could walk or do whatever exercises he can at home.  Josph Macho has attended consistently and works in [employer] range.  Staff will review home exercise.  Josph Macho has been doing well in rehab.  He had an episode of high blood pressure on the elliptical. We will  continue to monitor his progress.  Reviewed home exercise with pt today.  Pt plans to walk and use staff videos at home for exercise.  Reviewed THR, pulse, RPE, sign and symptoms, NTG use, and when to call 911 or MD.  Also discussed weather considerations and indoor options.  Pt voiced understanding.    Expected Outcomes  Short: continue to exercise in [facility]. Long: go over home exercise information.  Short - continue to attend consistently Long :- improve overall fitness  Short: Continue to increase workloads as pressures allow. Long: Continue to improve stamina.  Short: Start to add in more  walking at home.  Long: Continue to improve stamina and breathing.       Discharge Exercise Prescription (Final Exercise Prescription Changes): Exercise Prescription Changes - 06/08/19 1000      Home Exercise Plan   Plans to continue exercise at  Home (comment)   walking, videos   Frequency  Add 2 additional days to program exercise sessions.    Initial Home Exercises Provided  06/08/19       Nutrition:  Target Goals: Understanding of nutrition guidelines, daily intake of sodium <151m, cholesterol <2067m calories 30% from fat and 7% or less from saturated fats, daily to have 5 or more servings of fruits and vegetables.  Biometrics: Pre Biometrics - 04/26/19 1159      Pre Biometrics   Height  _0  (1.905 m)    Weight  289 lb 4.8 oz (131.2 kg)    BMI (Calculated)  36.16    Single Leg Stand  15.47 seconds        Nutrition Therapy Plan and Nutrition Goals: Nutrition Therapy & Goals - 04/26/19 1139      Nutrition Therapy   Diet  Low Na, HH diet    Protein (specify units)  100g    Fiber  30 grams    Whole Grain Foods  3 servings    Saturated Fats  12 max. grams    Fruits and Vegetables  5 servings/day    Sodium  1.5 grams      Personal Nutrition Goals   Nutrition Goal  ST: decrease Na LT: increase EF and go back to work    Comments  Pt reports B: cheerios with 2% milk. L: (truck) ham and cheese sandwich with mayo and white bread or (home) leftovers from dinner. D: Chicken, beef, fish, and steak on weekends (typically meat is fried). Mix up the grain sometimes WG sometimes not (mac and cheese or brown rice for example). Pt reports not snacking often and that he drinks water during the day, but will have coffee with international delight creamer (too much per pt). Discussed HH and low Na eating.      Intervention Plan   Intervention  Prescribe, educate and counsel regarding individualized specific dietary modifications aiming towards targeted core components such as weight,  hypertension, lipid management, diabetes, heart failure and other comorbidities.;Nutrition handout(s) given to patient.    Expected Outcomes  Short Term Goal: Understand basic principles of dietary content, such as calories, fat, sodium, cholesterol and nutrients.;Short Term Goal: A plan has been developed with personal nutrition goals set during dietitian appointment.;Long Term Goal: Adherence to prescribed nutrition plan.       Nutrition Assessments: Nutrition Assessments - 06/15/19 1015      MEDFICTS Scores   Post Score  34       Nutrition Goals Re-Evaluation: Nutrition Goals Re-Evaluation    RoAlbertaame 05/16/19 1010  Goals   Nutrition Goal  ST: switch to [place] and whole wheat bread for lunch LT: increase EF and go back to work       Comment  Pt reports limiting Na. Pt would like to work on sandwich switching to [place] instead of ham and having whole wheat bread. Pt reports no concerns at this time but breathing and energy levels are the same.       Expected Outcome  ST: switch to [place] and whole wheat bread for lunch LT: increase EF and go back to work          Nutrition Goals Discharge (Final Nutrition Goals Re-Evaluation): Nutrition Goals Re-Evaluation - 05/16/19 1010      Goals   Nutrition Goal  ST: switch to [place] and whole wheat bread for lunch LT: increase EF and go back to work    Comment  Pt reports limiting Na. Pt would like to work on sandwich switching to [place] instead of ham and having whole wheat bread. Pt reports no concerns at this time but breathing and energy levels are the same.    Expected Outcome  ST: switch to [place] and whole wheat bread for lunch LT: increase EF and go back to work       Psychosocial: Target Goals: Acknowledge presence or absence of significant depression and/or stress, maximize coping skills, provide positive support system. Participant is able to verbalize types and ability to use techniques and skills needed for  reducing stress and depression.   Initial Review & Psychosocial Screening: Initial Psych Review & Screening - 04/24/19 1548      Initial Review   Current issues with  Current Stress Concerns    Source of Stress Concerns  Occupation    Comments  Mr. Skibicki can't return to work until his EF is at least higher than 40%. He has MD appointments coming up to reevaluate, he is hoping [facility] helps with that.      Family Dynamics   Good Support System?  Yes      Barriers   Psychosocial barriers to participate in program  There are no identifiable barriers or psychosocial needs.;The patient should benefit from training in stress management and relaxation.      Screening Interventions   Interventions  Encouraged to exercise;To provide support and resources with identified psychosocial needs;Provide feedback about the scores to participant    Expected Outcomes  Short Term goal: Utilizing psychosocial counselor, staff and physician to assist with identification of specific Stressors or current issues interfering with healing process. Setting desired goal for each stressor or current issue identified.;Long Term Goal: Stressors or current issues are controlled or eliminated.;Short Term goal: Identification and review with participant of any Quality of Life or Depression concerns found by scoring the questionnaire.;Long Term goal: The participant improves quality of Life and PHQ9 Scores as seen by post scores and/or verbalization of changes       Quality of Life Scores:  Quality of Life - 04/26/19 1222      Quality of Life   Select  Quality of Life      Quality of Life Scores   Health/Function Pre  19.2 %    Socioeconomic Pre  23.13 %    Psych/Spiritual Pre  22.86 %    Family Pre  22.8 %    GLOBAL Pre  21.34 %      Scores of 19 and below usually indicate a poorer quality of life in these areas.  A difference  of  2-3 points is a clinically meaningful difference.  A difference of 2-3 points in  the total score of the Quality of Life Index has been associated with significant improvement in overall quality of life, self-image, physical symptoms, and general health in studies assessing change in quality of life.  PHQ-9: Recent Review Flowsheet Data    Depression screen [facility] 2/9 06/15/2019 04/26/2019   Decreased Interest 0 1   Down, Depressed, Hopeless 0 0   PHQ - 2 Score 0 1   Altered sleeping 0 0   Tired, decreased energy 1 1   Change in appetite 1 0   Feeling bad or failure about yourself  0 0   Trouble concentrating 0 0   Moving slowly or fidgety/restless 0 0   Suicidal thoughts 0 0   PHQ-9 Score 2 2   Difficult doing work/chores Not difficult at all Not difficult at all     Interpretation of Total Score  Total Score Depression Severity:  1-4 = Minimal depression, 5-9 = Mild depression, 10-14 = Moderate depression, 15-19 = Moderately severe depression, 20-27 = Severe depression   Psychosocial Evaluation and Intervention:   Psychosocial Re-Evaluation: Psychosocial Re-Evaluation    [place] Name 05/12/19 [phone]             Psychosocial Re-Evaluation   Current issues with  Current Stress Concerns       Comments  He likes work and wants to get back to it. His goal is to increase his EF to where is will be cleared to go back to work. When asked about other stress concerns he states he does not have any.       Expected Outcomes  Short: continue to increase EF by exercising. Long: return to work for his mental health.       Interventions  Encouraged to attend Cardiac Rehabilitation for the exercise       Continue Psychosocial Services   Follow up required by staff          Psychosocial Discharge (Final Psychosocial Re-Evaluation): Psychosocial Re-Evaluation - 05/12/19 0955      Psychosocial Re-Evaluation   Current issues with  Current Stress Concerns    Comments  He likes work and wants to get back to it. His goal is to increase his EF to where is will be cleared to go back  to work. When asked about other stress concerns he states he does not have any.    Expected Outcomes  Short: continue to increase EF by exercising. Long: return to work for his mental health.    Interventions  Encouraged to attend Cardiac Rehabilitation for the exercise    Continue Psychosocial Services   Follow up required by staff       Vocational Rehabilitation: Provide vocational rehab assistance to qualifying candidates.   Vocational Rehab Evaluation & Intervention: Vocational Rehab - 04/24/19 1548      Initial Vocational Rehab Evaluation & Intervention   Assessment shows need for Vocational Rehabilitation  No       Education: Education Goals: Education classes will be provided on a variety of topics geared toward better understanding of heart health and risk factor modification. Participant will state understanding/return demonstration of topics presented as noted by education test scores.  Learning Barriers/Preferences: Learning Barriers/Preferences - 04/24/19 1548      Learning Barriers/Preferences   Learning Barriers  None    Learning Preferences  None       Education Topics:  AED/CPR: - Group  verbal and written instruction with the use of models to demonstrate the basic use of the AED with the basic ABC's of resuscitation.   General Nutrition Guidelines/Fats and Fiber: -Group instruction provided by verbal, written material, models and posters to present the general guidelines for heart healthy nutrition. Gives an explanation and review of dietary fats and fiber.   Controlling Sodium/Reading Food Labels: -Group verbal and written material supporting the discussion of sodium use in heart healthy nutrition. Review and explanation with models, verbal and written materials for utilization of the food label.   Exercise Physiology & General Exercise Guidelines: - Group verbal and written instruction with models to review the exercise physiology of the cardiovascular  system and associated critical values. Provides general exercise guidelines with specific guidelines to those with heart or lung disease.    Aerobic Exercise & Resistance Training: - Gives group verbal and written instruction on the various components of exercise. Focuses on aerobic and resistive training programs and the benefits of this training and how to safely progress through these programs..   Flexibility, Balance, Mind/Body Relaxation: Provides group verbal/written instruction on the benefits of flexibility and balance training, including mind/body exercise modes such as yoga, pilates and tai chi.  Demonstration and skill practice provided.   Stress and Anxiety: - Provides group verbal and written instruction about the health risks of elevated stress and causes of high stress.  Discuss the correlation between heart/lung disease and anxiety and treatment options. Review healthy ways to manage with stress and anxiety.   Depression: - Provides group verbal and written instruction on the correlation between heart/lung disease and depressed mood, treatment options, and the stigmas associated with seeking treatment.   Anatomy & Physiology of the Heart: - Group verbal and written instruction and models provide basic cardiac anatomy and physiology, with the coronary electrical and arterial systems. Review of Valvular disease and Heart Failure   Cardiac Procedures: - Group verbal and written instruction to review commonly prescribed medications for heart disease. Reviews the medication, class of the drug, and side effects. Includes the steps to properly store meds and maintain the prescription regimen. (beta blockers and nitrates)   Cardiac Medications I: - Group verbal and written instruction to review commonly prescribed medications for heart disease. Reviews the medication, class of the drug, and side effects. Includes the steps to properly store meds and maintain the prescription  regimen.   Cardiac Medications II: -Group verbal and written instruction to review commonly prescribed medications for heart disease. Reviews the medication, class of the drug, and side effects. (all other drug classes)    Go Sex-Intimacy & Heart Disease, Get SMART - Goal Setting: - Group verbal and written instruction through game format to discuss heart disease and the return to sexual intimacy. Provides group verbal and written material to discuss and apply goal setting through the application of the S.M.A.R.T. Method.   Other Matters of the Heart: - Provides group verbal, written materials and models to describe Stable Angina and Peripheral Artery. Includes description of the disease process and treatment options available to the cardiac patient.   Exercise & Equipment Safety: - Individual verbal instruction and demonstration of equipment use and safety with use of the equipment.   Cardiac Rehab from 04/26/2019 in [facility] Cardiac and Pulmonary Rehab  Date  04/26/19  Educator  AS  Instruction Review Code  1- Verbalizes Understanding      Infection Prevention: - Provides verbal and written material to individual with discussion of infection control  including proper hand washing and proper equipment cleaning during exercise session.   Cardiac Rehab from 04/26/2019 in [facility] Cardiac and Pulmonary Rehab  Date  04/26/19  Educator  AS  Instruction Review Code  1- Verbalizes Understanding      Falls Prevention: - Provides verbal and written material to individual with discussion of falls prevention and safety.   Cardiac Rehab from 04/26/2019 in [facility] Cardiac and Pulmonary Rehab  Date  04/26/19  Educator  AS  Instruction Review Code  1- Verbalizes Understanding      Diabetes: - Individual verbal and written instruction to review signs/symptoms of diabetes, desired ranges of glucose level fasting, after meals and with exercise. Acknowledge that pre and post exercise glucose checks will be  done for 3 sessions at entry of program.   Know Your Numbers and Risk Factors: -Group verbal and written instruction about important numbers in your health.  Discussion of what are risk factors and how they play a role in the disease process.  Review of Cholesterol, Blood Pressure, Diabetes, and BMI and the role they play in your overall health.   Sleep Hygiene: -Provides group verbal and written instruction about how sleep can affect your health.  Define sleep hygiene, discuss sleep cycles and impact of sleep habits. Review good sleep hygiene tips.    Other: -Provides group and verbal instruction on various topics (see comments)   Knowledge Questionnaire Score: Knowledge Questionnaire Score - 06/15/19 1015      Knowledge Questionnaire Score   Post Score  24/26       Core Components/Risk Factors/Patient Goals at Admission: Personal Goals and Risk Factors at Admission - 04/26/19 1206      Core Components/Risk Factors/Patient Goals on Admission    Weight Management  Yes    Intervention  Weight Management/Obesity: Establish reasonable short term and long term weight goals.    Admit Weight  289 lb 4.8 oz (131.2 kg)    Goal Weight: Short Term  279 lb (126.6 kg)    Goal Weight: Long Term  269 lb (122 kg)    Expected Outcomes  Short Term: Continue to assess and modify interventions until short term weight is achieved;Long Term: Adherence to nutrition and physical activity/exercise program aimed toward attainment of established weight goal;Weight Loss: Understanding of general recommendations for a balanced deficit meal plan, which promotes 1-2 lb weight loss per week and includes a negative energy balance of [phone] kcal/d;Understanding recommendations for meals to include 15-35% energy as protein, 25-35% energy from fat, 35-60% energy from carbohydrates, less than 276m of dietary cholesterol, 20-35 gm of total fiber daily;Weight Gain: Understanding of general recommendations for a high  calorie, high protein meal plan that promotes weight gain by distributing calorie intake throughout the day with the consumption for 4-5 meals, snacks, and/or supplements;Understanding of distribution of calorie intake throughout the day with the consumption of 4-5 meals/snacks    Intervention  Provide a combined exercise and nutrition program that is supplemented with education, support and counseling about heart failure. Directed toward relieving symptoms such as shortness of breath, decreased exercise tolerance, and extremity edema.    Expected Outcomes  Improve functional capacity of life;Short term: Attendance in program 2-3 days a week with increased exercise capacity. Reported lower sodium intake. Reported increased fruit and vegetable intake. Reports medication compliance.;Short term: Daily weights obtained and reported for increase. Utilizing diuretic protocols set by physician.;Long term: Adoption of self-care skills and reduction of barriers for early signs and symptoms recognition and intervention  leading to self-care maintenance.    Intervention  Provide education on lifestyle modifcations including regular physical activity/exercise, weight management, moderate sodium restriction and increased consumption of fresh fruit, vegetables, and low fat dairy, alcohol moderation, and smoking cessation.;Monitor prescription use compliance.    Expected Outcomes  Short Term: Continued assessment and intervention until BP is < 140/84m HG in hypertensive participants. < 130/874mHG in hypertensive participants with diabetes, heart failure or chronic kidney disease.;Long Term: Maintenance of blood pressure at goal levels.    Intervention  Provide education and support for participant on nutrition & aerobic/resistive exercise along with prescribed medications to achieve LDL <7034mHDL >71m79m  Expected Outcomes  Short Term: Participant states understanding of desired cholesterol values and is compliant with  medications prescribed. Participant is following exercise prescription and nutrition guidelines.;Long Term: Cholesterol controlled with medications as prescribed, with individualized exercise RX and with personalized nutrition plan. Value goals: LDL < 70mg58mL > 40 mg.       Core Components/Risk Factors/Patient Goals Review:  Goals and Risk Factor Review    Row Name 05/12/19 0958             Core Components/Risk Factors/Patient Goals Review   Personal Goals Review  Weight Management/Obesity;Lipids;Hypertension;Heart Failure       Review  Patient wants to lose weight but has gained some since the start of the program. His blood pressure is a little high but has not taken his medication this morning. He is going to start taking his blood pressure medications [facility] to keep a three day diary of his foods to give to the Dietician.       Expected Outcomes  Short: lose 5 pounds and keep a three day food diary. Long: lose 15 pounds with exercise and diet post [facility].          Core Components/Risk Factors/Patient Goals at Discharge (Final Review):  Goals and Risk Factor Review - 05/12/19 0958      Core Components/Risk Factors/Patient Goals Review   Personal Goals Review  Weight Management/Obesity;Lipids;Hypertension;Heart Failure    Review  Patient wants to lose weight but has gained some since the start of the program. His blood pressure is a little high but has not taken his medication this morning. He is going to start taking his blood pressure medications [facility] to keep a three day diary of his foods to give to the Dietician.    Expected Outcomes  Short: lose 5 pounds and keep a three day food diary. Long: lose 15 pounds with exercise and diet post [facility].       ITP Comments: ITP Comments    Row Name 04/24/19 1544 04/26/19 1144 05/03/19 0718 05/31/19 1329 06/15/19 1055   ITP Comments  Virtual  Initial Orientation compelted. [facility]. EP/RD Orientation scheduled for 9/2 at 10am  Initial 6MWT and nutrition eval completed.  ITP created and sent to Dr MilleSabra Heckcal Director  30 Day Review Completed today. Continue with ITP unless changed by Medical Director review.  New to program  30 day review completed. ITP sent to Dr. Mark Emily Filbertical Director of Cardiac and Pulmonary Rehab. Continue with ITP unless changes are made by physician.  Department closed starting 10/2 until further notice by infection prevention and Health at Work teams for COVIDDefiancemesDonaldouated today from  rehab with 15/36 sessions completed.  Details of the patient's exercise  prescription and what He needs to do in order to continue the prescription and progress were discussed with patient.  Patient was given a copy of prescription and goals.  Patient verbalized understanding.  Gabriela plans to continue to exercise bywalking.      Comments: discharged today  BACK TO WORK

## 2020-06-26 ENCOUNTER — Encounter: Payer: Self-pay | Admitting: Dermatology

## 2020-06-26 ENCOUNTER — Ambulatory Visit (INDEPENDENT_AMBULATORY_CARE_PROVIDER_SITE_OTHER): Payer: BC Managed Care – PPO | Admitting: Dermatology

## 2020-06-26 ENCOUNTER — Other Ambulatory Visit: Payer: Self-pay

## 2020-06-26 DIAGNOSIS — L700 Acne vulgaris: Secondary | ICD-10-CM

## 2020-06-26 DIAGNOSIS — L820 Inflamed seborrheic keratosis: Secondary | ICD-10-CM | POA: Diagnosis not present

## 2020-06-26 DIAGNOSIS — Z1283 Encounter for screening for malignant neoplasm of skin: Secondary | ICD-10-CM | POA: Diagnosis not present

## 2020-06-26 DIAGNOSIS — D1800 Hemangioma unspecified site: Secondary | ICD-10-CM

## 2020-06-26 DIAGNOSIS — L578 Other skin changes due to chronic exposure to nonionizing radiation: Secondary | ICD-10-CM

## 2020-06-26 DIAGNOSIS — L719 Rosacea, unspecified: Secondary | ICD-10-CM | POA: Diagnosis not present

## 2020-06-26 DIAGNOSIS — D229 Melanocytic nevi, unspecified: Secondary | ICD-10-CM

## 2020-06-26 DIAGNOSIS — L732 Hidradenitis suppurativa: Secondary | ICD-10-CM | POA: Diagnosis not present

## 2020-06-26 DIAGNOSIS — D692 Other nonthrombocytopenic purpura: Secondary | ICD-10-CM

## 2020-06-26 DIAGNOSIS — L821 Other seborrheic keratosis: Secondary | ICD-10-CM

## 2020-06-26 DIAGNOSIS — Z85828 Personal history of other malignant neoplasm of skin: Secondary | ICD-10-CM

## 2020-06-26 DIAGNOSIS — L814 Other melanin hyperpigmentation: Secondary | ICD-10-CM

## 2020-06-26 MED ORDER — TRETINOIN 0.025 % EX CREA: TOPICAL_CREAM | Freq: Every evening | CUTANEOUS | 11 refills | Status: AC

## 2020-06-26 NOTE — Progress Notes (Signed)
New Patient Visit  Subjective  Mason Frank is a 59 y.o. male who presents for the following: mole check (Total body skin exam, hx of skin ca R cheek). The patient presents for Total-Body Skin Exam (TBSE) for skin cancer screening and mole check.  The following portions of the chart were reviewed this encounter and updated as appropriate:  Tobacco  Allergies  Meds  Problems  Med Hx  Surg Hx  Fam Hx     Review of Systems:  No other skin or systemic complaints except as noted in HPI or Assessment and Plan.  Objective  Well appearing patient in no apparent distress; mood and affect are within normal limits.  A full examination was performed including scalp, head, eyes, ears, nose, lips, neck, chest, axillae, abdomen, back, buttocks, bilateral upper extremities, bilateral lower extremities, hands, feet, fingers, toes, fingernails, and toenails. All findings within normal limits unless otherwise noted below.  Objective  forehead: Comedones forehead  Objective  buttocks, groin: Scarring and open fistulas   Objective  Right Axilla x 1, R post thigh x 3 (4): Erythematous keratotic or waxy stuck-on papule or plaque.    Assessment & Plan    Purpura -Chronic and persistent - Violaceous macules and patches - Benign - Related to age, sun damage and/or use of blood thinners - Observe - Can use OTC arnica containing moisturizer such as Dermend Bruise Formula if desired - Call for worsening or other concerns - Discussed Dermend with arnica  Lentigines - Scattered tan macules - Discussed due to sun exposure - Benign, observe - Call for any changes  Seborrheic Keratoses - Stuck-on, waxy, tan-brown papules and plaques  - Discussed benign etiology and prognosis. - Observe - Call for any changes  Melanocytic Nevi - Tan-brown and/or pink-flesh-colored symmetric macules and papules - Benign appearing on exam today - Observation - Call clinic for new or changing moles -  Recommend daily use of broad spectrum spf 30+ sunscreen to sun-exposed areas.   Hemangiomas - Red papules - Discussed benign nature - Observe - Call for any changes  Actinic Damage - chronic, secondary to cumulative UV exposure - diffuse scaly erythematous macules with underlying dyspigmentation - Recommend daily broad spectrum sunscreen SPF 30+ to sun-exposed areas, reapply every 2 hours as needed.  - Call for new or changing lesions.  Skin cancer screening performed today.  History of Skin Cancer  R cheek Clear. Observe for recurrence.  Call clinic for new or changing lesions.   Recommend regular skin exams, daily broad-spectrum spf 30+ sunscreen use, and photoprotection.     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 It is treatable but not curable. It sometimes affects the eyes (ocular rosacea) as well. It may respond to topical and/or systemic medication and can flare with stress, sun exposure, alcohol, exercise and some foods.  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 Goal is control and prevention of flares, not cure. Scars are permanent. Treatment may include daily use of topical and oral antibiotics to decrease inflammation.  Oral isotretinoin may also be helpful, but this  generally is not given long term.  For more severe cases, Humira (a biologic injection) may be prescribed to decrease the inflammatory process and prevent flares.  When indicated, inflamed cysts may also be  treated surgically with incision and drainage, and/or surgical removal.   No treatment at this time -patient declines treatment Patient will let us know if he has a flare.  Inflamed seborrheic keratosis (4) Right Axilla x 1, R post thigh x 3  Destruction of lesion - Right Axilla x 1, R post thigh x 3 Complexity: simple   Destruction method: cryotherapy   Informed consent: discussed and consent obtained   Timeout:  patient name, date of birth, surgical site, and procedure verified Lesion destroyed using liquid nitrogen: Yes   Region frozen until ice ball extended beyond lesion: Yes   Outcome: patient tolerated procedure well with no complications   Post-procedure details: wound care instructions given    Return in about 1 year (around 06/26/2021) for TBSE, hx of skin ca, .   I, Sonya Hupman, RMA, am acting as scribe for Sarina Ser, MD .  Documentation: I have reviewed the above documentation for accuracy and completeness, and I agree with the above.  Sarina Ser, MD

## 2021-06-04 ENCOUNTER — Other Ambulatory Visit: Payer: Self-pay | Admitting: Student

## 2021-06-04 DIAGNOSIS — I2510 Atherosclerotic heart disease of native coronary artery without angina pectoris: Secondary | ICD-10-CM

## 2021-07-02 ENCOUNTER — Ambulatory Visit (INDEPENDENT_AMBULATORY_CARE_PROVIDER_SITE_OTHER): Payer: BC Managed Care – PPO | Admitting: Dermatology

## 2021-07-02 ENCOUNTER — Encounter: Payer: Self-pay | Admitting: Dermatology

## 2021-07-02 ENCOUNTER — Other Ambulatory Visit: Payer: Self-pay

## 2021-07-02 DIAGNOSIS — D1801 Hemangioma of skin and subcutaneous tissue: Secondary | ICD-10-CM

## 2021-07-02 DIAGNOSIS — D229 Melanocytic nevi, unspecified: Secondary | ICD-10-CM

## 2021-07-02 DIAGNOSIS — Z1283 Encounter for screening for malignant neoplasm of skin: Secondary | ICD-10-CM | POA: Diagnosis not present

## 2021-07-02 DIAGNOSIS — L700 Acne vulgaris: Secondary | ICD-10-CM

## 2021-07-02 DIAGNOSIS — L988 Other specified disorders of the skin and subcutaneous tissue: Secondary | ICD-10-CM

## 2021-07-02 DIAGNOSIS — L719 Rosacea, unspecified: Secondary | ICD-10-CM | POA: Diagnosis not present

## 2021-07-02 DIAGNOSIS — Z85828 Personal history of other malignant neoplasm of skin: Secondary | ICD-10-CM

## 2021-07-02 DIAGNOSIS — L820 Inflamed seborrheic keratosis: Secondary | ICD-10-CM

## 2021-07-02 DIAGNOSIS — L578 Other skin changes due to chronic exposure to nonionizing radiation: Secondary | ICD-10-CM | POA: Diagnosis not present

## 2021-07-02 DIAGNOSIS — B353 Tinea pedis: Secondary | ICD-10-CM

## 2021-07-02 DIAGNOSIS — L821 Other seborrheic keratosis: Secondary | ICD-10-CM | POA: Diagnosis not present

## 2021-07-02 DIAGNOSIS — L570 Actinic keratosis: Secondary | ICD-10-CM | POA: Diagnosis not present

## 2021-07-02 DIAGNOSIS — L814 Other melanin hyperpigmentation: Secondary | ICD-10-CM

## 2021-07-02 DIAGNOSIS — L732 Hidradenitis suppurativa: Secondary | ICD-10-CM

## 2021-07-02 MED ORDER — KETOCONAZOLE 2 % EX CREA: TOPICAL_CREAM | CUTANEOUS | 6 refills | Status: DC

## 2021-07-02 NOTE — Progress Notes (Signed)
Follow-Up Visit   Subjective  Mason Frank is a 60 y.o. male who presents for the following: Annual Exam (Hx skin Ca on the L cheek - pt unsure which type but it was treated 25+ years ago. He has noticed a lesion on his L scalp that he would like checked today as it is irritated.). The patient presents for Total-Body Skin Exam (TBSE) for skin cancer screening and mole check.  The following portions of the chart were reviewed this encounter and updated as appropriate:   Tobacco  Allergies  Meds  Problems  Med Hx  Surg Hx  Fam Hx     Review of Systems:  No other skin or systemic complaints except as noted in HPI or Assessment and Plan.  Objective  Well appearing patient in no apparent distress; mood and affect are within normal limits.  A full examination was performed including scalp, head, eyes, ears, nose, lips, neck, chest, axillae, abdomen, back, buttocks, bilateral upper extremities, bilateral lower extremities, hands, feet, fingers, toes, fingernails, and toenails. All findings within normal limits unless otherwise noted below.  Scalp x 3 (3) Erythematous thin papules/macules with gritty scale.   Face With comedones, cysts, and actinic damage.  Face Thickening of the skin on the nose.  B/L foot Scale of the feet.   Back x 15 (15) Erythematous keratotic or waxy stuck-on papule or plaque.    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 Only apply a pea-sized amount to the entire affected area. Avoid applying it around the eyes, edges of mouth and creases at the nose. If you experience irritation, use a good moisturizer first and/or apply the medicine less often. If you are doing well with the medicine, you can increase how often you use it until you are applying every night. Be careful with sun protection while using this medication as it can make you sensitive to the sun. This medicine should not be used by pregnant women.   Rosacea Face With rhinophyma -  Rosacea is a chronic progressive skin condition usually affecting the face of adults, causing redness and/or acne bumps. It is treatable but not curable. It sometimes affects the eyes (ocular rosacea) as well. It may respond to topical and/or systemic medication and can flare with stress, sun exposure, alcohol, exercise and some foods.  Daily application of broad spectrum spf 30+ sunscreen to face is recommended to reduce flares.  Start [facility] Tretinoin 0.05% cream mix QHS. Topical retinoid medications like tretinoin/Retin-A, adapalene/Differin, tazarotene/Fabior, and Epiduo/Epiduo Forte can cause dryness and irritation when first started. Only apply a pea-sized amount to the entire affected area. Avoid applying it around the eyes, edges of mouth and creases at the nose. If you experience irritation, use a good moisturizer first and/or apply the medicine less often. If you are  doing well with the medicine, you can increase how often you use it until you are applying every night. Be careful with sun protection while using this medication as it can make you sensitive to the sun. This medicine should not be used by pregnant women.   Start [facility]  triple rosacea cream QAM.   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  ketoconazole (NIZORAL) 2 % cream - B/L foot Apply to the feet QHS.  Inflamed seborrheic keratosis Back x 15 Destruction of lesion - Back x 15 Complexity: simple   Destruction method: cryotherapy   Informed consent: discussed and consent obtained   Timeout:  patient name, date of birth, surgical site, and procedure verified Lesion destroyed using liquid nitrogen: Yes   Region frozen until ice ball extended beyond lesion: Yes   Outcome: patient tolerated procedure well with no complications   Post-procedure details: wound care instructions given    Hidradenitis suppurativa Right Suprapubic Area  Skin cancer screening  Lentigines - Scattered tan macules - Due to sun exposure - Benign-appearing, observe - Recommend daily broad spectrum sunscreen SPF 30+ to sun-exposed areas, reapply every 2 hours as needed. - Call for any changes  Seborrheic Keratoses - Stuck-on, waxy, tan-brown papules and/or plaques  - Benign-appearing - Discussed benign etiology and prognosis. - Observe - Call for any changes  Melanocytic Nevi - Tan-brown and/or pink-flesh-colored symmetric macules and papules - Benign appearing on exam today - Observation - Call clinic for new or changing moles - Recommend daily use of broad spectrum spf 30+ sunscreen to sun-exposed areas.   Hemangiomas - Red papules - Discussed benign nature - Observe - Call for any changes  Actinic Damage - Chronic condition, secondary to cumulative UV/sun exposure - diffuse scaly erythematous macules with underlying dyspigmentation - Recommend daily broad spectrum sunscreen SPF 30+ to sun-exposed areas, reapply every 2 hours as needed.  - Staying in the shade or wearing long sleeves, sun glasses (UVA+UVB protection) and wide brim hats (4-inch brim around the entire circumference of the hat) are also  recommended for sun protection.  - Call for new or changing lesions.  History of Skin Cancer - unsure which type, treated 25+ years ago - No evidence of recurrence today - Recommend regular full body skin exams - Recommend daily broad spectrum sunscreen SPF 30+ to sun-exposed areas, reapply every 2 hours as needed.  - Call if any new or changing lesions are noted between office visits  Skin cancer screening performed today.  Return in about 3 months (around 10/02/2021) for rosacea, tinea, AK's, ISK's and HS f/u .  I, Rudell Cobb, CMA, am acting as scribe for Sarina Ser, MD . Documentation: I have reviewed the above documentation for accuracy and completeness, and I agree with the above.  Sarina Ser, MD

## 2021-07-02 NOTE — Patient Instructions (Addendum)
Instructions for [facility] Medications  One or more of your medications was sent to the [facility] mail order compounding pharmacy. You will receive an email from them and can purchase the medicine through that link. It will then be mailed to your home at the address you confirmed. If for any reason you do not receive an email from them, please check your spam folder. If you still do not find the email, please let us know. [facility] phone number is [phone].   If you have any questions or concerns for your doctor, please call our main line at [phone] and press option 4 to reach your doctor's medical assistant. If no one answers, please leave a voicemail as directed and we will return your call as soon as possible. Messages left after 4 pm will be answered the following business day.   You may also send us a message via MyChart. We typically respond to MyChart messages within 1-2 business days.  For prescription refills, please ask your pharmacy to contact our office. Our fax number is [phone].  If you have an urgent issue when the clinic is closed that cannot wait until the next business day, you can page your doctor at the number below.    Please note that while we do our best to be available for urgent issues outside of office hours, we are not available 24/7.   If you have an urgent issue and are unable to reach us, you may choose to seek medical care at your doctor's office, retail clinic, urgent care center, or emergency room.  If you have a medical emergency, please immediately call 911 or go to the emergency department.  Pager Numbers  - Dr. Kowalski: [phone]  - Dr. Moye: [phone]  - Dr. Stewart: [phone]  In the event of inclement weather, please call our main line at [phone] for an update on the status of any delays or closures.  Dermatology Medication Tips: Please keep the boxes that topical medications come in in order to help  keep track of the instructions about where and how to use these. Pharmacies typically print the medication instructions only on the boxes and not directly on the medication tubes.   If your medication is too expensive, please contact our office at [phone] option 4 or send us a message through MyChart.   We are unable to tell what your co-pay for medications will be in advance as this is different depending on your insurance coverage. However, we may be able to find a substitute medication at lower cost or fill out paperwork to get insurance to cover a needed medication.   If a prior authorization is required to get your medication covered by your insurance company, please allow us 1-2 business days to complete this process.  Drug prices often vary depending on where the prescription is filled and some pharmacies may offer cheaper prices.  The website www.goodrx.com contains coupons for medications through different pharmacies. The prices here do not account for what the cost may be with help from insurance (it may be cheaper with your insurance), but the website can give you the price if you did not use any insurance.  - You can print the associated coupon and take it with your prescription to the pharmacy.  - You may also stop by our office during regular business hours and pick up a GoodRx coupon card.  - If you need your prescription sent electronically to a different pharmacy, notify our office   through [facility] MyChart or by phone at [phone] option 4.  

## 2021-07-03 ENCOUNTER — Encounter: Payer: Self-pay | Admitting: Dermatology

## 2021-10-15 ENCOUNTER — Ambulatory Visit: Payer: BC Managed Care – PPO | Admitting: Dermatology

## 2021-10-27 ENCOUNTER — Ambulatory Visit: Payer: BC Managed Care – PPO | Admitting: Dermatology

## 2021-11-19 ENCOUNTER — Other Ambulatory Visit: Payer: Self-pay

## 2021-11-19 ENCOUNTER — Ambulatory Visit (INDEPENDENT_AMBULATORY_CARE_PROVIDER_SITE_OTHER): Payer: BC Managed Care – PPO | Admitting: Dermatology

## 2021-11-19 DIAGNOSIS — L719 Rosacea, unspecified: Secondary | ICD-10-CM

## 2021-11-19 DIAGNOSIS — L700 Acne vulgaris: Secondary | ICD-10-CM

## 2021-11-19 DIAGNOSIS — L578 Other skin changes due to chronic exposure to nonionizing radiation: Secondary | ICD-10-CM | POA: Diagnosis not present

## 2021-11-19 DIAGNOSIS — L732 Hidradenitis suppurativa: Secondary | ICD-10-CM

## 2021-11-19 DIAGNOSIS — L711 Rhinophyma: Secondary | ICD-10-CM

## 2021-11-19 DIAGNOSIS — B353 Tinea pedis: Secondary | ICD-10-CM

## 2021-11-19 MED ORDER — DOXYCYCLINE HYCLATE 50 MG PO CAPS
50.0000 mg | ORAL_CAPSULE | Freq: Every day | ORAL | 1 refills | Status: DC
Start: 2021-11-19 — End: 2022-08-31

## 2021-11-19 MED ORDER — KETOCONAZOLE 2 % EX CREA: TOPICAL_CREAM | CUTANEOUS | 6 refills | Status: AC

## 2021-11-19 NOTE — Progress Notes (Signed)
? ?Follow-Up Visit ?  ?Subjective  ?Mason Frank is a 61 y.o. male who presents for the following: Rosacea (With rhinophyma and favre Racouchot - patient currently using [facility] mix although he is unsure which one only on the nose and not the entire face. He lost a topical cream that was prescribed and he isn't sure what medication goes where anymore. ), tinea pedis (B/L foot - patient using Ketoconazole 2% cream QHS to the feet. ), and HS (Patient continues to have outbreaks in the groin but he lost the topical prescription he was using and is unsure of the name of it. ). ? ?The following portions of the chart were reviewed this encounter and updated as appropriate:  ? Tobacco  Allergies  Meds  Problems  Med Hx  Surg Hx  Fam Hx   ?  ?Review of Systems:  No other skin or systemic complaints except as noted in HPI or Assessment and Plan. ? ?Objective  ?Well appearing patient in no apparent distress; mood and affect are within normal limits. ? ?A focused examination was performed including the face. Relevant physical exam findings are noted in the Assessment and Plan. ? ?Supra pubic ?Active papules of the supra pubic area. ? ?B/L foot ?Scaling and maceration web spaces and over distal and lateral soles.  ? ? ?Assessment & Plan  ?Rosacea ?Face ?With Rhinophyma -   ?Rosacea is a chronic progressive skin condition usually affecting the face of adults, causing redness and/or acne bumps. It is treatable but not curable. It sometimes affects the eyes (ocular rosacea) as well. It may respond to topical and/or systemic medication and can flare with stress, sun exposure, alcohol, exercise and some foods.  Daily application of broad spectrum spf 30+ sunscreen to face is recommended to reduce flares. ? ?Continue [facility] Triple Rosacea cream QD to the entire face and not just the nose.  ?Start Doxycycline '50mg'$  po QD #90 1RF.  ? ?Doxycycline should be taken with food to prevent nausea. Do not lay down for  30 minutes after taking. Be cautious with sun exposure and use good sun protection while on this medication. Pregnant women should not take this medication.  ? ?Mason Frank and Racouchot syndrome ?Face ?Apply the [facility] Triple Rosacea cream mix to the entire face not just the nose.  ?We will hold off on the topical retinoid at this time in order to simplify the patient's regimen. ? ?Hidradenitis suppurativa ?With flare of a nodule of the pubic area. ?Supra pubic ?Hidradenitis Suppurativa is a chronic; persistent; non-curable, but treatable condition due to abnormal inflamed sweat glands in the body folds (axilla, inframammary, groin, medial thighs), causing recurrent painful draining cysts and scarring. It can be associated with severe scarring acne and cysts; also abscesses and scarring of scalp. The goal is control and prevention of flares, as it is not curable. Scars are permanent and can be thickened. Treatment may include daily use of topical medication and oral antibiotics.  Oral isotretinoin may also be helpful.  For more severe cases, Humira (a biologic injection) may be prescribed to decrease the inflammatory process and prevent flares.  When indicated, inflamed cysts may also be treated surgically. ? ?Start Doxycycline '50mg'$  po QD with food #90 1RF.  ? ?Doxycycline should be taken with food to prevent nausea. Do not lay down for 30 minutes after taking. Be cautious with sun exposure and use good sun protection while on this medication. Pregnant women should not take this medication.  ? ?  doxycycline (VIBRAMYCIN) 50 MG capsule - Supra pubic ?Take 1 capsule (50 mg total) by mouth daily. Take with food. ? ?Tinea pedis of both feet ?B/L foot ?Chronic and persistent condition with duration or expected duration over one year. Condition is symptomatic / bothersome to patient. Not to goal. ? ?Continue Ketoconazole 2% cream to feet QHS.  ? ?Related Medications ?ketoconazole (NIZORAL) 2 % cream ?For fungal  infection apply to the feet QHS. ? ?Return in about 3 months (around 02/19/2022) for HS f/u . ? ?IRudell Cobb, CMA, am acting as scribe for Sarina Ser, MD . ?Documentation: I have reviewed the above documentation for accuracy and completeness, and I agree with the above. ? ?Sarina Ser, MD ? ? ?

## 2021-11-19 NOTE — Patient Instructions (Addendum)
Instructions for [facility] Medications ? ?One or more of your medications was sent to the [facility] mail order compounding pharmacy. You will receive an email from them and can purchase the medicine through that link. It will then be mailed to your home at the address you confirmed. If for any reason you do not receive an email from them, please check your spam folder. If you still do not find the email, please let us know. [facility] phone number is [phone]. ? ?Doxycycline should be taken with food to prevent nausea. Do not lay down for 30 minutes after taking. Be cautious with sun exposure and use good sun protection while on this medication. Pregnant women should not take this medication.  ? ?If You Need Anything After Your Visit ? ?If you have any questions or concerns for your doctor, please call our main line at [phone] and press option 4 to reach your doctor's medical assistant. If no one answers, please leave a voicemail as directed and we will return your call as soon as possible. Messages left after 4 pm will be answered the following business day.  ? ?You may also send [place] a message via MyChart. We typically respond to MyChart messages within 1-2 business days. ? ?For prescription refills, please ask your pharmacy to contact our office. Our fax number is [phone]. ? ?If you have an urgent issue when the clinic is closed that cannot wait until the next business day, you can page your doctor at the number below.   ? ?Please note that while we do our best to be available for urgent issues outside of office hours, we are not available 24/7.  ? ?If you have an urgent issue and are unable to reach [place], you may choose to seek medical care at your doctor's office, retail clinic, urgent care center, or emergency room. ? ?If you have a medical emergency, please immediately call 911 or go to the emergency department. ? ?Pager Numbers ? ?- Dr. Nehemiah Massed: [phone] ? ?- Dr. Laurence Ferrari:  [phone] ? ?- Dr. Nicole Kindred: [phone] ? ?In the event of inclement weather, please call our main line at [phone] for an update on the status of any delays or closures. ? ?Dermatology Medication Tips: ?Please keep the boxes that topical medications come in in order to help keep track of the instructions about where and how to use these. Pharmacies typically print the medication instructions only on the boxes and not directly on the medication tubes.  ? ?If your medication is too expensive, please contact our office at [phone] option 4 or send [place] a message through [place].  ? ?We are unable to tell what your co-pay for medications will be in advance as this is different depending on your insurance coverage. However, we may be able to find a substitute medication at lower cost or fill out paperwork to get insurance to cover a needed medication.  ? ?If a prior authorization is required to get your medication covered by your insurance company, please allow [place] 1-2 business days to complete this process. ? ?Drug prices often vary depending on where the prescription is filled and some pharmacies may offer cheaper prices. ? ?The website www.goodrx.com contains coupons for medications through different pharmacies. The prices here do not account for what the cost may be with help from insurance (it may be cheaper with your insurance), but the website can give you the price if you did not use any insurance.  ?- You can print  the associated coupon and take it with your prescription to the pharmacy.  ?- You may also stop by our office during regular business hours and pick up a GoodRx coupon card.  ?- If you need your prescription sent electronically to a different pharmacy, notify our office through [facility] or by phone at [phone] option 4. ? ? ? ? ?Si Usted Necesita Algo Despu?s de Su Visita ? ?Tambi?n puede enviarnos un mensaje a trav?s de MyChart. Por lo general respondemos a los mensajes de  MyChart en el transcurso de 1 a 2 d?as h?biles. ? ?Para renovar recetas, por favor pida a su farmacia que se ponga en contacto con nuestra oficina. Nuestro n?mero de fax es el [phone]. ? ?Si tiene un asunto urgente cuando la cl?nica est? cerrada y que no puede esperar hasta el siguiente d?a h?bil, puede llamar/localizar a su doctor(a) al n?mero que aparece a continuaci?n.  ? ?Por favor, tenga en cuenta que aunque hacemos todo lo posible para estar disponibles para asuntos urgentes fuera del horario de oficina, no estamos disponibles las 24 horas del d?a, los 7 d?as de la semana.  ? ?Si tiene un problema urgente y no puede comunicarse con nosotros, puede optar por buscar atenci?n m?dica  en el consultorio de su doctor(a), en una cl?nica privada, en un centro de atenci?n urgente o en una sala de emergencias. ? ?Si tiene Engineer, maintenance (IT) m?dica, por favor llame inmediatamente al 911 o vaya a la sala de emergencias. ? ?N?meros de b?per ? ?- Dr. Nehemiah Massed: [phone] ? ?- Dra. Moye: [phone] ? ?- Dra. Nicole Kindred: [phone] ? ?En caso de inclemencias del tiempo, por favor llame a nuestra l?nea principal al [phone] para una actualizaci?n sobre el estado de cualquier retraso o cierre. ? ?Consejos para la medicaci?n en dermatolog?a: ?Por favor, guarde las cajas en las que vienen los medicamentos de uso t?pico para ayudarle a seguir las instrucciones sobre d?nde y c?mo usarlos. Las farmacias generalmente imprimen las instrucciones del medicamento s?lo en las cajas y no directamente en los tubos del Golf.  ? ?Si su medicamento es muy caro, por favor, p?ngase en contacto con Zigmund Daniel llamando al [phone] y presione la opci?n 4 o env?enos un mensaje a trav?s de MyChart.  ? ?No podemos decirle cu?l ser? su copago por los medicamentos por adelantado ya que esto es diferente dependiendo de la cobertura de su seguro. Sin embargo, es posible que podamos encontrar un medicamento sustituto a Contractor un formulario para que el seguro cubra el medicamento que se considera necesario.  ? ?Si se requiere Ardelia Mems autorizaci?n previa para que su compa??a de seguros [place] su medicamento, por favor perm?tanos de 1 a 2 d?as h?biles para completar este proceso. ? ?Los precios de los medicamentos var?an con frecuencia dependiendo del Environmental consultant de d?nde se surte la receta y alguna farmacias pueden ofrecer precios m?s baratos. ? ?El sitio web www.goodrx.com tiene cupones para medicamentos de Airline pilot. Los precios aqu? no tienen en cuenta lo que podr?a costar con la ayuda del seguro (puede ser m?s barato con su seguro), pero el sitio web puede darle el precio si no utiliz? ning?n seguro.  ?- Puede imprimir el cup?n correspondiente y llevarlo con su receta a la farmacia.  ?- Tambi?n puede pasar por nuestra oficina durante el horario de atenci?n regular y recoger una tarjeta de cupones de GoodRx.  ?- Si necesita que su receta se env?e electr?nicamente a Chiropodist, informe a nuestra oficina  a trav?s de MyChart [place] o por tel?fono llamando al [phone] y presione la opci?n 4. ? ?

## 2021-11-20 ENCOUNTER — Encounter: Payer: Self-pay | Admitting: Dermatology

## 2021-12-03 ENCOUNTER — Ambulatory Visit: Payer: BC Managed Care – PPO | Admitting: Dermatology

## 2022-03-12 ENCOUNTER — Ambulatory Visit: Payer: BC Managed Care – PPO | Admitting: Anesthesiology

## 2022-03-12 ENCOUNTER — Ambulatory Visit
Admission: RE | Admit: 2022-03-12 | Discharge: 2022-03-12 | Disposition: A | Discharge status: Home / Self Care | Payer: BC Managed Care – PPO | Attending: Internal Medicine | Admitting: Internal Medicine

## 2022-03-12 ENCOUNTER — Encounter: Payer: Self-pay | Admitting: Internal Medicine

## 2022-03-12 ENCOUNTER — Encounter
Admission: RE | Disposition: A | Discharge status: Home / Self Care | Payer: Self-pay | Source: Home / Self Care | Attending: Internal Medicine

## 2022-03-12 DIAGNOSIS — Z6836 Body mass index (BMI) 36.0-36.9, adult: Secondary | ICD-10-CM | POA: Diagnosis not present

## 2022-03-12 DIAGNOSIS — F17210 Nicotine dependence, cigarettes, uncomplicated: Secondary | ICD-10-CM | POA: Diagnosis not present

## 2022-03-12 DIAGNOSIS — I110 Hypertensive heart disease with heart failure: Secondary | ICD-10-CM | POA: Diagnosis not present

## 2022-03-12 DIAGNOSIS — E785 Hyperlipidemia, unspecified: Secondary | ICD-10-CM | POA: Insufficient documentation

## 2022-03-12 DIAGNOSIS — Z7901 Long term (current) use of anticoagulants: Secondary | ICD-10-CM | POA: Insufficient documentation

## 2022-03-12 DIAGNOSIS — G4700 Insomnia, unspecified: Secondary | ICD-10-CM | POA: Insufficient documentation

## 2022-03-12 DIAGNOSIS — Z955 Presence of coronary angioplasty implant and graft: Secondary | ICD-10-CM | POA: Diagnosis not present

## 2022-03-12 DIAGNOSIS — I480 Paroxysmal atrial fibrillation: Secondary | ICD-10-CM | POA: Diagnosis present

## 2022-03-12 DIAGNOSIS — Z79899 Other long term (current) drug therapy: Secondary | ICD-10-CM | POA: Insufficient documentation

## 2022-03-12 DIAGNOSIS — Z7982 Long term (current) use of aspirin: Secondary | ICD-10-CM | POA: Insufficient documentation

## 2022-03-12 DIAGNOSIS — I429 Cardiomyopathy, unspecified: Secondary | ICD-10-CM | POA: Insufficient documentation

## 2022-03-12 DIAGNOSIS — G4733 Obstructive sleep apnea (adult) (pediatric): Secondary | ICD-10-CM | POA: Diagnosis not present

## 2022-03-12 DIAGNOSIS — I509 Heart failure, unspecified: Secondary | ICD-10-CM | POA: Insufficient documentation

## 2022-03-12 DIAGNOSIS — E669 Obesity, unspecified: Secondary | ICD-10-CM | POA: Insufficient documentation

## 2022-03-12 DIAGNOSIS — I2510 Atherosclerotic heart disease of native coronary artery without angina pectoris: Secondary | ICD-10-CM | POA: Diagnosis not present

## 2022-03-12 DIAGNOSIS — J432 Centrilobular emphysema: Secondary | ICD-10-CM | POA: Insufficient documentation

## 2022-03-12 HISTORY — DX: Sleep apnea, unspecified: G47.30

## 2022-03-12 HISTORY — DX: Cardiac arrhythmia, unspecified: I49.9

## 2022-03-12 HISTORY — DX: Other pulmonary embolism without acute cor pulmonale: I26.99

## 2022-03-12 HISTORY — PX: CARDIOVERSION: SHX1299

## 2022-03-12 SURGERY — CARDIOVERSION
Anesthesia: General

## 2022-03-12 MED ORDER — ACETAMINOPHEN 325 MG PO TABS: 650.0000 mg | ORAL_TABLET | Freq: Once | ORAL | Status: DC | PRN

## 2022-03-12 MED ORDER — SODIUM CHLORIDE 0.9 % IV SOLN
INTRAVENOUS | Status: DC
Start: 2022-03-12 — End: 2022-03-12

## 2022-03-12 MED ORDER — ACETAMINOPHEN 160 MG/5ML PO SOLN: 325.0000 mg | ORAL | Status: DC | PRN

## 2022-03-12 MED ORDER — ONDANSETRON HCL 4 MG/2ML IJ SOLN: 4.0000 mg | Freq: Once | INTRAMUSCULAR | Status: DC | PRN

## 2022-03-12 MED ORDER — PROPOFOL 10 MG/ML IV BOLUS
INTRAVENOUS | Status: DC | PRN
  Administered 2022-03-12: 10 mg via INTRAVENOUS
  Administered 2022-03-12: 30 mg via INTRAVENOUS
  Administered 2022-03-12: 10 mg via INTRAVENOUS
  Administered 2022-03-12 (×2): 20 mg via INTRAVENOUS
  Administered 2022-03-12: 40 mg via INTRAVENOUS

## 2022-03-12 MED ORDER — SODIUM CHLORIDE 0.9 % IV SOLN: INTRAVENOUS | Status: DC | PRN

## 2022-03-12 NOTE — Transfer of Care (Signed)
Immediate Anesthesia Transfer of Care Note  Patient: Mason Frank  Procedure(s) Performed: CARDIOVERSION  Patient Location: PACU  Anesthesia Type:General  Level of Consciousness: awake and drowsy  Airway & Oxygen Therapy: Patient Spontanous Breathing and Patient connected to nasal cannula oxygen  Post-op Assessment: Report given to RN and Post -op Vital signs reviewed and stable  Post vital signs: Reviewed and stable  Last Vitals:  Vitals Value Taken Time  BP 97/63 03/12/22 0807  Temp    Pulse 61 03/12/22 0809  Resp 18 03/12/22 0809  SpO2 96 % 03/12/22 0809  Vitals shown include unvalidated device data.  Last Pain:  Vitals:   03/12/22 0720  TempSrc: Oral         Complications: No notable events documented.

## 2022-03-12 NOTE — Anesthesia Postprocedure Evaluation (Signed)
Anesthesia Post Note  Patient: Mason Frank  Procedure(s) Performed: CARDIOVERSION  Patient location during evaluation: PACU Anesthesia Type: General Level of consciousness: awake and alert, oriented and patient cooperative Pain management: pain level controlled Vital Signs Assessment: post-procedure vital signs reviewed and stable Respiratory status: spontaneous breathing, nonlabored ventilation and respiratory function stable Cardiovascular status: blood pressure returned to baseline and stable Postop Assessment: adequate PO intake Anesthetic complications: no   No notable events documented.   Last Vitals:  Vitals:   03/12/22 0815 03/12/22 0824  BP: (!) 83/61 (!) 83/61  Pulse:  (!) 56  Resp:  13  Temp:    SpO2:  98%    Last Pain:  Vitals:   03/12/22 0720  TempSrc: Oral                 Darrin Nipper

## 2022-03-12 NOTE — Anesthesia Preprocedure Evaluation (Addendum)
Anesthesia Evaluation  Patient identified by MRN, date of birth, ID band Patient awake    Reviewed: Allergy & Precautions, NPO status , Patient's Chart, lab work & pertinent test results  History of Anesthesia Complications Negative for: history of anesthetic complications  Airway Mallampati: IV   Neck ROM: Full    Dental  (+) Upper Dentures, Partial Lower   Pulmonary COPD, former smoker (quit 2019),    Pulmonary exam normal breath sounds clear to auscultation       Cardiovascular hypertension, + CAD (s/p stents) and +CHF (cardiomyopathy, EF 25%)  + dysrhythmias (a fib on Plavix)  Rhythm:Irregular Rate:Normal  Hx PE on Eliquis  Echo 02/04/22: REGIONALLY-IMPAIRED LEFT VENTRICLE WITH SEVERE LV SYSTOLIC DYSFUNCTION; CALCULATED EF = 25 % NORMAL RIGHT VENTRICULAR SYSTOLIC FUNCTION MODERATE MITRAL VALVE INSUFFICIENCY MILD TRICUSPID VALVE INSUFFICIENCY TRACE AORTIC VALVE INSUFFICIENCY NO VALVULAR STENOSIS MILD LV ENLARGEMENT MILD RV ENLARGEMENT MILD BIATRIAL ENLARGEMENT MILD LVH  Myocardial Perfusion 06/11/21: Abnormal myocardial perfusion scan large area of anterior apical lateral persistent defect suggestive of scar there is significant left ventricular enlargement globally ejection fraction of 26% studies consistent with large area of scar infarct suggestive of ischemic cardiomyopathy     Neuro/Psych Alcohol use disorder    GI/Hepatic negative GI ROS,   Endo/Other  negative endocrine ROS  Renal/GU negative Renal ROS     Musculoskeletal   Abdominal   Peds  Hematology negative hematology ROS (+)   Anesthesia Other Findings Cardiology note 03/02/22:  Plan   1 acute pulmonary embolus patient currently on Eliquis for anticoagulation Works as a Administrator recommend indefinite anticoagulation because of atrial fibrillation  2 cardiomyopathy systolic dysfunction known coronary disease ejection fraction around 25%  currently on heart failure cardiomyopathy current medications including Entresto metoprolol spironolactone torsemide  3 coronary disease status post PCI stent to LAD a year ago in May 2022 currently on aspirin now also on Eliquis  4 atrial fibrillation paroxysmal continue Eliquis for anticoagulation  5 obesity recommend weight loss exercise portion control  6 obstructive sleep apnea recommend sleep study CPAP weight loss  7 diabetes type 2 uncomplicated on empagliflozin recommend diet and exercise  8 hyperlipidemia continue Crestor therapy for lipid management  9 insomnia we will prescribe trazodone 200 mg at bedtime  10 recommend echocardiogram for evaluation of congestive heart failure cardiomyopathy shortness of breath  11 have the patient follow-up mid July 14 or 15 before he returns to work on the 19th   Reproductive/Obstetrics                           Anesthesia Physical Anesthesia Plan  ASA: 4  Anesthesia Plan: General   Post-op Pain Management:    Induction: Intravenous  PONV Risk Score and Plan: 2 and Propofol infusion, TIVA and Treatment may vary due to age or medical condition  Airway Management Planned: Natural Airway  Additional Equipment:   Intra-op Plan:   Post-operative Plan:   Informed Consent: I have reviewed the patients History and Physical, chart, labs and discussed the procedure including the risks, benefits and alternatives for the proposed anesthesia with the patient or authorized representative who has indicated his/her understanding and acceptance.       Plan Discussed with: CRNA  Anesthesia Plan Comments: (LMA/GETA backup discussed.  Patient consented for risks of anesthesia including but not limited to:  - adverse reactions to medications - damage to eyes, teeth, lips or other oral mucosa - nerve damage due  to positioning  - sore throat or hoarseness - damage to heart, brain, nerves, lungs, other parts of body  or loss of life  Informed patient about role of CRNA in peri- and intra-operative care.  Patient voiced understanding.)        Anesthesia Quick Evaluation

## 2022-03-12 NOTE — Anesthesia Procedure Notes (Signed)
Date/Time: 03/12/2022 7:54 AM  Performed by: Johnna Acosta, CRNAPre-anesthesia Checklist: Patient identified, Emergency Drugs available, Suction available, Patient being monitored and Timeout performed Patient Re-evaluated:Patient Re-evaluated prior to induction Oxygen Delivery Method: Nasal cannula Preoxygenation: Pre-oxygenation with 100% oxygen Induction Type: IV induction

## 2022-03-13 ENCOUNTER — Encounter: Payer: Self-pay | Admitting: Internal Medicine

## 2022-03-13 NOTE — CV Procedure (Signed)
Electrical Cardioversion Procedure Note   Procedure: Electrical Cardioversion Indications:  Atrial Fibrillation  Procedure Details Consent: Risks of procedure as well as the alternatives and risks of each were explained to the (patient/caregiver).  Consent for procedure obtained. Time Out: Verified patient identification, verified procedure, site/side was marked, verified correct patient position, special equipment/implants available, medications/allergies/relevent history reviewed, required imaging and test results available.  Performed  Patient placed on cardiac monitor, pulse oximetry, supplemental oxygen as necessary.  Sedation given: Propofol as per anesthesia Pacer pads placed anterior and posterior chest.  Cardioverted  4  time(s).  Cardioverted at [place].  Evaluation Findings: Post procedure EKG shows: NSR Complications: None Patient did tolerate procedure well.   Lujean Amel MD 03/12/2022

## 2022-03-18 ENCOUNTER — Ambulatory Visit: Payer: BC Managed Care – PPO | Admitting: Dermatology

## 2022-05-26 ENCOUNTER — Ambulatory Visit: Payer: BC Managed Care – PPO | Admitting: Dermatology

## 2022-06-15 ENCOUNTER — Encounter: Payer: BC Managed Care – PPO | Attending: Internal Medicine

## 2022-06-15 ENCOUNTER — Other Ambulatory Visit: Payer: Self-pay

## 2022-06-15 DIAGNOSIS — I5042 Chronic combined systolic (congestive) and diastolic (congestive) heart failure: Secondary | ICD-10-CM | POA: Insufficient documentation

## 2022-06-15 NOTE — Progress Notes (Signed)
Virtual Visit completed. Patient informed on EP and RD appointment and 6 Minute walk test. Patient also informed of patient health questionnaires on My Chart. Patient Verbalizes understanding. Visit diagnosis can be found in [facility] 04/08/2022.

## 2022-06-22 ENCOUNTER — Encounter: Payer: BC Managed Care – PPO | Admitting: *Deleted

## 2022-06-22 VITALS — Ht 75.6 in | Wt 304.1 lb

## 2022-06-22 DIAGNOSIS — I5042 Chronic combined systolic (congestive) and diastolic (congestive) heart failure: Secondary | ICD-10-CM | POA: Diagnosis not present

## 2022-06-22 NOTE — Patient Instructions (Signed)
Patient Instructions  Patient Details  Name: Mason Frank MRN: [id number] Date of Birth: 02-May-1961 Referring Provider:  Yolonda Kida, MD  Below are your personal goals for exercise, nutrition, and risk factors. Our goal is to help you stay on track towards obtaining and maintaining these goals. We will be discussing your progress on these goals with you throughout the program.  Initial Exercise Prescription:  Initial Exercise Prescription - 06/22/22 1200       Date of Initial Exercise RX and Referring Provider   Date 06/22/22    Referring Provider Lujean Amel MD      Oxygen   Maintain Oxygen Saturation 88% or higher      Treadmill   MPH 1.8    Grade 0.5    Minutes 15    METs 2.5      Recumbant Bike   Level 2    RPM 50    Watts 32    Minutes 15    METs 2.5      NuStep   Level 2    SPM 80    Minutes 15    METs 2.5      Track   Laps 26    Minutes 15    METs 2.41      Prescription Details   Frequency (times per week) 3    Duration Progress to 30 minutes of continuous aerobic without signs/symptoms of physical distress      Intensity   THRR 40-80% of Max Heartrate 115-144    Ratings of Perceived Exertion 11-13    Perceived Dyspnea 0-4      Progression   Progression Continue to progress workloads to maintain intensity without signs/symptoms of physical distress.      Resistance Training   Training Prescription Yes    Weight 4 lb    Reps 10-15             Exercise Goals: Frequency: Be able to perform aerobic exercise two to three times per week in program working toward 2-5 days per week of home exercise.  Intensity: Work with a perceived exertion of 11 (fairly light) - 15 (hard) while following your exercise prescription.  We will make changes to your prescription with you as you progress through the program.   Duration: Be able to do 30 to 45 minutes of continuous aerobic exercise in addition to a 5 minute warm-up and a 5 minute cool-down  routine.   Nutrition Goals: Your personal nutrition goals will be established when you do your nutrition analysis with the dietician.  The following are general nutrition guidelines to follow: Cholesterol < '200mg'$ /day Sodium < '1500mg'$ /day Fiber: Men over 50 yrs - 30 grams per day  Personal Goals:  Personal Goals and Risk Factors at Admission - 06/22/22 1253       Core Components/Risk Factors/Patient Goals on Admission    Weight Management Yes;Weight Loss;Obesity    Intervention Weight Management/Obesity: Establish reasonable short term and long term weight goals.;Weight Management: Develop a combined nutrition and exercise program designed to reach desired caloric intake, while maintaining appropriate intake of nutrient and fiber, sodium and fats, and appropriate energy expenditure required for the weight goal.;Weight Management: Provide education and appropriate resources to help participant work on and attain dietary goals.;Obesity: Provide education and appropriate resources to help participant work on and attain dietary goals.    Admit Weight 304 lb 1.6 oz (137.9 kg)    Goal Weight: Short Term 299 lb (135.6 kg)  Goal Weight: Long Term 269 lb (122 kg)    Expected Outcomes Short Term: Continue to assess and modify interventions until short term weight is achieved;Long Term: Adherence to nutrition and physical activity/exercise program aimed toward attainment of established weight goal;Weight Loss: Understanding of general recommendations for a balanced deficit meal plan, which promotes 1-2 lb weight loss per week and includes a negative energy balance of [phone] kcal/d;Understanding recommendations for meals to include 15-35% energy as protein, 25-35% energy from fat, 35-60% energy from carbohydrates, less than '200mg'$  of dietary cholesterol, 20-35 gm of total fiber daily;Understanding of distribution of calorie intake throughout the day with the consumption of 4-5 meals/snacks    Heart Failure  Yes    Intervention Provide a combined exercise and nutrition program that is supplemented with education, support and counseling about heart failure. Directed toward relieving symptoms such as shortness of breath, decreased exercise tolerance, and extremity edema.    Expected Outcomes Improve functional capacity of life;Short term: Attendance in program 2-3 days a week with increased exercise capacity. Reported lower sodium intake. Reported increased fruit and vegetable intake. Reports medication compliance.;Short term: Daily weights obtained and reported for increase. Utilizing diuretic protocols set by physician.;Long term: Adoption of self-care skills and reduction of barriers for early signs and symptoms recognition and intervention leading to self-care maintenance.    Hypertension Yes    Intervention Provide education on lifestyle modifcations including regular physical activity/exercise, weight management, moderate sodium restriction and increased consumption of fresh fruit, vegetables, and low fat dairy, alcohol moderation, and smoking cessation.;Monitor prescription use compliance.    Expected Outcomes Short Term: Continued assessment and intervention until BP is < 140/25m HG in hypertensive participants. < 130/845mHG in hypertensive participants with diabetes, heart failure or chronic kidney disease.;Long Term: Maintenance of blood pressure at goal levels.    Lipids Yes    Intervention Provide education and support for participant on nutrition & aerobic/resistive exercise along with prescribed medications to achieve LDL '70mg'$ , HDL >'40mg'$ .    Expected Outcomes Short Term: Participant states understanding of desired cholesterol values and is compliant with medications prescribed. Participant is following exercise prescription and nutrition guidelines.;Long Term: Cholesterol controlled with medications as prescribed, with individualized exercise RX and with personalized nutrition plan. Value goals:  LDL < '70mg'$ , HDL > 40 mg.             Tobacco Use Initial Evaluation: Social History   Tobacco Use  Smoking Status Former   Packs/day: 1.00   Years: 45.00   Total pack years: 45.00   Types: Cigarettes   Quit date: 07/24/2021   Years since quitting: 0.9  Smokeless Tobacco Never    Exercise Goals and Review:  Exercise Goals     Row Name 06/22/22 1250             Exercise Goals   Increase Physical Activity Yes       Intervention Provide advice, education, support and counseling about physical activity/exercise needs.;Develop an individualized exercise prescription for aerobic and resistive training based on initial evaluation findings, risk stratification, comorbidities and participant's personal goals.       Expected Outcomes Short Term: Attend rehab on a regular basis to increase amount of physical activity.;Long Term: Add in home exercise to make exercise part of routine and to increase amount of physical activity.;Long Term: Exercising regularly at least 3-5 days a week.       Increase Strength and Stamina Yes       Intervention Provide advice, education,  support and counseling about physical activity/exercise needs.;Develop an individualized exercise prescription for aerobic and resistive training based on initial evaluation findings, risk stratification, comorbidities and participant's personal goals.       Expected Outcomes Short Term: Increase workloads from initial exercise prescription for resistance, speed, and METs.;Short Term: Perform resistance training exercises routinely during rehab and add in resistance training at home;Long Term: Improve cardiorespiratory fitness, muscular endurance and strength as measured by increased METs and functional capacity (6MWT)       Able to understand and use rate of perceived exertion (RPE) scale Yes       Intervention Provide education and explanation on how to use RPE scale       Expected Outcomes Short Term: Able to use RPE daily  in rehab to express subjective intensity level;Long Term:  Able to use RPE to guide intensity level when exercising independently       Able to understand and use Dyspnea scale Yes       Intervention Provide education and explanation on how to use Dyspnea scale       Expected Outcomes Short Term: Able to use Dyspnea scale daily in rehab to express subjective sense of shortness of breath during exertion;Long Term: Able to use Dyspnea scale to guide intensity level when exercising independently       Knowledge and understanding of Target Heart Rate Range (THRR) Yes       Intervention Provide education and explanation of THRR including how the numbers were predicted and where they are located for reference       Expected Outcomes Short Term: Able to state/look up THRR;Short Term: Able to use daily as guideline for intensity in rehab;Long Term: Able to use THRR to govern intensity when exercising independently       Able to check pulse independently Yes       Intervention Provide education and demonstration on how to check pulse in carotid and radial arteries.;Review the importance of being able to check your own pulse for safety during independent exercise       Expected Outcomes Short Term: Able to explain why pulse checking is important during independent exercise;Long Term: Able to check pulse independently and accurately       Understanding of Exercise Prescription Yes       Intervention Provide education, explanation, and written materials on patient's individual exercise prescription       Expected Outcomes Short Term: Able to explain program exercise prescription;Long Term: Able to explain home exercise prescription to exercise independently                Copy of goals given to participant.

## 2022-06-22 NOTE — Progress Notes (Signed)
Cardiac Individual Treatment Plan  Patient Details  Name: Mason Frank MRN: [id number] Date of Birth: May 13, 1961 Referring Provider:   Flowsheet Row Cardiac Rehab from 06/22/2022 in [facility] Cardiac and Pulmonary Rehab  Referring Provider Lujean Amel MD       Initial Encounter Date:  Flowsheet Row Cardiac Rehab from 06/22/2022 in [facility] Cardiac and Pulmonary Rehab  Date 06/22/22       Visit Diagnosis: Heart failure, systolic and diastolic, chronic ([place])  Patient's Home Medications on Admission:  Current Outpatient Medications:    allopurinol (ZYLOPRIM) 100 MG tablet, Take 100 mg by mouth daily at 12 noon. (Patient not taking: Reported on 06/15/2022), Disp: , Rfl:    allopurinol (ZYLOPRIM) 100 MG tablet, Take 1 tablet by mouth daily., Disp: , Rfl:    apixaban (ELIQUIS) 5 MG TABS tablet, Take 5 mg by mouth 2 (two) times daily. (Patient not taking: Reported on 06/15/2022), Disp: , Rfl:    apixaban (ELIQUIS) 5 MG TABS tablet, Take by mouth., Disp: , Rfl:    aspirin EC 81 MG tablet, Take 81 mg by mouth daily at 12 noon. (Patient not taking: Reported on 06/15/2022), Disp: , Rfl:    aspirin EC 81 MG tablet, Take by mouth., Disp: , Rfl:    atorvastatin (LIPITOR) 20 MG tablet, Take by mouth. (Patient not taking: Reported on 06/15/2022), Disp: , Rfl:    clopidogrel (PLAVIX) 300 MG TABS tablet, Take by mouth., Disp: , Rfl:    colchicine 0.6 MG tablet, Take 0.6 mg by mouth daily as needed (gout flares). (Patient not taking: Reported on 07/02/2021), Disp: , Rfl:    colchicine 0.6 MG tablet, Take 2 tablets (1.'2mg'$ ) by mouth at first sign of gout flare followed by 1 tablet (0.'6mg'$ ) after 1 hour. (Max 1.'8mg'$  within 1 hour), Disp: , Rfl:    doxycycline (VIBRAMYCIN) 50 MG capsule, Take 1 capsule (50 mg total) by mouth daily. Take with food. (Patient not taking: Reported on 03/12/2022), Disp: 90 capsule, Rfl: 1   empagliflozin (JARDIANCE) 10 MG TABS tablet, Take 10 mg by mouth daily. (Patient not taking:  Reported on 06/15/2022), Disp: , Rfl:    empagliflozin (JARDIANCE) 10 MG TABS tablet, Take 1 tablet by mouth daily., Disp: , Rfl:    Fluticasone-Umeclidin-Vilant (TRELEGY ELLIPTA) 100-62.5-25 MCG/ACT AEPB, Inhale into the lungs daily. (Patient not taking: Reported on 06/15/2022), Disp: , Rfl:    Fluticasone-Umeclidin-Vilant (TRELEGY ELLIPTA) 100-62.5-25 MCG/ACT AEPB, Inhale into the lungs., Disp: , Rfl:    furosemide (LASIX) 20 MG tablet, Take 20 mg by mouth daily at 12 noon. (Patient not taking: Reported on 03/12/2022), Disp: , Rfl:    ketoconazole (NIZORAL) 2 % cream, For fungal infection apply to the feet QHS., Disp: 60 g, Rfl: 6   ketoconazole (NIZORAL) 2 % cream, Apply topically. (Patient not taking: Reported on 06/15/2022), Disp: , Rfl:    losartan (COZAAR) 100 MG tablet, Take 100 mg by mouth daily at 12 noon. (Patient not taking: Reported on 06/15/2022), Disp: , Rfl:    metoprolol succinate (TOPROL-XL) 25 MG 24 hr tablet, Take by mouth daily at 12 noon. (Patient not taking: Reported on 06/15/2022), Disp: , Rfl:    metoprolol succinate (TOPROL-XL) 25 MG 24 hr tablet, Take 1 tablet by mouth daily., Disp: , Rfl:    naproxen sodium (ALEVE) 220 MG tablet, Take 440 mg by mouth daily as needed (pain). (Patient not taking: Reported on 06/15/2022), Disp: , Rfl:    potassium chloride (KLOR-CON) 20 MEQ packet, Take 20 mEq  by mouth daily at 12 noon. (Patient not taking: Reported on 07/02/2021), Disp: , Rfl:    rosuvastatin (CRESTOR) 20 MG tablet, Take by mouth., Disp: , Rfl:    rosuvastatin (CRESTOR) 20 MG tablet, Take by mouth., Disp: , Rfl:    sacubitril-valsartan (ENTRESTO) 24-26 MG, Take 1 tablet by mouth 2 (two) times daily. (Patient not taking: Reported on 06/15/2022), Disp: , Rfl:    sacubitril-valsartan (ENTRESTO) 24-26 MG, Take 1 tablet by mouth 2 (two) times daily., Disp: , Rfl:    spironolactone (ALDACTONE) 25 MG tablet, Take 25 mg by mouth daily., Disp: , Rfl:    torsemide (DEMADEX) 20 MG  tablet, Take 20 mg by mouth daily. (Patient not taking: Reported on 06/15/2022), Disp: , Rfl:    TraZODone & Diet Manage Prod (TRAZAMINE PO), Take by mouth. 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   Dysrhythmia    Pulmonary embolism ([place])    Sleep apnea     Tobacco Use: Social History   Tobacco Use  Smoking Status Former   Packs/day: 1.00   Years: 45.00   Total pack years: 45.00   Types: Cigarettes   Quit date: 07/24/2021   Years since quitting: 0.9  Smokeless Tobacco Never    Labs: Review Flowsheet       Latest Ref Rng & Units 10/17/2012  Labs for ITP Cardiac and Pulmonary Rehab  Cholestrol 0 - 200 mg/dL 239   LDL (calc) 0 - 100 mg/dL 164   HDL-C 40 - 60 mg/dL 59   Trlycerides 0 - 200 mg/dL 81      Exercise Target Goals: Exercise Program Goal: Individual exercise prescription set using results from initial 6 min walk test and THRR while considering  patient's activity barriers and safety.   Exercise Prescription Goal: Initial exercise prescription builds to 30-45 minutes a day of aerobic activity, 2-3 days per week.  Home exercise guidelines will be given to patient during program as part of exercise prescription that the participant will acknowledge.   Education: Aerobic Exercise: - Group verbal and visual presentation on the components of exercise prescription. Introduces F.I.T.T principle from ACSM for exercise prescriptions.  Reviews F.I.T.T. principles of aerobic exercise including progression. Written material given at graduation.   Education: Resistance Exercise: - Group verbal and visual presentation on the components of exercise prescription. Introduces F.I.T.T principle from ACSM for exercise prescriptions  Reviews F.I.T.T.  principles of resistance exercise including progression. Written material given at graduation.    Education: Exercise & Equipment Safety: - Individual verbal instruction and demonstration of equipment use and safety with use of the equipment. Flowsheet Row Cardiac Rehab from 06/22/2022 in [facility] Cardiac and Pulmonary Rehab  Date 06/15/22  Educator [facility]  Instruction Review Code 1- Verbalizes Understanding       Education: Exercise Physiology & General Exercise Guidelines: - Group verbal and written instruction with models to review the exercise physiology of the cardiovascular system and associated critical values. Provides general exercise guidelines with specific guidelines to those with heart or lung disease.    Education: Flexibility, Balance, Mind/Body Relaxation: - Group verbal and visual presentation with interactive activity on the components of exercise prescription. Introduces F.I.T.T principle from ACSM for exercise prescriptions. Reviews F.I.T.T. principles  of flexibility and balance exercise training including progression. Also discusses the mind body connection.  Reviews various relaxation techniques to help reduce and manage stress (i.e. Deep breathing, progressive muscle relaxation, and visualization). Balance handout provided to take home. Written material given at graduation.   Activity Barriers & Risk Stratification:  Activity Barriers & Cardiac Risk Stratification - 06/22/22 1246       Activity Barriers & Cardiac Risk Stratification   Activity Barriers Shortness of Breath;Decreased Ventricular Function;Balance Concerns;Muscular Weakness;Deconditioning    Cardiac Risk Stratification High             6 Minute Walk:  6 Minute Walk     Row Name 06/22/22 1244         6 Minute Walk   Phase Initial     Distance 1045 feet     Walk Time 6 minutes     # of Rest Breaks 0     MPH 1.98     METS 2.76     RPE 13     Perceived Dyspnea  2     VO2 Peak 9.67      Symptoms Yes (comment)     Comments SOB     Resting HR 87 bpm     Resting BP 126/64     Resting Oxygen Saturation  97 %     Exercise Oxygen Saturation  during 6 min walk 97 %     Max Ex. HR 132 bpm     Max Ex. BP 148/74     2 Minute Post BP 134/72              Oxygen Initial Assessment:   Oxygen Re-Evaluation:   Oxygen Discharge (Final Oxygen Re-Evaluation):   Initial Exercise Prescription:  Initial Exercise Prescription - 06/22/22 1200       Date of Initial Exercise RX and Referring Provider   Date 06/22/22    Referring Provider Lujean Amel MD      Oxygen   Maintain Oxygen Saturation 88% or higher      Treadmill   MPH 1.8    Grade 0.5    Minutes 15    METs 2.5      Recumbant Bike   Level 2    RPM 50    Watts 32    Minutes 15    METs 2.5      NuStep   Level 2    SPM 80    Minutes 15    METs 2.5      Track   Laps 26    Minutes 15    METs 2.41      Prescription Details   Frequency (times per week) 3    Duration Progress to 30 minutes of continuous aerobic without signs/symptoms of physical distress      Intensity   THRR 40-80% of Max Heartrate 115-144    Ratings of Perceived Exertion 11-13    Perceived Dyspnea 0-4      Progression   Progression Continue to progress workloads to maintain intensity without signs/symptoms of physical distress.      Resistance Training   Training Prescription Yes    Weight 4 lb    Reps 10-15             Perform Capillary Blood Glucose checks as needed.  Exercise Prescription Changes:   Exercise Prescription Changes     Row Name 06/22/22 1200             Response  to Exercise   Blood Pressure (Admit) 126/64       Blood Pressure (Exercise) 148/74       Blood Pressure (Exit) 134/72       Heart Rate (Admit) 81 bpm       Heart Rate (Exercise) 132 bpm       Heart Rate (Exit) 96 bpm       Oxygen Saturation (Admit) 97 %       Oxygen Saturation (Exercise) 97 %       Rating of Perceived  Exertion (Exercise) 13       Perceived Dyspnea (Exercise) 2       Symptoms SOB       Comments walk test results                Exercise Comments:   Exercise Goals and Review:   Exercise Goals     Row Name 06/22/22 1250             Exercise Goals   Increase Physical Activity Yes       Intervention Provide advice, education, support and counseling about physical activity/exercise needs.;Develop an individualized exercise prescription for aerobic and resistive training based on initial evaluation findings, risk stratification, comorbidities and participant's personal goals.       Expected Outcomes Short Term: Attend rehab on a regular basis to increase amount of physical activity.;Long Term: Add in home exercise to make exercise part of routine and to increase amount of physical activity.;Long Term: Exercising regularly at least 3-5 days a week.       Increase Strength and Stamina Yes       Intervention Provide advice, education, support and counseling about physical activity/exercise needs.;Develop an individualized exercise prescription for aerobic and resistive training based on initial evaluation findings, risk stratification, comorbidities and participant's personal goals.       Expected Outcomes Short Term: Increase workloads from initial exercise prescription for resistance, speed, and METs.;Short Term: Perform resistance training exercises routinely during rehab and add in resistance training at home;Long Term: Improve cardiorespiratory fitness, muscular endurance and strength as measured by increased METs and functional capacity (6MWT)       Able to understand and use rate of perceived exertion (RPE) scale Yes       Intervention Provide education and explanation on how to use RPE scale       Expected Outcomes Short Term: Able to use RPE daily in rehab to express subjective intensity level;Long Term:  Able to use RPE to guide intensity level when exercising independently        Able to understand and use Dyspnea scale Yes       Intervention Provide education and explanation on how to use Dyspnea scale       Expected Outcomes Short Term: Able to use Dyspnea scale daily in rehab to express subjective sense of shortness of breath during exertion;Long Term: Able to use Dyspnea scale to guide intensity level when exercising independently       Knowledge and understanding of Target Heart Rate Range (THRR) Yes       Intervention Provide education and explanation of THRR including how the numbers were predicted and where they are located for reference       Expected Outcomes Short Term: Able to state/look up THRR;Short Term: Able to use daily as guideline for intensity in rehab;Long Term: Able to use THRR to govern intensity when exercising independently       Able to check pulse  independently Yes       Intervention Provide education and demonstration on how to check pulse in carotid and radial arteries.;Review the importance of being able to check your own pulse for safety during independent exercise       Expected Outcomes Short Term: Able to explain why pulse checking is important during independent exercise;Long Term: Able to check pulse independently and accurately       Understanding of Exercise Prescription Yes       Intervention Provide education, explanation, and written materials on patient's individual exercise prescription       Expected Outcomes Short Term: Able to explain program exercise prescription;Long Term: Able to explain home exercise prescription to exercise independently                Exercise Goals Re-Evaluation :   Discharge Exercise Prescription (Final Exercise Prescription Changes):  Exercise Prescription Changes - 06/22/22 1200       Response to Exercise   Blood Pressure (Admit) 126/64    Blood Pressure (Exercise) 148/74    Blood Pressure (Exit) 134/72    Heart Rate (Admit) 81 bpm    Heart Rate (Exercise) 132 bpm    Heart Rate (Exit) 96  bpm    Oxygen Saturation (Admit) 97 %    Oxygen Saturation (Exercise) 97 %    Rating of Perceived Exertion (Exercise) 13    Perceived Dyspnea (Exercise) 2    Symptoms SOB    Comments walk test results             Nutrition:  Target Goals: Understanding of nutrition guidelines, daily intake of sodium '1500mg'$ , cholesterol '200mg'$ , calories 30% from fat and 7% or less from saturated fats, daily to have 5 or more servings of fruits and vegetables.  Education: All About Nutrition: -Group instruction provided by verbal, written material, interactive activities, discussions, models, and posters to present general guidelines for heart healthy nutrition including fat, fiber, MyPlate, the role of sodium in heart healthy nutrition, utilization of the nutrition label, and utilization of this knowledge for meal planning. Follow up email sent as well. Written material given at graduation.   Biometrics:  Pre Biometrics - 06/22/22 1251       Pre Biometrics   Height 6' 3.6" (1.92 m)    Weight 304 lb 1.6 oz (137.9 kg)    Waist Circumference 45 inches    Hip Circumference 48 inches    Waist to Hip Ratio 0.94 %    BMI (Calculated) 37.42    Single Leg Stand 8.8 seconds              Nutrition Therapy Plan and Nutrition Goals:  Nutrition Therapy & Goals - 06/22/22 0912       Personal Nutrition Goals   Comments B: cereal (2% milk and raisin bran or kashi) with OJ L: [place] food out to eat (chicken soup) or PB and J sandwich (whole grain natures own) D: sometimes will not eat (will have [place] food) Drinks: water.      Intervention Plan   Intervention Prescribe, educate and counsel regarding individualized specific dietary modifications aiming towards targeted core components such as weight, hypertension, lipid management, diabetes, heart failure and other comorbidities.;Nutrition handout(s) given to patient.    Expected Outcomes Short Term Goal: Understand basic principles of dietary  content, such as calories, fat, sodium, cholesterol and nutrients.;Short Term Goal: A plan has been developed with personal nutrition goals set during dietitian appointment.;Long Term Goal: Adherence to prescribed nutrition plan.  Nutrition Assessments:  MEDIFICTS Score Key: ?70 Need to make dietary changes  40-70 Heart Healthy Diet ? 40 Therapeutic Level Cholesterol Diet  Flowsheet Row Cardiac Rehab from 06/22/2022 in [facility] Cardiac and Pulmonary Rehab  Picture Your Plate Total Score on Admission 70      Picture Your Plate Scores: <85 Unhealthy dietary pattern with much room for improvement. 41-50 Dietary pattern unlikely to meet recommendations for good health and room for improvement. 51-60 More healthful dietary pattern, with some room for improvement.  >60 Healthy dietary pattern, although there may be some specific behaviors that could be improved.    Nutrition Goals Re-Evaluation:   Nutrition Goals Discharge (Final Nutrition Goals Re-Evaluation):   Psychosocial: Target Goals: Acknowledge presence or absence of significant depression and/or stress, maximize coping skills, provide positive support system. Participant is able to verbalize types and ability to use techniques and skills needed for reducing stress and depression.   Education: Stress, Anxiety, and Depression - Group verbal and visual presentation to define topics covered.  Reviews how body is impacted by stress, anxiety, and depression.  Also discusses healthy ways to reduce stress and to treat/manage anxiety and depression.  Written material given at graduation.   Education: Sleep Hygiene -Provides group verbal and written instruction about how sleep can affect your health.  Define sleep hygiene, discuss sleep cycles and impact of sleep habits. Review good sleep hygiene tips.    Initial Review & Psychosocial Screening:  Initial Psych Review & Screening - 06/15/22 1012       Initial Review    Current issues with Current Stress Concerns    Source of Stress Concerns Occupation    Comments He has lost his job recently and is doing ok with being out of work. He is ready to get his health back in order so he can work a less demanding job. He does not take any medicaions for his mood.      Family Dynamics   Good Support System? Yes    Comments He can look to his wife for support, sisters and brother. His two daughters are nearby also.      Barriers   Psychosocial barriers to participate in program The patient should benefit from training in stress management and relaxation.;There are no identifiable barriers or psychosocial needs.      Screening Interventions   Interventions Encouraged to exercise;To provide support and resources with identified psychosocial needs;Provide feedback about the scores to participant    Expected Outcomes Short Term goal: Utilizing psychosocial counselor, staff and physician to assist with identification of specific Stressors or current issues interfering with healing process. Setting desired goal for each stressor or current issue identified.;Long Term Goal: Stressors or current issues are controlled or eliminated.;Short Term goal: Identification and review with participant of any Quality of Life or Depression concerns found by scoring the questionnaire.;Long Term goal: The participant improves quality of Life and PHQ9 Scores as seen by post scores and/or verbalization of changes             Quality of Life Scores:   Quality of Life - 06/22/22 1251       Quality of Life   Select Quality of Life      Quality of Life Scores   Health/Function Pre 20.07 %    Socioeconomic Pre 21.5 %    Psych/Spiritual Pre 22.86 %    Family Pre 24 %    GLOBAL Pre 21.51 %  Scores of 19 and below usually indicate a poorer quality of life in these areas.  A difference of  2-3 points is a clinically meaningful difference.  A difference of 2-3 points in the  total score of the Quality of Life Index has been associated with significant improvement in overall quality of life, self-image, physical symptoms, and general health in studies assessing change in quality of life.  PHQ-9: Review Flowsheet       06/22/2022 06/15/2019 04/26/2019  Depression screen PHQ 2/9  Decreased Interest 0 0 1  Down, Depressed, Hopeless 0 0 0  PHQ - 2 Score 0 0 1  Altered sleeping 1 0 0  Tired, decreased energy '1 1 1  '$ Change in appetite 1 1 0  Feeling bad or failure about yourself  0 0 0  Trouble concentrating 0 0 0  Moving slowly or fidgety/restless 0 0 0  Suicidal thoughts 0 0 0  PHQ-9 Score '3 2 2  '$ Difficult doing work/chores Not difficult at all Not difficult at all Not difficult at all   Interpretation of Total Score  Total Score Depression Severity:  1-4 = Minimal depression, 5-9 = Mild depression, 10-14 = Moderate depression, 15-19 = Moderately severe depression, 20-27 = Severe depression   Psychosocial Evaluation and Intervention:  Psychosocial Evaluation - 06/15/22 1015       Psychosocial Evaluation & Interventions   Interventions Encouraged to exercise with the program and follow exercise prescription;Relaxation education;Stress management education    Comments He has lost his job recently and is doing ok with being out of work. He is ready to get his health back in order so he can work a less demanding job. He does not take any medicaions for his mood.    Expected Outcomes Short: Start HeartTrack to help with mood. Long: Maintain a healthy mental state    Continue Psychosocial Services  Follow up required by staff             Psychosocial Re-Evaluation:   Psychosocial Discharge (Final Psychosocial Re-Evaluation):   Vocational Rehabilitation: Provide vocational rehab assistance to qualifying candidates.   Vocational Rehab Evaluation & Intervention:  Vocational Rehab - 06/22/22 1252       Initial Vocational Rehab Evaluation &  Intervention   Assessment shows need for Vocational Rehabilitation No             Education: Education Goals: Education classes will be provided on a variety of topics geared toward better understanding of heart health and risk factor modification. Participant will state understanding/return demonstration of topics presented as noted by education test scores.  Learning Barriers/Preferences:  Learning Barriers/Preferences - 06/15/22 1012       Learning Barriers/Preferences   Learning Barriers None    Learning Preferences None             General Cardiac Education Topics:  AED/CPR: - Group verbal and written instruction with the use of models to demonstrate the basic use of the AED with the basic ABC's of resuscitation.   Anatomy and Cardiac Procedures: - Group verbal and visual presentation and models provide information about basic cardiac anatomy and function. Reviews the testing methods done to diagnose heart disease and the outcomes of the test results. Describes the treatment choices: Medical Management, Angioplasty, or Coronary Bypass Surgery for treating various heart conditions including Myocardial Infarction, Angina, Valve Disease, and Cardiac Arrhythmias.  Written material given at graduation. Flowsheet Row Cardiac Rehab from 06/22/2022 in [facility] Cardiac and Pulmonary Rehab  Education need  identified 06/22/22       Medication Safety: - Group verbal and visual instruction to review commonly prescribed medications for heart and lung disease. Reviews the medication, class of the drug, and side effects. Includes the steps to properly store meds and maintain the prescription regimen.  Written material given at graduation.   Intimacy: - Group verbal instruction through game format to discuss how heart and lung disease can affect sexual intimacy. Written material given at graduation..   Know Your Numbers and Heart Failure: - Group verbal and visual instruction to  discuss disease risk factors for cardiac and pulmonary disease and treatment options.  Reviews associated critical values for Overweight/Obesity, Hypertension, Cholesterol, and Diabetes.  Discusses basics of heart failure: signs/symptoms and treatments.  Introduces Heart Failure Zone chart for action plan for heart failure.  Written material given at graduation.   Infection Prevention: - Provides verbal and written material to individual with discussion of infection control including proper hand washing and proper equipment cleaning during exercise session. Flowsheet Row Cardiac Rehab from 06/22/2022 in [facility] Cardiac and Pulmonary Rehab  Date 06/15/22  Educator [facility]  Instruction Review Code 1- Verbalizes Understanding       Falls Prevention: - Provides verbal and written material to individual with discussion of falls prevention and safety. Flowsheet Row Cardiac Rehab from 06/22/2022 in [facility] Cardiac and Pulmonary Rehab  Date 06/15/22  Educator [facility]  Instruction Review Code 1- Verbalizes Understanding       Other: -Provides group and verbal instruction on various topics (see comments)   Knowledge Questionnaire Score:  Knowledge Questionnaire Score - 06/22/22 1252       Knowledge Questionnaire Score   Pre Score 24/26             Core Components/Risk Factors/Patient Goals at Admission:  Personal Goals and Risk Factors at Admission - 06/22/22 1253       Core Components/Risk Factors/Patient Goals on Admission    Weight Management Yes;Weight Loss;Obesity    Intervention Weight Management/Obesity: Establish reasonable short term and long term weight goals.;Weight Management: Develop a combined nutrition and exercise program designed to reach desired caloric intake, while maintaining appropriate intake of nutrient and fiber, sodium and fats, and appropriate energy expenditure required for the weight goal.;Weight Management: Provide education and appropriate resources to help  participant work on and attain dietary goals.;Obesity: Provide education and appropriate resources to help participant work on and attain dietary goals.    Admit Weight 304 lb 1.6 oz (137.9 kg)    Goal Weight: Short Term 299 lb (135.6 kg)    Goal Weight: Long Term 269 lb (122 kg)    Expected Outcomes Short Term: Continue to assess and modify interventions until short term weight is achieved;Long Term: Adherence to nutrition and physical activity/exercise program aimed toward attainment of established weight goal;Weight Loss: Understanding of general recommendations for a balanced deficit meal plan, which promotes 1-2 lb weight loss per week and includes a negative energy balance of [phone] kcal/d;Understanding recommendations for meals to include 15-35% energy as protein, 25-35% energy from fat, 35-60% energy from carbohydrates, less than '200mg'$  of dietary cholesterol, 20-35 gm of total fiber daily;Understanding of distribution of calorie intake throughout the day with the consumption of 4-5 meals/snacks    Heart Failure Yes    Intervention Provide a combined exercise and nutrition program that is supplemented with education, support and counseling about heart failure. Directed toward relieving symptoms such as shortness of breath, decreased exercise tolerance, and extremity edema.  Expected Outcomes Improve functional capacity of life;Short term: Attendance in program 2-3 days a week with increased exercise capacity. Reported lower sodium intake. Reported increased fruit and vegetable intake. Reports medication compliance.;Short term: Daily weights obtained and reported for increase. Utilizing diuretic protocols set by physician.;Long term: Adoption of self-care skills and reduction of barriers for early signs and symptoms recognition and intervention leading to self-care maintenance.    Hypertension Yes    Intervention Provide education on lifestyle modifcations including regular physical  activity/exercise, weight management, moderate sodium restriction and increased consumption of fresh fruit, vegetables, and low fat dairy, alcohol moderation, and smoking cessation.;Monitor prescription use compliance.    Expected Outcomes Short Term: Continued assessment and intervention until BP is < 140/64m HG in hypertensive participants. < 130/812mHG in hypertensive participants with diabetes, heart failure or chronic kidney disease.;Long Term: Maintenance of blood pressure at goal levels.    Lipids Yes    Intervention Provide education and support for participant on nutrition & aerobic/resistive exercise along with prescribed medications to achieve LDL '70mg'$ , HDL >'40mg'$ .    Expected Outcomes Short Term: Participant states understanding of desired cholesterol values and is compliant with medications prescribed. Participant is following exercise prescription and nutrition guidelines.;Long Term: Cholesterol controlled with medications as prescribed, with individualized exercise RX and with personalized nutrition plan. Value goals: LDL < '70mg'$ , HDL > 40 mg.             Education:Diabetes - Individual verbal and written instruction to review signs/symptoms of diabetes, desired ranges of glucose level fasting, after meals and with exercise. Acknowledge that pre and post exercise glucose checks will be done for 3 sessions at entry of program.   Core Components/Risk Factors/Patient Goals Review:    Core Components/Risk Factors/Patient Goals at Discharge (Final Review):    ITP Comments:  ITP Comments     Row Name 06/15/22 1010 06/22/22 1242         ITP Comments Virtual Visit completed. Patient informed on EP and RD appointment and 6 Minute walk test. Patient also informed of patient health questionnaires on My Chart. Patient Verbalizes understanding. [facility] Completed 6MWT and gym orientation. Initial ITP created and sent for review to Dr. MaEmily FilbertMedical Director.               Comments: Initial ITP

## 2022-06-23 ENCOUNTER — Encounter: Payer: BC Managed Care – PPO | Admitting: *Deleted

## 2022-06-23 DIAGNOSIS — I5042 Chronic combined systolic (congestive) and diastolic (congestive) heart failure: Secondary | ICD-10-CM

## 2022-06-23 NOTE — Progress Notes (Signed)
Daily Session Note  Patient Details  Name: Mason Frank MRN: [id number] Date of Birth: 1961/04/10 Referring Provider:   Flowsheet Row Cardiac Rehab from 06/22/2022 in [facility]  Referring Provider Lujean Amel MD       Encounter Date: 06/23/2022  Check In:  Session Check In - 06/23/22 1010       Check-In   Supervising physician immediately available to respond to emergencies See telemetry face sheet for immediately available ER MD    Location [facility]-Cardiac & Pulmonary Rehab    Staff Present Alberteen Sam, MA, RCEP, CCRP, Marylynn Pearson, MS, ASCM CEP, Exercise Physiologist;Jewelle Whitner Sherryll Burger, RN BSN    Virtual Visit No    Medication changes reported     No    Fall or balance concerns reported    No    Warm-up and Cool-down Performed on first and last piece of equipment    Resistance Training Performed Yes    VAD Patient? No    PAD/SET Patient? No      Pain Assessment   Currently in Pain? No/denies                Social History   Tobacco Use  Smoking Status Former   Packs/day: 1.00   Years: 45.00   Total pack years: 45.00   Types: Cigarettes   Quit date: 07/24/2021   Years since quitting: 0.9  Smokeless Tobacco Never    Goals Met:  Independence with exercise equipment Exercise tolerated well No report of concerns or symptoms today Strength training completed today  Goals Unmet:  Not Applicable  Comments: First full day of exercise!  Patient was oriented to gym and equipment including functions, settings, policies, and procedures.  Patient's individual exercise prescription and treatment plan were reviewed.  All starting workloads were established based on the results of the 6 minute walk test done at initial orientation visit.  The plan for exercise progression was also introduced and progression will be customized based on patient's performance and goals.     Dr. Emily Filbert is Medical Director for [facility].  Dr. Ottie Glazier is Medical Director for [facility].

## 2022-06-24 ENCOUNTER — Encounter: Payer: Self-pay | Admitting: *Deleted

## 2022-06-24 DIAGNOSIS — I5042 Chronic combined systolic (congestive) and diastolic (congestive) heart failure: Secondary | ICD-10-CM

## 2022-06-24 NOTE — Progress Notes (Signed)
Cardiac Individual Treatment Plan  Patient Details  Name: Mason Frank MRN: [id number] Date of Birth: 1961/06/28 Referring Provider:   Flowsheet Row Cardiac Rehab from 06/22/2022 in [facility] Cardiac and Pulmonary Rehab  Referring Provider Lujean Amel MD       Initial Encounter Date:  Flowsheet Row Cardiac Rehab from 06/22/2022 in [facility] Cardiac and Pulmonary Rehab  Date 06/22/22       Visit Diagnosis: Heart failure, systolic and diastolic, chronic ([place])  Patient's Home Medications on Admission:  Current Outpatient Medications:    allopurinol (ZYLOPRIM) 100 MG tablet, Take 100 mg by mouth daily at 12 noon. (Patient not taking: Reported on 06/15/2022), Disp: , Rfl:    allopurinol (ZYLOPRIM) 100 MG tablet, Take 1 tablet by mouth daily., Disp: , Rfl:    apixaban (ELIQUIS) 5 MG TABS tablet, Take 5 mg by mouth 2 (two) times daily. (Patient not taking: Reported on 06/15/2022), Disp: , Rfl:    apixaban (ELIQUIS) 5 MG TABS tablet, Take by mouth., Disp: , Rfl:    aspirin EC 81 MG tablet, Take 81 mg by mouth daily at 12 noon. (Patient not taking: Reported on 06/15/2022), Disp: , Rfl:    aspirin EC 81 MG tablet, Take by mouth., Disp: , Rfl:    atorvastatin (LIPITOR) 20 MG tablet, Take by mouth. (Patient not taking: Reported on 06/15/2022), Disp: , Rfl:    clopidogrel (PLAVIX) 300 MG TABS tablet, Take by mouth., Disp: , Rfl:    colchicine 0.6 MG tablet, Take 0.6 mg by mouth daily as needed (gout flares). (Patient not taking: Reported on 07/02/2021), Disp: , Rfl:    colchicine 0.6 MG tablet, Take 2 tablets (1.'2mg'$ ) by mouth at first sign of gout flare followed by 1 tablet (0.'6mg'$ ) after 1 hour. (Max 1.'8mg'$  within 1 hour), Disp: , Rfl:    doxycycline (VIBRAMYCIN) 50 MG capsule, Take 1 capsule (50 mg total) by mouth daily. Take with food. (Patient not taking: Reported on 03/12/2022), Disp: 90 capsule, Rfl: 1   empagliflozin (JARDIANCE) 10 MG TABS tablet, Take 10 mg by mouth daily. (Patient not taking:  Reported on 06/15/2022), Disp: , Rfl:    empagliflozin (JARDIANCE) 10 MG TABS tablet, Take 1 tablet by mouth daily., Disp: , Rfl:    Fluticasone-Umeclidin-Vilant (TRELEGY ELLIPTA) 100-62.5-25 MCG/ACT AEPB, Inhale into the lungs daily. (Patient not taking: Reported on 06/15/2022), Disp: , Rfl:    Fluticasone-Umeclidin-Vilant (TRELEGY ELLIPTA) 100-62.5-25 MCG/ACT AEPB, Inhale into the lungs., Disp: , Rfl:    furosemide (LASIX) 20 MG tablet, Take 20 mg by mouth daily at 12 noon. (Patient not taking: Reported on 03/12/2022), Disp: , Rfl:    ketoconazole (NIZORAL) 2 % cream, For fungal infection apply to the feet QHS., Disp: 60 g, Rfl: 6   ketoconazole (NIZORAL) 2 % cream, Apply topically. (Patient not taking: Reported on 06/15/2022), Disp: , Rfl:    losartan (COZAAR) 100 MG tablet, Take 100 mg by mouth daily at 12 noon. (Patient not taking: Reported on 06/15/2022), Disp: , Rfl:    metoprolol succinate (TOPROL-XL) 25 MG 24 hr tablet, Take by mouth daily at 12 noon. (Patient not taking: Reported on 06/15/2022), Disp: , Rfl:    metoprolol succinate (TOPROL-XL) 25 MG 24 hr tablet, Take 1 tablet by mouth daily., Disp: , Rfl:    naproxen sodium (ALEVE) 220 MG tablet, Take 440 mg by mouth daily as needed (pain). (Patient not taking: Reported on 06/15/2022), Disp: , Rfl:    potassium chloride (KLOR-CON) 20 MEQ packet, Take 20 mEq  by mouth daily at 12 noon. (Patient not taking: Reported on 07/02/2021), Disp: , Rfl:    rosuvastatin (CRESTOR) 20 MG tablet, Take by mouth., Disp: , Rfl:    rosuvastatin (CRESTOR) 20 MG tablet, Take by mouth., Disp: , Rfl:    sacubitril-valsartan (ENTRESTO) 24-26 MG, Take 1 tablet by mouth 2 (two) times daily. (Patient not taking: Reported on 06/15/2022), Disp: , Rfl:    sacubitril-valsartan (ENTRESTO) 24-26 MG, Take 1 tablet by mouth 2 (two) times daily., Disp: , Rfl:    spironolactone (ALDACTONE) 25 MG tablet, Take 25 mg by mouth daily., Disp: , Rfl:    torsemide (DEMADEX) 20 MG  tablet, Take 20 mg by mouth daily. (Patient not taking: Reported on 06/15/2022), Disp: , Rfl:    TraZODone & Diet Manage Prod (TRAZAMINE PO), Take by mouth. (Patient not taking: Reported on 06/15/2022), Disp: , Rfl:    traZODone (DESYREL) 50 MG tablet, Take 50 mg by mouth at bedtime., Disp: , Rfl:   Past Medical History: Past Medical History:  Diagnosis Date   Cancer ([place]) 1990   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   Dysrhythmia    Pulmonary embolism ([place])    Sleep apnea     Tobacco Use: Social History   Tobacco Use  Smoking Status Former   Packs/day: 1.00   Years: 45.00   Total pack years: 45.00   Types: Cigarettes   Quit date: 07/24/2021   Years since quitting: 0.9  Smokeless Tobacco Never    Labs: Review Flowsheet       Latest Ref Rng & Units 10/17/2012  Labs for ITP Cardiac and Pulmonary Rehab  Cholestrol 0 - 200 mg/dL 239   LDL (calc) 0 - 100 mg/dL 164   HDL-C 40 - 60 mg/dL 59   Trlycerides 0 - 200 mg/dL 81      Exercise Target Goals: Exercise Program Goal: Individual exercise prescription set using results from initial 6 min walk test and THRR while considering  patient's activity barriers and safety.   Exercise Prescription Goal: Initial exercise prescription builds to 30-45 minutes a day of aerobic activity, 2-3 days per week.  Home exercise guidelines will be given to patient during program as part of exercise prescription that the participant will acknowledge.   Education: Aerobic Exercise: - Group verbal and visual presentation on the components of exercise prescription. Introduces F.I.T.T principle from ACSM for exercise prescriptions.  Reviews F.I.T.T. principles of aerobic exercise including progression. Written material given at graduation.   Education: Resistance Exercise: - Group verbal and visual presentation on the components of exercise prescription. Introduces F.I.T.T principle from ACSM for exercise prescriptions  Reviews F.I.T.T.  principles of resistance exercise including progression. Written material given at graduation.    Education: Exercise & Equipment Safety: - Individual verbal instruction and demonstration of equipment use and safety with use of the equipment. Flowsheet Row Cardiac Rehab from 06/22/2022 in [facility] Cardiac and Pulmonary Rehab  Date 06/15/22  Educator [facility]  Instruction Review Code 1- Verbalizes Understanding       Education: Exercise Physiology & General Exercise Guidelines: - Group verbal and written instruction with models to review the exercise physiology of the cardiovascular system and associated critical values. Provides general exercise guidelines with specific guidelines to those with heart or lung disease.    Education: Flexibility, Balance, Mind/Body Relaxation: - Group verbal and visual presentation with interactive activity on the components of exercise prescription. Introduces F.I.T.T principle from ACSM for exercise prescriptions. Reviews F.I.T.T. principles  of flexibility and balance exercise training including progression. Also discusses the mind body connection.  Reviews various relaxation techniques to help reduce and manage stress (i.e. Deep breathing, progressive muscle relaxation, and visualization). Balance handout provided to take home. Written material given at graduation.   Activity Barriers & Risk Stratification:  Activity Barriers & Cardiac Risk Stratification - 06/22/22 1246       Activity Barriers & Cardiac Risk Stratification   Activity Barriers Shortness of Breath;Decreased Ventricular Function;Balance Concerns;Muscular Weakness;Deconditioning    Cardiac Risk Stratification High             6 Minute Walk:  6 Minute Walk     Row Name 06/22/22 1244         6 Minute Walk   Phase Initial     Distance 1045 feet     Walk Time 6 minutes     # of Rest Breaks 0     MPH 1.98     METS 2.76     RPE 13     Perceived Dyspnea  2     VO2 Peak 9.67      Symptoms Yes (comment)     Comments SOB     Resting HR 87 bpm     Resting BP 126/64     Resting Oxygen Saturation  97 %     Exercise Oxygen Saturation  during 6 min walk 97 %     Max Ex. HR 132 bpm     Max Ex. BP 148/74     2 Minute Post BP 134/72              Oxygen Initial Assessment:   Oxygen Re-Evaluation:   Oxygen Discharge (Final Oxygen Re-Evaluation):   Initial Exercise Prescription:  Initial Exercise Prescription - 06/22/22 1200       Date of Initial Exercise RX and Referring Provider   Date 06/22/22    Referring Provider Lujean Amel MD      Oxygen   Maintain Oxygen Saturation 88% or higher      Treadmill   MPH 1.8    Grade 0.5    Minutes 15    METs 2.5      Recumbant Bike   Level 2    RPM 50    Watts 32    Minutes 15    METs 2.5      NuStep   Level 2    SPM 80    Minutes 15    METs 2.5      Track   Laps 26    Minutes 15    METs 2.41      Prescription Details   Frequency (times per week) 3    Duration Progress to 30 minutes of continuous aerobic without signs/symptoms of physical distress      Intensity   THRR 40-80% of Max Heartrate 115-144    Ratings of Perceived Exertion 11-13    Perceived Dyspnea 0-4      Progression   Progression Continue to progress workloads to maintain intensity without signs/symptoms of physical distress.      Resistance Training   Training Prescription Yes    Weight 4 lb    Reps 10-15             Perform Capillary Blood Glucose checks as needed.  Exercise Prescription Changes:   Exercise Prescription Changes     Row Name 06/22/22 1200             Response  to Exercise   Blood Pressure (Admit) 126/64       Blood Pressure (Exercise) 148/74       Blood Pressure (Exit) 134/72       Heart Rate (Admit) 81 bpm       Heart Rate (Exercise) 132 bpm       Heart Rate (Exit) 96 bpm       Oxygen Saturation (Admit) 97 %       Oxygen Saturation (Exercise) 97 %       Rating of Perceived  Exertion (Exercise) 13       Perceived Dyspnea (Exercise) 2       Symptoms SOB       Comments walk test results                Exercise Comments:   Exercise Goals and Review:   Exercise Goals     Row Name 06/22/22 1250             Exercise Goals   Increase Physical Activity Yes       Intervention Provide advice, education, support and counseling about physical activity/exercise needs.;Develop an individualized exercise prescription for aerobic and resistive training based on initial evaluation findings, risk stratification, comorbidities and participant's personal goals.       Expected Outcomes Short Term: Attend rehab on a regular basis to increase amount of physical activity.;Long Term: Add in home exercise to make exercise part of routine and to increase amount of physical activity.;Long Term: Exercising regularly at least 3-5 days a week.       Increase Strength and Stamina Yes       Intervention Provide advice, education, support and counseling about physical activity/exercise needs.;Develop an individualized exercise prescription for aerobic and resistive training based on initial evaluation findings, risk stratification, comorbidities and participant's personal goals.       Expected Outcomes Short Term: Increase workloads from initial exercise prescription for resistance, speed, and METs.;Short Term: Perform resistance training exercises routinely during rehab and add in resistance training at home;Long Term: Improve cardiorespiratory fitness, muscular endurance and strength as measured by increased METs and functional capacity (6MWT)       Able to understand and use rate of perceived exertion (RPE) scale Yes       Intervention Provide education and explanation on how to use RPE scale       Expected Outcomes Short Term: Able to use RPE daily in rehab to express subjective intensity level;Long Term:  Able to use RPE to guide intensity level when exercising independently        Able to understand and use Dyspnea scale Yes       Intervention Provide education and explanation on how to use Dyspnea scale       Expected Outcomes Short Term: Able to use Dyspnea scale daily in rehab to express subjective sense of shortness of breath during exertion;Long Term: Able to use Dyspnea scale to guide intensity level when exercising independently       Knowledge and understanding of Target Heart Rate Range (THRR) Yes       Intervention Provide education and explanation of THRR including how the numbers were predicted and where they are located for reference       Expected Outcomes Short Term: Able to state/look up THRR;Short Term: Able to use daily as guideline for intensity in rehab;Long Term: Able to use THRR to govern intensity when exercising independently       Able to check pulse  independently Yes       Intervention Provide education and demonstration on how to check pulse in carotid and radial arteries.;Review the importance of being able to check your own pulse for safety during independent exercise       Expected Outcomes Short Term: Able to explain why pulse checking is important during independent exercise;Long Term: Able to check pulse independently and accurately       Understanding of Exercise Prescription Yes       Intervention Provide education, explanation, and written materials on patient's individual exercise prescription       Expected Outcomes Short Term: Able to explain program exercise prescription;Long Term: Able to explain home exercise prescription to exercise independently                Exercise Goals Re-Evaluation :  Exercise Goals Re-Evaluation     Row Name 06/23/22 1012             Exercise Goal Re-Evaluation   Exercise Goals Review Increase Physical Activity;Able to understand and use rate of perceived exertion (RPE) scale;Knowledge and understanding of Target Heart Rate Range (THRR);Understanding of Exercise Prescription;Increase Strength and  Stamina;Able to check pulse independently       Comments Reviewed RPE scale, THR and program prescription with pt today.  Pt voiced understanding and was given a copy of goals to take home.       Expected Outcomes Short: Use RPE daily to regulate intensity.  Long: Follow program prescription in THR.                Discharge Exercise Prescription (Final Exercise Prescription Changes):  Exercise Prescription Changes - 06/22/22 1200       Response to Exercise   Blood Pressure (Admit) 126/64    Blood Pressure (Exercise) 148/74    Blood Pressure (Exit) 134/72    Heart Rate (Admit) 81 bpm    Heart Rate (Exercise) 132 bpm    Heart Rate (Exit) 96 bpm    Oxygen Saturation (Admit) 97 %    Oxygen Saturation (Exercise) 97 %    Rating of Perceived Exertion (Exercise) 13    Perceived Dyspnea (Exercise) 2    Symptoms SOB    Comments walk test results             Nutrition:  Target Goals: Understanding of nutrition guidelines, daily intake of sodium '1500mg'$ , cholesterol '200mg'$ , calories 30% from fat and 7% or less from saturated fats, daily to have 5 or more servings of fruits and vegetables.  Education: All About Nutrition: -Group instruction provided by verbal, written material, interactive activities, discussions, models, and posters to present general guidelines for heart healthy nutrition including fat, fiber, MyPlate, the role of sodium in heart healthy nutrition, utilization of the nutrition label, and utilization of this knowledge for meal planning. Follow up email sent as well. Written material given at graduation.   Biometrics:  Pre Biometrics - 06/22/22 1251       Pre Biometrics   Height 6' 3.6" (1.92 m)    Weight 304 lb 1.6 oz (137.9 kg)    Waist Circumference 45 inches    Hip Circumference 48 inches    Waist to Hip Ratio 0.94 %    BMI (Calculated) 37.42    Single Leg Stand 8.8 seconds              Nutrition Therapy Plan and Nutrition Goals:  Nutrition  Therapy & Goals - 06/22/22 6433  Nutrition Therapy   Diet Heart healthy, low Na    Drug/Food Interactions Statins/Certain Fruits    Protein (specify units) 100g    Fiber 33 grams    Whole Grain Foods 3 servings    Saturated Fats 18 max. grams    Fruits and Vegetables 8 servings/day    Sodium 1.5 grams      Personal Nutrition Goals   Nutrition Goal ST: review paperwork, consider preparing more meals at home. LT: limit eating out <2-3x/week, include 5-8 servings of fruit/vegetables, increase varitey, limit salt <2g/day    Comments 61 y.o. M admitted to cardiac rehab for heart failure. PMHx inlcudes bilateral pulmonary embolism, cancer, cardiomyopathy, COPD, CAD, HFrEF, HTN, HLD, MI, sleep apnea, gout. Relevant medications includes colchicine, jardiance, crestor, torsemide, trazodone. PYP Score: 70. Vegetables & Fruits 7/12. Breads, Grains & Cereals 11/12. Red & Processed Meat 10/12. Poultry 2/2. Fish & Shellfish 0/4. Beans, Nuts & Seeds 1/4. Milk & Dairy Foods 5/6. Toppings, Oils, Seasonings & Salt 14/20. Sweets, Snacks & Restaurant Food 11/14. Beverages 9/10.    B: cereal (2% milk and raisin bran or kashi) with OJ L: [place] food out to eat (chicken soup) or PB and J sandwich (whole grain natures own) D: sometimes will not eat (will have [place] food) Drinks: water. Mason Frank reports that he like salads as well sometimes during the week. Discussed general heart healthy eating and discussed considerations with eating out (whole grains are hard to find, limited variety, high fat red meat, usually vegetables are cooked with butter, high in sodium, etc). Suggested cooking more meals at home.      Intervention Plan   Intervention Prescribe, educate and counsel regarding individualized specific dietary modifications aiming towards targeted core components such as weight, hypertension, lipid management, diabetes, heart failure and other comorbidities.;Nutrition handout(s) given to patient.    Expected  Outcomes Short Term Goal: Understand basic principles of dietary content, such as calories, fat, sodium, cholesterol and nutrients.;Short Term Goal: A plan has been developed with personal nutrition goals set during dietitian appointment.;Long Term Goal: Adherence to prescribed nutrition plan.             Nutrition Assessments:  MEDIFICTS Score Key: ?70 Need to make dietary changes  40-70 Heart Healthy Diet ? 40 Therapeutic Level Cholesterol Diet  Flowsheet Row Cardiac Rehab from 06/22/2022 in [facility] Cardiac and Pulmonary Rehab  Picture Your Plate Total Score on Admission 70      Picture Your Plate Scores: <38 Unhealthy dietary pattern with much room for improvement. 41-50 Dietary pattern unlikely to meet recommendations for good health and room for improvement. 51-60 More healthful dietary pattern, with some room for improvement.  >60 Healthy dietary pattern, although there may be some specific behaviors that could be improved.    Nutrition Goals Re-Evaluation:   Nutrition Goals Discharge (Final Nutrition Goals Re-Evaluation):   Psychosocial: Target Goals: Acknowledge presence or absence of significant depression and/or stress, maximize coping skills, provide positive support system. Participant is able to verbalize types and ability to use techniques and skills needed for reducing stress and depression.   Education: Stress, Anxiety, and Depression - Group verbal and visual presentation to define topics covered.  Reviews how body is impacted by stress, anxiety, and depression.  Also discusses healthy ways to reduce stress and to treat/manage anxiety and depression.  Written material given at graduation.   Education: Sleep Hygiene -Provides group verbal and written instruction about how sleep can affect your health.  Define sleep hygiene, discuss sleep  cycles and impact of sleep habits. Review good sleep hygiene tips.    Initial Review & Psychosocial Screening:  Initial  Psych Review & Screening - 06/15/22 1012       Initial Review   Current issues with Current Stress Concerns    Source of Stress Concerns Occupation    Comments He has lost his job recently and is doing ok with being out of work. He is ready to get his health back in order so he can work a less demanding job. He does not take any medicaions for his mood.      Family Dynamics   Good Support System? Yes    Comments He can look to his wife for support, sisters and brother. His two daughters are nearby also.      Barriers   Psychosocial barriers to participate in program The patient should benefit from training in stress management and relaxation.;There are no identifiable barriers or psychosocial needs.      Screening Interventions   Interventions Encouraged to exercise;To provide support and resources with identified psychosocial needs;Provide feedback about the scores to participant    Expected Outcomes Short Term goal: Utilizing psychosocial counselor, staff and physician to assist with identification of specific Stressors or current issues interfering with healing process. Setting desired goal for each stressor or current issue identified.;Long Term Goal: Stressors or current issues are controlled or eliminated.;Short Term goal: Identification and review with participant of any Quality of Life or Depression concerns found by scoring the questionnaire.;Long Term goal: The participant improves quality of Life and PHQ9 Scores as seen by post scores and/or verbalization of changes             Quality of Life Scores:   Quality of Life - 06/22/22 1251       Quality of Life   Select Quality of Life      Quality of Life Scores   Health/Function Pre 20.07 %    Socioeconomic Pre 21.5 %    Psych/Spiritual Pre 22.86 %    Family Pre 24 %    GLOBAL Pre 21.51 %            Scores of 19 and below usually indicate a poorer quality of life in these areas.  A difference of  2-3 points is a  clinically meaningful difference.  A difference of 2-3 points in the total score of the Quality of Life Index has been associated with significant improvement in overall quality of life, self-image, physical symptoms, and general health in studies assessing change in quality of life.  PHQ-9: Review Flowsheet       06/22/2022 06/15/2019 04/26/2019  Depression screen PHQ 2/9  Decreased Interest 0 0 1  Down, Depressed, Hopeless 0 0 0  PHQ - 2 Score 0 0 1  Altered sleeping 1 0 0  Tired, decreased energy '1 1 1  '$ Change in appetite 1 1 0  Feeling bad or failure about yourself  0 0 0  Trouble concentrating 0 0 0  Moving slowly or fidgety/restless 0 0 0  Suicidal thoughts 0 0 0  PHQ-9 Score '3 2 2  '$ Difficult doing work/chores Not difficult at all Not difficult at all Not difficult at all   Interpretation of Total Score  Total Score Depression Severity:  1-4 = Minimal depression, 5-9 = Mild depression, 10-14 = Moderate depression, 15-19 = Moderately severe depression, 20-27 = Severe depression   Psychosocial Evaluation and Intervention:  Psychosocial Evaluation - 06/15/22 1015  Psychosocial Evaluation & Interventions   Interventions Encouraged to exercise with the program and follow exercise prescription;Relaxation education;Stress management education    Comments He has lost his job recently and is doing ok with being out of work. He is ready to get his health back in order so he can work a less demanding job. He does not take any medicaions for his mood.    Expected Outcomes Short: Start [facility] to help with mood. Long: Maintain a healthy mental state    Continue Psychosocial Services  Follow up required by staff             Psychosocial Re-Evaluation:   Psychosocial Discharge (Final Psychosocial Re-Evaluation):   Vocational Rehabilitation: Provide vocational rehab assistance to qualifying candidates.   Vocational Rehab Evaluation & Intervention:  Vocational Rehab -  06/22/22 1252       Initial Vocational Rehab Evaluation & Intervention   Assessment shows need for Vocational Rehabilitation No             Education: Education Goals: Education classes will be provided on a variety of topics geared toward better understanding of heart health and risk factor modification. Participant will state understanding/return demonstration of topics presented as noted by education test scores.  Learning Barriers/Preferences:  Learning Barriers/Preferences - 06/15/22 1012       Learning Barriers/Preferences   Learning Barriers None    Learning Preferences None             General Cardiac Education Topics:  AED/CPR: - Group verbal and written instruction with the use of models to demonstrate the basic use of the AED with the basic ABC's of resuscitation.   Anatomy and Cardiac Procedures: - Group verbal and visual presentation and models provide information about basic cardiac anatomy and function. Reviews the testing methods done to diagnose heart disease and the outcomes of the test results. Describes the treatment choices: Medical Management, Angioplasty, or Coronary Bypass Surgery for treating various heart conditions including Myocardial Infarction, Angina, Valve Disease, and Cardiac Arrhythmias.  Written material given at graduation. Flowsheet Row Cardiac Rehab from 06/22/2022 in [facility] Cardiac and Pulmonary Rehab  Education need identified 06/22/22       Medication Safety: - Group verbal and visual instruction to review commonly prescribed medications for heart and lung disease. Reviews the medication, class of the drug, and side effects. Includes the steps to properly store meds and maintain the prescription regimen.  Written material given at graduation.   Intimacy: - Group verbal instruction through game format to discuss how heart and lung disease can affect sexual intimacy. Written material given at graduation..   Know Your Numbers and  Heart Failure: - Group verbal and visual instruction to discuss disease risk factors for cardiac and pulmonary disease and treatment options.  Reviews associated critical values for Overweight/Obesity, Hypertension, Cholesterol, and Diabetes.  Discusses basics of heart failure: signs/symptoms and treatments.  Introduces Heart Failure Zone chart for action plan for heart failure.  Written material given at graduation.   Infection Prevention: - Provides verbal and written material to individual with discussion of infection control including proper hand washing and proper equipment cleaning during exercise session. Flowsheet Row Cardiac Rehab from 06/22/2022 in [facility] Cardiac and Pulmonary Rehab  Date 06/15/22  Educator [facility]  Instruction Review Code 1- Verbalizes Understanding       Falls Prevention: - Provides verbal and written material to individual with discussion of falls prevention and safety. Flowsheet Row Cardiac Rehab from 06/22/2022 in [facility] Cardiac and  Pulmonary Rehab  Date 06/15/22  Educator [facility]  Instruction Review Code 1- Verbalizes Understanding       Other: -Provides group and verbal instruction on various topics (see comments)   Knowledge Questionnaire Score:  Knowledge Questionnaire Score - 06/22/22 1252       Knowledge Questionnaire Score   Pre Score 24/26             Core Components/Risk Factors/Patient Goals at Admission:  Personal Goals and Risk Factors at Admission - 06/22/22 1253       Core Components/Risk Factors/Patient Goals on Admission    Weight Management Yes;Weight Loss;Obesity    Intervention Weight Management/Obesity: Establish reasonable short term and long term weight goals.;Weight Management: Develop a combined nutrition and exercise program designed to reach desired caloric intake, while maintaining appropriate intake of nutrient and fiber, sodium and fats, and appropriate energy expenditure required for the weight goal.;Weight Management:  Provide education and appropriate resources to help participant work on and attain dietary goals.;Obesity: Provide education and appropriate resources to help participant work on and attain dietary goals.    Admit Weight 304 lb 1.6 oz (137.9 kg)    Goal Weight: Short Term 299 lb (135.6 kg)    Goal Weight: Long Term 269 lb (122 kg)    Expected Outcomes Short Term: Continue to assess and modify interventions until short term weight is achieved;Long Term: Adherence to nutrition and physical activity/exercise program aimed toward attainment of established weight goal;Weight Loss: Understanding of general recommendations for a balanced deficit meal plan, which promotes 1-2 lb weight loss per week and includes a negative energy balance of [phone] kcal/d;Understanding recommendations for meals to include 15-35% energy as protein, 25-35% energy from fat, 35-60% energy from carbohydrates, less than '200mg'$  of dietary cholesterol, 20-35 gm of total fiber daily;Understanding of distribution of calorie intake throughout the day with the consumption of 4-5 meals/snacks    Heart Failure Yes    Intervention Provide a combined exercise and nutrition program that is supplemented with education, support and counseling about heart failure. Directed toward relieving symptoms such as shortness of breath, decreased exercise tolerance, and extremity edema.    Expected Outcomes Improve functional capacity of life;Short term: Attendance in program 2-3 days a week with increased exercise capacity. Reported lower sodium intake. Reported increased fruit and vegetable intake. Reports medication compliance.;Short term: Daily weights obtained and reported for increase. Utilizing diuretic protocols set by physician.;Long term: Adoption of self-care skills and reduction of barriers for early signs and symptoms recognition and intervention leading to self-care maintenance.    Hypertension Yes    Intervention Provide education on lifestyle  modifcations including regular physical activity/exercise, weight management, moderate sodium restriction and increased consumption of fresh fruit, vegetables, and low fat dairy, alcohol moderation, and smoking cessation.;Monitor prescription use compliance.    Expected Outcomes Short Term: Continued assessment and intervention until BP is < 140/54m HG in hypertensive participants. < 130/896mHG in hypertensive participants with diabetes, heart failure or chronic kidney disease.;Long Term: Maintenance of blood pressure at goal levels.    Lipids Yes    Intervention Provide education and support for participant on nutrition & aerobic/resistive exercise along with prescribed medications to achieve LDL '70mg'$ , HDL >'40mg'$ .    Expected Outcomes Short Term: Participant states understanding of desired cholesterol values and is compliant with medications prescribed. Participant is following exercise prescription and nutrition guidelines.;Long Term: Cholesterol controlled with medications as prescribed, with individualized exercise RX and with personalized nutrition plan. Value goals: LDL < '70mg'$ ,  HDL > 40 mg.             Education:Diabetes - Individual verbal and written instruction to review signs/symptoms of diabetes, desired ranges of glucose level fasting, after meals and with exercise. Acknowledge that pre and post exercise glucose checks will be done for 3 sessions at entry of program.   Core Components/Risk Factors/Patient Goals Review:    Core Components/Risk Factors/Patient Goals at Discharge (Final Review):    ITP Comments:  ITP Comments     Row Name 06/15/22 1010 06/22/22 1242 06/23/22 1012 06/24/22 1031     ITP Comments Virtual Visit completed. Patient informed on EP and RD appointment and 6 Minute walk test. Patient also informed of patient health questionnaires on My Chart. Patient Verbalizes understanding. Visit diagnosis can be found in [facility] 04/08/2022. Completed 6MWT and gym  orientation. Initial ITP created and sent for review to Dr. Emily Filbert, Medical Director. First full day of exercise!  Patient was oriented to gym and equipment including functions, settings, policies, and procedures.  Patient's individual exercise prescription and treatment plan were reviewed.  All starting workloads were established based on the results of the 6 minute walk test done at initial orientation visit.  The plan for exercise progression was also introduced and progression will be customized based on patient's performance and goals. 30 Day review completed. Medical Director ITP review done, changes made as directed, and signed approval by Medical Director.   NEW TO PROGRAM             Comments:

## 2022-06-25 ENCOUNTER — Encounter: Payer: BC Managed Care – PPO | Attending: Internal Medicine | Admitting: *Deleted

## 2022-06-25 DIAGNOSIS — Z955 Presence of coronary angioplasty implant and graft: Secondary | ICD-10-CM | POA: Diagnosis not present

## 2022-06-25 DIAGNOSIS — I5042 Chronic combined systolic (congestive) and diastolic (congestive) heart failure: Secondary | ICD-10-CM | POA: Diagnosis present

## 2022-06-25 NOTE — Progress Notes (Signed)
Daily Session Note  Patient Details  Name: Mason Frank MRN: [id number] Date of Birth: [birth year] Referring Provider:   Flowsheet Row Cardiac Rehab from 06/22/2022 in [facility]  Referring Provider Lujean Amel MD       Encounter Date: 06/25/2022  Check In:  Session Check In - 06/25/22 0914       Check-In   Supervising physician immediately available to respond to emergencies See telemetry face sheet for immediately available ER MD    Location [facility]-Cardiac & Pulmonary Rehab    Staff Present Coralie Keens, MS, ASCM CEP, Exercise Physiologist;Joseph Rosebud Poles, RN, [place]    Virtual Visit No    Medication changes reported     No    Fall or balance concerns reported    No    Warm-up and Cool-down Performed on first and last piece of equipment    Resistance Training Performed Yes    VAD Patient? No    PAD/SET Patient? No      Pain Assessment   Currently in Pain? No/denies                Social History   Tobacco Use  Smoking Status Former   Packs/day: 1.00   Years: 45.00   Total pack years: 45.00   Types: Cigarettes   Quit date: 07/24/2021   Years since quitting: 0.9  Smokeless Tobacco Never    Goals Met:  Independence with exercise equipment Exercise tolerated well No report of concerns or symptoms today Strength training completed today  Goals Unmet:  Not Applicable  Comments: Pt able to follow exercise prescription today without complaint.  Will continue to monitor for progression.    Dr. Emily Filbert is Medical Director for [place].  Dr. Ottie Glazier is Medical Director for [facility] Pulmonary Rehabilitation.

## 2022-06-30 ENCOUNTER — Encounter: Payer: BC Managed Care – PPO | Admitting: *Deleted

## 2022-06-30 DIAGNOSIS — I5042 Chronic combined systolic (congestive) and diastolic (congestive) heart failure: Secondary | ICD-10-CM

## 2022-06-30 NOTE — Progress Notes (Signed)
Daily Session Note  Patient Details  Name: Mason Frank MRN: [id number] Date of Birth: 05-22-1961 Referring Provider:   Flowsheet Row Cardiac Rehab from 06/22/2022 in [facility] Cardiac and Pulmonary Rehab  Referring Provider Lujean Amel MD       Encounter Date: 06/30/2022  Check In:  Session Check In - 06/30/22 1009       Check-In   Supervising physician immediately available to respond to emergencies See telemetry face sheet for immediately available ER MD    Location [facility]-Cardiac & Pulmonary Rehab    Staff Present Heath Lark, RN, BSN, CCRP;Jessica McNab, MA, RCEP, CCRP, Marylynn Pearson, MS, ASCM CEP, Exercise Physiologist    Virtual Visit No    Medication changes reported     No    Fall or balance concerns reported    No    Warm-up and Cool-down Performed on first and last piece of equipment    Resistance Training Performed Yes    VAD Patient? No    PAD/SET Patient? No      Pain Assessment   Currently in Pain? No/denies                Social History   Tobacco Use  Smoking Status Former   Packs/day: 1.00   Years: 45.00   Total pack years: 45.00   Types: Cigarettes   Quit date: 07/24/2021   Years since quitting: 0.9  Smokeless Tobacco Never    Goals Met:  Independence with exercise equipment Exercise tolerated well No report of concerns or symptoms today  Goals Unmet:  Not Applicable  Comments: Pt able to follow exercise prescription today without complaint.  Will continue to monitor for progression.    Dr. Emily Filbert is Medical Director for [place].  Dr. Ottie Glazier is Medical Director for [facility] Pulmonary Rehabilitation.

## 2022-07-02 ENCOUNTER — Encounter: Payer: BC Managed Care – PPO | Admitting: *Deleted

## 2022-07-02 DIAGNOSIS — I5042 Chronic combined systolic (congestive) and diastolic (congestive) heart failure: Secondary | ICD-10-CM | POA: Diagnosis not present

## 2022-07-02 NOTE — Progress Notes (Signed)
Daily Session Note  Patient Details  Name: SEGUNDO MAKELA MRN: [id number] Date of Birth: 1961/04/13 Referring Provider:   Flowsheet Row Cardiac Rehab from 06/22/2022 in [facility] Cardiac and Pulmonary Rehab  Referring Provider Lujean Amel MD       Encounter Date: 07/02/2022  Check In:  Session Check In - 07/02/22 1449       Check-In   Supervising physician immediately available to respond to emergencies See telemetry face sheet for immediately available ER MD    Location [facility]-Cardiac & Pulmonary Rehab    Staff Present Heath Lark, RN, BSN, CCRP;Jessica [place], MA, RCEP, CCRP, CCET;Joseph [place], [place]    Virtual Visit No    Medication changes reported     Yes    Comments added amidarone    Fall or balance concerns reported    No    Warm-up and Cool-down Performed on first and last piece of equipment    Resistance Training Performed Yes    VAD Patient? No    PAD/SET Patient? No      Pain Assessment   Currently in Pain? No/denies                Social History   Tobacco Use  Smoking Status Former   Packs/day: 1.00   Years: 45.00   Total pack years: 45.00   Types: Cigarettes   Quit date: 07/24/2021   Years since quitting: 0.9  Smokeless Tobacco Never    Goals Met:  Independence with exercise equipment Exercise tolerated well No report of concerns or symptoms today  Goals Unmet:  Not Applicable  Comments: Pt able to follow exercise prescription today without complaint.  Will continue to monitor for progression.    Dr. Emily Filbert is Medical Director for [place].  Dr. Ottie Glazier is Medical Director for [facility] Pulmonary Rehabilitation.

## 2022-07-07 ENCOUNTER — Encounter: Payer: BC Managed Care – PPO | Admitting: *Deleted

## 2022-07-07 DIAGNOSIS — I5042 Chronic combined systolic (congestive) and diastolic (congestive) heart failure: Secondary | ICD-10-CM | POA: Diagnosis not present

## 2022-07-07 NOTE — Progress Notes (Signed)
Daily Session Note  Patient Details  Name: Mason Frank MRN: [id number] Date of Birth: 28-Nov-1960 Referring Provider:   Flowsheet Row Cardiac Rehab from 06/22/2022 in [facility] Cardiac and Pulmonary Rehab  Referring Provider Lujean Amel MD       Encounter Date: 07/07/2022  Check In:  Session Check In - 07/07/22 1005       Check-In   Supervising physician immediately available to respond to emergencies See telemetry face sheet for immediately available ER MD    Location [facility]-Cardiac & Pulmonary Rehab    Staff Present Heath Lark, RN, BSN, CCRP;Jessica [place], MA, RCEP, CCRP, Marylynn Pearson, MS, ASCM CEP, Exercise Physiologist    Virtual Visit No    Medication changes reported     No    Fall or balance concerns reported    No    Warm-up and Cool-down Performed on first and last piece of equipment    Resistance Training Performed Yes    VAD Patient? No    PAD/SET Patient? No      Pain Assessment   Currently in Pain? No/denies                Social History   Tobacco Use  Smoking Status Former   Packs/day: 1.00   Years: 45.00   Total pack years: 45.00   Types: Cigarettes   Quit date: 07/24/2021   Years since quitting: 0.9  Smokeless Tobacco Never    Goals Met:  Independence with exercise equipment Exercise tolerated well No report of concerns or symptoms today  Goals Unmet:  Not Applicable  Comments: Pt able to follow exercise prescription today without complaint.  Will continue to monitor for progression.    Dr. Emily Filbert is Medical Director for [place].  Dr. Ottie Glazier is Medical Director for [facility] Pulmonary Rehabilitation.

## 2022-07-09 ENCOUNTER — Encounter: Payer: BC Managed Care – PPO | Admitting: *Deleted

## 2022-07-09 DIAGNOSIS — I5042 Chronic combined systolic (congestive) and diastolic (congestive) heart failure: Secondary | ICD-10-CM

## 2022-07-09 NOTE — Progress Notes (Signed)
Daily Session Note  Patient Details  Name: Mason Frank MRN: [id number] Date of Birth: 08-26-1960 Referring Provider:   Flowsheet Row Cardiac Rehab from 06/22/2022 in [facility] Cardiac and Pulmonary Rehab  Referring Provider Lujean Amel MD       Encounter Date: 07/09/2022  Check In:  Session Check In - 07/09/22 1038       Check-In   Supervising physician immediately available to respond to emergencies See telemetry face sheet for immediately available ER MD    Location [facility]-Cardiac & Pulmonary Rehab    Staff Present Alberteen Sam, MA, RCEP, CCRP, CCET;Joseph [place], Ernestina Patches, RN, [place]    Virtual Visit No    Medication changes reported     No    Fall or balance concerns reported    No    Warm-up and Cool-down Performed on first and last piece of equipment    Resistance Training Performed Yes    VAD Patient? No    PAD/SET Patient? No      Pain Assessment   Currently in Pain? No/denies                Social History   Tobacco Use  Smoking Status Former   Packs/day: 1.00   Years: 45.00   Total pack years: 45.00   Types: Cigarettes   Quit date: 07/24/2021   Years since quitting: 0.9  Smokeless Tobacco Never    Goals Met:  Independence with exercise equipment Exercise tolerated well No report of concerns or symptoms today Strength training completed today  Goals Unmet:  Not Applicable  Comments: Pt able to follow exercise prescription today without complaint.  Will continue to monitor for progression.  Pt's weight up 12 lbs since 2 days prior. Pt denies swelling or shortness of breath. No visible swelling noted. Pt reports taking Torsemide. Pt states scales need batteries at home and has not been weighting daily. Education provided for daily wts and pt states he will put new batteries in scales at home. Advised pt contact his cardiologist to notify of weight changes. Pt voiced understanding.    Dr. Emily Filbert is Medical Director for  [place].  Dr. Ottie Glazier is Medical Director for [facility] Pulmonary Rehabilitation.

## 2022-07-14 ENCOUNTER — Encounter: Payer: BC Managed Care – PPO | Admitting: *Deleted

## 2022-07-14 DIAGNOSIS — I5042 Chronic combined systolic (congestive) and diastolic (congestive) heart failure: Secondary | ICD-10-CM | POA: Diagnosis not present

## 2022-07-14 NOTE — Progress Notes (Signed)
Daily Session Note  Patient Details  Name: RANVIR RENOVATO MRN: [id number] Date of Birth: 07-05-1961 Referring Provider:   Flowsheet Row Cardiac Rehab from 06/22/2022 in [facility] Cardiac and Pulmonary Rehab  Referring Provider Lujean Amel MD       Encounter Date: 07/14/2022  Check In:  Session Check In - 07/14/22 1010       Check-In   Supervising physician immediately available to respond to emergencies See telemetry face sheet for immediately available ER MD    Location [facility]-Cardiac & Pulmonary Rehab    Staff Present Heath Lark, RN, BSN, CCRP;Jessica [place], MA, RCEP, CCRP, Marylynn Pearson, MS, ASCM CEP, Exercise Physiologist;Noah Tickle, BS, Exercise Physiologist    Virtual Visit No    Medication changes reported     No    Fall or balance concerns reported    No    Warm-up and Cool-down Performed on first and last piece of equipment    Resistance Training Performed Yes    VAD Patient? No    PAD/SET Patient? No      Pain Assessment   Currently in Pain? No/denies                Social History   Tobacco Use  Smoking Status Former   Packs/day: 1.00   Years: 45.00   Total pack years: 45.00   Types: Cigarettes   Quit date: 07/24/2021   Years since quitting: 0.9  Smokeless Tobacco Never    Goals Met:  Independence with exercise equipment Exercise tolerated well No report of concerns or symptoms today  Goals Unmet:  Not Applicable  Comments: Pt able to follow exercise prescription today without complaint.  Will continue to monitor for progression.    Dr. Emily Filbert is Medical Director for [place].  Dr. Ottie Glazier is Medical Director for [facility] Pulmonary Rehabilitation.

## 2022-07-21 ENCOUNTER — Encounter: Payer: BC Managed Care – PPO | Admitting: *Deleted

## 2022-07-21 DIAGNOSIS — I5042 Chronic combined systolic (congestive) and diastolic (congestive) heart failure: Secondary | ICD-10-CM

## 2022-07-21 NOTE — Progress Notes (Signed)
Daily Session Note  Patient Details  Name: Mason Frank MRN: [id number] Date of Birth: August 08, 1961 Referring Provider:   Flowsheet Row Cardiac Rehab from 06/22/2022 in [facility] Cardiac and Pulmonary Rehab  Referring Provider Lujean Amel MD       Encounter Date: 07/21/2022  Check In:  Session Check In - 07/21/22 1003       Check-In   Supervising physician immediately available to respond to emergencies See telemetry face sheet for immediately available ER MD    Location [facility]-Cardiac & Pulmonary Rehab    Staff Present Alberteen Sam, MA, RCEP, CCRP, Marylynn Pearson, MS, ASCM CEP, Exercise Physiologist;Yeng Frankie Sherryll Burger, RN BSN    Virtual Visit No    Medication changes reported     No    Fall or balance concerns reported    No    Warm-up and Cool-down Performed on first and last piece of equipment    Resistance Training Performed Yes    VAD Patient? No    PAD/SET Patient? No      Pain Assessment   Currently in Pain? No/denies                Social History   Tobacco Use  Smoking Status Former   Packs/day: 1.00   Years: 45.00   Total pack years: 45.00   Types: Cigarettes   Quit date: 07/24/2021   Years since quitting: 0.9  Smokeless Tobacco Never    Goals Met:  Independence with exercise equipment Exercise tolerated well No report of concerns or symptoms today Strength training completed today  Goals Unmet:  Not Applicable  Comments: Pt able to follow exercise prescription today without complaint.  Will continue to monitor for progression.    Dr. Emily Filbert is Medical Director for [place].  Dr. Ottie Glazier is Medical Director for [facility] Pulmonary Rehabilitation.

## 2022-07-22 ENCOUNTER — Encounter: Payer: Self-pay | Admitting: *Deleted

## 2022-07-22 DIAGNOSIS — I5042 Chronic combined systolic (congestive) and diastolic (congestive) heart failure: Secondary | ICD-10-CM

## 2022-07-22 NOTE — Progress Notes (Signed)
Cardiac Individual Treatment Plan  Patient Details  Name: Mason Frank MRN: [id number] Date of Birth: 1960-11-14 Referring Provider:   Flowsheet Row Cardiac Rehab from 06/22/2022 in [facility] Cardiac and Pulmonary Rehab  Referring Provider Lujean Amel MD       Initial Encounter Date:  Flowsheet Row Cardiac Rehab from 06/22/2022 in [facility] Cardiac and Pulmonary Rehab  Date 06/22/22       Visit Diagnosis: Heart failure, systolic and diastolic, chronic ([place])  Patient's Home Medications on Admission:  Current Outpatient Medications:    allopurinol (ZYLOPRIM) 100 MG tablet, Take 100 mg by mouth daily at 12 noon. (Patient not taking: Reported on 06/15/2022), Disp: , Rfl:    allopurinol (ZYLOPRIM) 100 MG tablet, Take 1 tablet by mouth daily., Disp: , Rfl:    amiodarone (PACERONE) 200 MG tablet, Take by mouth., Disp: , Rfl:    apixaban (ELIQUIS) 5 MG TABS tablet, Take 5 mg by mouth 2 (two) times daily. (Patient not taking: Reported on 06/15/2022), Disp: , Rfl:    apixaban (ELIQUIS) 5 MG TABS tablet, Take by mouth., Disp: , Rfl:    aspirin EC 81 MG tablet, Take 81 mg by mouth daily at 12 noon. (Patient not taking: Reported on 06/15/2022), Disp: , Rfl:    aspirin EC 81 MG tablet, Take by mouth., Disp: , Rfl:    atorvastatin (LIPITOR) 20 MG tablet, Take by mouth. (Patient not taking: Reported on 06/15/2022), Disp: , Rfl:    clopidogrel (PLAVIX) 300 MG TABS tablet, Take by mouth., Disp: , Rfl:    colchicine 0.6 MG tablet, Take 0.6 mg by mouth daily as needed (gout flares). (Patient not taking: Reported on 07/02/2021), Disp: , Rfl:    colchicine 0.6 MG tablet, Take 2 tablets (1.220m) by mouth at first sign of gout flare followed by 1 tablet (0.651m after 1 hour. (Max 1.20m66mithin 1 hour), Disp: , Rfl:    doxycycline (VIBRAMYCIN) 50 MG capsule, Take 1 capsule (50 mg total) by mouth daily. Take with food. (Patient not taking: Reported on 03/12/2022), Disp: 90 capsule, Rfl: 1   empagliflozin  (JARDIANCE) 10 MG TABS tablet, Take 10 mg by mouth daily. (Patient not taking: Reported on 06/15/2022), Disp: , Rfl:    empagliflozin (JARDIANCE) 10 MG TABS tablet, Take 1 tablet by mouth daily., Disp: , Rfl:    Fluticasone-Umeclidin-Vilant (TRELEGY ELLIPTA) 100-62.5-25 MCG/ACT AEPB, Inhale into the lungs daily. (Patient not taking: Reported on 06/15/2022), Disp: , Rfl:    Fluticasone-Umeclidin-Vilant (TRELEGY ELLIPTA) 100-62.5-25 MCG/ACT AEPB, Inhale into the lungs., Disp: , Rfl:    furosemide (LASIX) 20 MG tablet, Take 20 mg by mouth daily at 12 noon. (Patient not taking: Reported on 03/12/2022), Disp: , Rfl:    ketoconazole (NIZORAL) 2 % cream, For fungal infection apply to the feet QHS., Disp: 60 g, Rfl: 6   ketoconazole (NIZORAL) 2 % cream, Apply topically. (Patient not taking: Reported on 06/15/2022), Disp: , Rfl:    losartan (COZAAR) 100 MG tablet, Take 100 mg by mouth daily at 12 noon. (Patient not taking: Reported on 06/15/2022), Disp: , Rfl:    metoprolol succinate (TOPROL-XL) 25 MG 24 hr tablet, Take by mouth daily at 12 noon. (Patient not taking: Reported on 06/15/2022), Disp: , Rfl:    metoprolol succinate (TOPROL-XL) 25 MG 24 hr tablet, Take 1 tablet by mouth daily., Disp: , Rfl:    naproxen sodium (ALEVE) 220 MG tablet, Take 440 mg by mouth daily as needed (pain). (Patient not taking: Reported on 06/15/2022), Disp: ,  Rfl:    potassium chloride (KLOR-CON) 20 MEQ packet, Take 20 mEq by mouth daily at 12 noon. (Patient not taking: Reported on 07/02/2021), Disp: , Rfl:    rosuvastatin (CRESTOR) 20 MG tablet, Take by mouth., Disp: , Rfl:    rosuvastatin (CRESTOR) 20 MG tablet, Take by mouth., Disp: , Rfl:    sacubitril-valsartan (ENTRESTO) 24-26 MG, Take 1 tablet by mouth 2 (two) times daily. (Patient not taking: Reported on 06/15/2022), Disp: , Rfl:    sacubitril-valsartan (ENTRESTO) 24-26 MG, Take 1 tablet by mouth 2 (two) times daily., Disp: , Rfl:    spironolactone (ALDACTONE) 25 MG  tablet, Take 25 mg by mouth daily., Disp: , Rfl:    torsemide (DEMADEX) 20 MG tablet, Take 20 mg by mouth daily. (Patient not taking: Reported on 06/15/2022), Disp: , Rfl:    TraZODone & Diet Manage Prod (TRAZAMINE PO), Take by mouth. (Patient not taking: Reported on 06/15/2022), Disp: , Rfl:    traZODone (DESYREL) 50 MG tablet, Take 50 mg by mouth at bedtime., Disp: , Rfl:   Past Medical History: Past Medical History:  Diagnosis Date   Cancer ([place]) 1990   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   Dysrhythmia    Pulmonary embolism ([place])    Sleep apnea     Tobacco Use: Social History   Tobacco Use  Smoking Status Former   Packs/day: 1.00   Years: 45.00   Total pack years: 45.00   Types: Cigarettes   Quit date: 07/24/2021   Years since quitting: 0.9  Smokeless Tobacco Never    Labs: Review Flowsheet       Latest Ref Rng & Units 10/17/2012  Labs for ITP Cardiac and Pulmonary Rehab  Cholestrol 0 - 200 mg/dL 239   LDL (calc) 0 - 100 mg/dL 164   HDL-C 40 - 60 mg/dL 59   Trlycerides 0 - 200 mg/dL 81      Exercise Target Goals: Exercise Program Goal: Individual exercise prescription set using results from initial 6 min walk test and THRR while considering  patient's activity barriers and safety.   Exercise Prescription Goal: Initial exercise prescription builds to 30-45 minutes a day of aerobic activity, 2-3 days per week.  Home exercise guidelines will be given to patient during program as part of exercise prescription that the participant will acknowledge.   Education: Aerobic Exercise: - Group verbal and visual presentation on the components of exercise prescription. Introduces F.I.T.T principle from ACSM for exercise prescriptions.  Reviews F.I.T.T. principles of aerobic exercise including progression. Written material given at graduation.   Education: Resistance Exercise: - Group verbal and visual presentation on the components of exercise prescription. Introduces  F.I.T.T principle from ACSM for exercise prescriptions  Reviews F.I.T.T. principles of resistance exercise including progression. Written material given at graduation. Flowsheet Row Cardiac Rehab from 07/14/2022 in [facility] Cardiac and Pulmonary Rehab  Date 07/14/22  Educator NT  Instruction Review Code 1- Verbalizes Understanding        Education: Exercise & Equipment Safety: - Individual verbal instruction and demonstration of equipment use and safety with use of the equipment. Flowsheet Row Cardiac Rehab from 07/14/2022 in [facility] Cardiac and Pulmonary Rehab  Date 06/15/22  Educator [facility]  Instruction Review Code 1- Verbalizes Understanding       Education: Exercise Physiology & General Exercise Guidelines: - Group verbal and written instruction with models to review the exercise physiology of the cardiovascular system and associated critical values. Provides general exercise guidelines with specific  guidelines to those with heart or lung disease.  Flowsheet Row Cardiac Rehab from 07/14/2022 in [facility] Cardiac and Pulmonary Rehab  Date 06/25/22  Educator [facility]  Instruction Review Code 1- Verbalizes Understanding       Education: Flexibility, Balance, Mind/Body Relaxation: - Group verbal and visual presentation with interactive activity on the components of exercise prescription. Introduces F.I.T.T principle from ACSM for exercise prescriptions. Reviews F.I.T.T. principles of flexibility and balance exercise training including progression. Also discusses the mind body connection.  Reviews various relaxation techniques to help reduce and manage stress (i.e. Deep breathing, progressive muscle relaxation, and visualization). Balance handout provided to take home. Written material given at graduation. Flowsheet Row Cardiac Rehab from 07/14/2022 in [facility] Cardiac and Pulmonary Rehab  Date 07/14/22  Educator NT  Instruction Review Code 1- Verbalizes Understanding       Activity Barriers & Risk  Stratification:  Activity Barriers & Cardiac Risk Stratification - 06/22/22 1246       Activity Barriers & Cardiac Risk Stratification   Activity Barriers Shortness of Breath;Decreased Ventricular Function;Balance Concerns;Muscular Weakness;Deconditioning    Cardiac Risk Stratification High             6 Minute Walk:  6 Minute Walk     Row Name 06/22/22 1244         6 Minute Walk   Phase Initial     Distance 1045 feet     Walk Time 6 minutes     # of Rest Breaks 0     MPH 1.98     METS 2.76     RPE 13     Perceived Dyspnea  2     VO2 Peak 9.67     Symptoms Yes (comment)     Comments SOB     Resting HR 87 bpm     Resting BP 126/64     Resting Oxygen Saturation  97 %     Exercise Oxygen Saturation  during 6 min walk 97 %     Max Ex. HR 132 bpm     Max Ex. BP 148/74     2 Minute Post BP 134/72              Oxygen Initial Assessment:   Oxygen Re-Evaluation:   Oxygen Discharge (Final Oxygen Re-Evaluation):   Initial Exercise Prescription:  Initial Exercise Prescription - 06/22/22 1200       Date of Initial Exercise RX and Referring Provider   Date 06/22/22    Referring Provider Lujean Amel MD      Oxygen   Maintain Oxygen Saturation 88% or higher      Treadmill   MPH 1.8    Grade 0.5    Minutes 15    METs 2.5      Recumbant Bike   Level 2    RPM 50    Watts 32    Minutes 15    METs 2.5      NuStep   Level 2    SPM 80    Minutes 15    METs 2.5      Track   Laps 26    Minutes 15    METs 2.41      Prescription Details   Frequency (times per week) 3    Duration Progress to 30 minutes of continuous aerobic without signs/symptoms of physical distress      Intensity   THRR 40-80% of Max Heartrate 115-144    Ratings of Perceived Exertion  11-13    Perceived Dyspnea 0-4      Progression   Progression Continue to progress workloads to maintain intensity without signs/symptoms of physical distress.      Resistance Training    Training Prescription Yes    Weight 4 lb    Reps 10-15             Perform Capillary Blood Glucose checks as needed.  Exercise Prescription Changes:   Exercise Prescription Changes     Row Name 06/22/22 1200 06/30/22 1400 07/13/22 1500         Response to Exercise   Blood Pressure (Admit) 126/64 90/56 124/64     Blood Pressure (Exercise) 148/74 124/76 162/60     Blood Pressure (Exit) 134/72 108/66 102/60     Heart Rate (Admit) 81 bpm 72 bpm 72 bpm     Heart Rate (Exercise) 132 bpm 148 bpm 122 bpm     Heart Rate (Exit) 96 bpm 125 bpm 98 bpm     Oxygen Saturation (Admit) 97 % -- --     Oxygen Saturation (Exercise) 97 % -- --     Rating of Perceived Exertion (Exercise) _0 Perceived Dyspnea (Exercise) 2 0 --     Symptoms SOB None none     Comments walk test results First 2 days of Rehab --     Duration -- Continue with 30 min of aerobic exercise without signs/symptoms of physical distress. Continue with 30 min of aerobic exercise without signs/symptoms of physical distress.     Intensity -- THRR unchanged THRR unchanged       Progression   Progression -- Continue to progress workloads to maintain intensity without signs/symptoms of physical distress. Continue to progress workloads to maintain intensity without signs/symptoms of physical distress.     Average METs -- 2.15 2.14       Resistance Training   Training Prescription -- Yes Yes     Weight -- 4 lb 4 lb     Reps -- 10-15 10-15       Interval Training   Interval Training -- No Yes     Equipment -- -- Recumbant Bike     Comments -- -- level 2-8; light rpm       Treadmill   MPH -- 1.8 1.1     Grade -- 0.5 1     Minutes -- 15 15     METs -- 2.5 2       Recumbant Bike   Level -- 2.1 2  intervals up to 8     Minutes -- 15 15     METs -- 1.8 2       NuStep   Level -- 2 4     Minutes -- 15 15     METs -- 2.2 2.2       Oxygen   Maintain Oxygen Saturation -- 88% or higher 88% or higher               Exercise Comments:   Exercise Goals and Review:   Exercise Goals     Row Name 06/22/22 1250             Exercise Goals   Increase Physical Activity Yes       Intervention Provide advice, education, support and counseling about physical activity/exercise needs.;Develop an individualized exercise prescription for aerobic and resistive training based on initial evaluation findings, risk stratification, comorbidities and participant's personal goals.  Expected Outcomes Short Term: Attend rehab on a regular basis to increase amount of physical activity.;Long Term: Add in home exercise to make exercise part of routine and to increase amount of physical activity.;Long Term: Exercising regularly at least 3-5 days a week.       Increase Strength and Stamina Yes       Intervention Provide advice, education, support and counseling about physical activity/exercise needs.;Develop an individualized exercise prescription for aerobic and resistive training based on initial evaluation findings, risk stratification, comorbidities and participant's personal goals.       Expected Outcomes Short Term: Increase workloads from initial exercise prescription for resistance, speed, and METs.;Short Term: Perform resistance training exercises routinely during rehab and add in resistance training at home;Long Term: Improve cardiorespiratory fitness, muscular endurance and strength as measured by increased METs and functional capacity (6MWT)       Able to understand and use rate of perceived exertion (RPE) scale Yes       Intervention Provide education and explanation on how to use RPE scale       Expected Outcomes Short Term: Able to use RPE daily in rehab to express subjective intensity level;Long Term:  Able to use RPE to guide intensity level when exercising independently       Able to understand and use Dyspnea scale Yes       Intervention Provide education and explanation on how to use Dyspnea scale        Expected Outcomes Short Term: Able to use Dyspnea scale daily in rehab to express subjective sense of shortness of breath during exertion;Long Term: Able to use Dyspnea scale to guide intensity level when exercising independently       Knowledge and understanding of Target Heart Rate Range (THRR) Yes       Intervention Provide education and explanation of THRR including how the numbers were predicted and where they are located for reference       Expected Outcomes Short Term: Able to state/look up THRR;Short Term: Able to use daily as guideline for intensity in rehab;Long Term: Able to use THRR to govern intensity when exercising independently       Able to check pulse independently Yes       Intervention Provide education and demonstration on how to check pulse in carotid and radial arteries.;Review the importance of being able to check your own pulse for safety during independent exercise       Expected Outcomes Short Term: Able to explain why pulse checking is important during independent exercise;Long Term: Able to check pulse independently and accurately       Understanding of Exercise Prescription Yes       Intervention Provide education, explanation, and written materials on patient's individual exercise prescription       Expected Outcomes Short Term: Able to explain program exercise prescription;Long Term: Able to explain home exercise prescription to exercise independently                Exercise Goals Re-Evaluation :  Exercise Goals Re-Evaluation     Row Name 06/23/22 1012 06/30/22 0955 06/30/22 1502 07/13/22 1547       Exercise Goal Re-Evaluation   Exercise Goals Review Increase Physical Activity;Able to understand and use rate of perceived exertion (RPE) scale;Knowledge and understanding of Target Heart Rate Range (THRR);Understanding of Exercise Prescription;Increase Strength and Stamina;Able to check pulse independently Increase Physical Activity;Able to understand and  use rate of perceived exertion (RPE) scale;Knowledge and understanding of Target Heart  Rate Range (THRR);Understanding of Exercise Prescription;Increase Strength and Stamina;Able to check pulse independently;Able to understand and use Dyspnea scale Increase Physical Activity;Increase Strength and Stamina;Understanding of Exercise Prescription Increase Physical Activity;Increase Strength and Stamina;Understanding of Exercise Prescription    Comments Reviewed RPE scale, THR and program prescription with pt today.  Pt voiced understanding and was given a copy of goals to take home. Mason Frank is off to a good start in rehab.  He has not been doing much at home with his afib.  He is going to start walking and trying the staff videos at home. Reviewed home exercise with pt today.  Pt plans to walking and staff vidoes for exercise.  Reviewed THR, pulse, RPE, sign and symptoms, pulse oximetery and when to call 911 or MD.  Also discussed weather considerations and indoor options.  Pt voiced understanding. Mason Frank is off to a good start in rehab. He had an average MET level of 2.15 METs during his first two exercise sessions. He also tolerated the treadmill at a speed of 1.8 mph and an incline of 0.5%. We will continue to monitor his progress in the program. Mason Frank continues to do well in rehab.  He started doing light intervals on the recumbent bike, varying levels 2-8. He also increased to level 4 on the T4 Nustep. His HR has been better controlled. We will continue to monitor.    Expected Outcomes Short: Use RPE daily to regulate intensity.  Long: Follow program prescription in THR. Short: Start to add in more exercise at home Long: Conitnue to build stamina Short: Continue to follow current exercise prescription. Long: Continue to increase strength and stamina. Short: Increase speed on treadmill Long: Continue to increase overall MET level             Discharge Exercise Prescription (Final Exercise Prescription  Changes):  Exercise Prescription Changes - 07/13/22 1500       Response to Exercise   Blood Pressure (Admit) 124/64    Blood Pressure (Exercise) 162/60    Blood Pressure (Exit) 102/60    Heart Rate (Admit) 72 bpm    Heart Rate (Exercise) 122 bpm    Heart Rate (Exit) 98 bpm    Rating of Perceived Exertion (Exercise) 14    Symptoms none    Duration Continue with 30 min of aerobic exercise without signs/symptoms of physical distress.    Intensity THRR unchanged      Progression   Progression Continue to progress workloads to maintain intensity without signs/symptoms of physical distress.    Average METs 2.14      Resistance Training   Training Prescription Yes    Weight 4 lb    Reps 10-15      Interval Training   Interval Training Yes    Equipment Recumbant Bike    Comments level 2-8; light rpm      Treadmill   MPH 1.1    Grade 1    Minutes 15    METs 2      Recumbant Bike   Level 2   intervals up to 8   Minutes 15    METs 2      NuStep   Level 4    Minutes 15    METs 2.2      Oxygen   Maintain Oxygen Saturation 88% or higher             Nutrition:  Target Goals: Understanding of nutrition guidelines, daily intake of sodium <156m, cholesterol <2063m calories  30% from fat and 7% or less from saturated fats, daily to have 5 or more servings of fruits and vegetables.  Education: All About Nutrition: -Group instruction provided by verbal, written material, interactive activities, discussions, models, and posters to present general guidelines for heart healthy nutrition including fat, fiber, MyPlate, the role of sodium in heart healthy nutrition, utilization of the nutrition label, and utilization of this knowledge for meal planning. Follow up email sent as well. Written material given at graduation.   Biometrics:  Pre Biometrics - 06/22/22 1251       Pre Biometrics   Height 6' 3.6" (1.92 m)    Weight 304 lb 1.6 oz (137.9 kg)    Waist Circumference 45  inches    Hip Circumference 48 inches    Waist to Hip Ratio 0.94 %    BMI (Calculated) 37.42    Single Leg Stand 8.8 seconds              Nutrition Therapy Plan and Nutrition Goals:  Nutrition Therapy & Goals - 06/22/22 0912       Nutrition Therapy   Diet Heart healthy, low Na    Drug/Food Interactions Statins/Certain Fruits    Protein (specify units) 100g    Fiber 33 grams    Whole Grain Foods 3 servings    Saturated Fats 18 max. grams    Fruits and Vegetables 8 servings/day    Sodium 1.5 grams      Personal Nutrition Goals   Nutrition Goal ST: review paperwork, consider preparing more meals at home. LT: limit eating out <2-3x/week, include 5-8 servings of fruit/vegetables, increase varitey, limit salt <2g/day    Comments 61 y.o. M admitted to cardiac rehab for heart failure. PMHx inlcudes bilateral pulmonary embolism, cancer, cardiomyopathy, COPD, CAD, HFrEF, HTN, HLD, MI, sleep apnea, gout. Relevant medications includes colchicine, jardiance, crestor, torsemide, trazodone. PYP Score: 70. Vegetables & Fruits 7/12. Breads, Grains & Cereals 11/12. Red & Processed Meat 10/12. Poultry 2/2. Fish & Shellfish 0/4. Beans, Nuts & Seeds 1/4. Milk & Dairy Foods 5/6. Toppings, Oils, Seasonings & Salt 14/20. Sweets, Snacks & Restaurant Food 11/14. Beverages 9/10.    B: cereal (2% milk and raisin bran or kashi) with OJ L: [place] food out to eat (chicken soup) or PB and J sandwich (whole grain natures own) D: sometimes will not eat (will have [place] food) Drinks: water. Mason Frank reports that he like salads as well sometimes during the week. Discussed general heart healthy eating and discussed considerations with eating out (whole grains are hard to find, limited variety, high fat red meat, usually vegetables are cooked with butter, high in sodium, etc). Suggested cooking more meals at home.      Intervention Plan   Intervention Prescribe, educate and counsel regarding individualized specific dietary  modifications aiming towards targeted core components such as weight, hypertension, lipid management, diabetes, heart failure and other comorbidities.;Nutrition handout(s) given to patient.    Expected Outcomes Short Term Goal: Understand basic principles of dietary content, such as calories, fat, sodium, cholesterol and nutrients.;Short Term Goal: A plan has been developed with personal nutrition goals set during dietitian appointment.;Long Term Goal: Adherence to prescribed nutrition plan.             Nutrition Assessments:  MEDIFICTS Score Key: ?70 Need to make dietary changes  40-70 Heart Healthy Diet ? 40 Therapeutic Level Cholesterol Diet  Flowsheet Row Cardiac Rehab from 06/22/2022 in [facility] Cardiac and Pulmonary Rehab  Picture Your Plate Total Score  on Admission 70      Picture Your Plate Scores: <74 Unhealthy dietary pattern with much room for improvement. 41-50 Dietary pattern unlikely to meet recommendations for good health and room for improvement. 51-60 More healthful dietary pattern, with some room for improvement.  >60 Healthy dietary pattern, although there may be some specific behaviors that could be improved.    Nutrition Goals Re-Evaluation:  Nutrition Goals Re-Evaluation     [place] Name 06/30/22 0959             Goals   Nutrition Goal ST: review paperwork, consider preparing more meals at home. LT: limit eating out <2-3x/week, include 5-8 servings of fruit/vegetables, increase varitey, limit salt <2g/day       Comment Mason Frank is doing well in rehab. He is trying to do better about making meals at home. He has not been going out as much.  He is trying to add in more fruit and vegetables, but not quite enough yet.  He is adding fruit to his cereal to help.  He is watching his sodium and doing better with it.  he is thinking about getting a juicer to help.       Expected Outcome Short: Continue to add in more fruits and vegetables.  Long; Continue to add variety                 Nutrition Goals Discharge (Final Nutrition Goals Re-Evaluation):  Nutrition Goals Re-Evaluation - 06/30/22 0959       Goals   Nutrition Goal ST: review paperwork, consider preparing more meals at home. LT: limit eating out <2-3x/week, include 5-8 servings of fruit/vegetables, increase varitey, limit salt <2g/day    Comment Mason Frank is doing well in rehab. He is trying to do better about making meals at home. He has not been going out as much.  He is trying to add in more fruit and vegetables, but not quite enough yet.  He is adding fruit to his cereal to help.  He is watching his sodium and doing better with it.  he is thinking about getting a juicer to help.    Expected Outcome Short: Continue to add in more fruits and vegetables.  Long; Continue to add variety             Psychosocial: Target Goals: Acknowledge presence or absence of significant depression and/or stress, maximize coping skills, provide positive support system. Participant is able to verbalize types and ability to use techniques and skills needed for reducing stress and depression.   Education: Stress, Anxiety, and Depression - Group verbal and visual presentation to define topics covered.  Reviews how body is impacted by stress, anxiety, and depression.  Also discusses healthy ways to reduce stress and to treat/manage anxiety and depression.  Written material given at graduation.   Education: Sleep Hygiene -Provides group verbal and written instruction about how sleep can affect your health.  Define sleep hygiene, discuss sleep cycles and impact of sleep habits. Review good sleep hygiene tips.    Initial Review & Psychosocial Screening:  Initial Psych Review & Screening - 06/15/22 1012       Initial Review   Current issues with Current Stress Concerns    Source of Stress Concerns Occupation    Comments He has lost his job recently and is doing ok with being out of work. He is ready to get his health  back in order so he can work a less demanding job. He does not take any  medicaions for his mood.      Family Dynamics   Good Support System? Yes    Comments He can look to his wife for support, sisters and brother. His two daughters are nearby also.      Barriers   Psychosocial barriers to participate in program The patient should benefit from training in stress management and relaxation.;There are no identifiable barriers or psychosocial needs.      Screening Interventions   Interventions Encouraged to exercise;To provide support and resources with identified psychosocial needs;Provide feedback about the scores to participant    Expected Outcomes Short Term goal: Utilizing psychosocial counselor, staff and physician to assist with identification of specific Stressors or current issues interfering with healing process. Setting desired goal for each stressor or current issue identified.;Long Term Goal: Stressors or current issues are controlled or eliminated.;Short Term goal: Identification and review with participant of any Quality of Life or Depression concerns found by scoring the questionnaire.;Long Term goal: The participant improves quality of Life and PHQ9 Scores as seen by post scores and/or verbalization of changes             Quality of Life Scores:   Quality of Life - 06/22/22 1251       Quality of Life   Select Quality of Life      Quality of Life Scores   Health/Function Pre 20.07 %    Socioeconomic Pre 21.5 %    Psych/Spiritual Pre 22.86 %    Family Pre 24 %    GLOBAL Pre 21.51 %            Scores of 19 and below usually indicate a poorer quality of life in these areas.  A difference of  2-3 points is a clinically meaningful difference.  A difference of 2-3 points in the total score of the Quality of Life Index has been associated with significant improvement in overall quality of life, self-image, physical symptoms, and general health in studies assessing change in  quality of life.  PHQ-9: Review Flowsheet       06/22/2022 06/15/2019 04/26/2019  Depression screen PHQ 2/9  Decreased Interest 0 0 1  Down, Depressed, Hopeless 0 0 0  PHQ - 2 Score 0 0 1  Altered sleeping 1 0 0  Tired, decreased energy _0 Change in appetite 1 1 0  Feeling bad or failure about yourself  0 0 0  Trouble concentrating 0 0 0  Moving slowly or fidgety/restless 0 0 0  Suicidal thoughts 0 0 0  PHQ-9 Score _1 Difficult doing work/chores Not difficult at all Not difficult at all Not difficult at all   Interpretation of Total Score  Total Score Depression Severity:  1-4 = Minimal depression, 5-9 = Mild depression, 10-14 = Moderate depression, 15-19 = Moderately severe depression, 20-27 = Severe depression   Psychosocial Evaluation and Intervention:  Psychosocial Evaluation - 06/15/22 1015       Psychosocial Evaluation & Interventions   Interventions Encouraged to exercise with the program and follow exercise prescription;Relaxation education;Stress management education    Comments He has lost his job recently and is doing ok with being out of work. He is ready to get his health back in order so he can work a less demanding job. He does not take any medicaions for his mood.    Expected Outcomes Short: Start [facility] to help with mood. Long: Maintain a healthy mental state    Continue Psychosocial Services  Follow up required by staff             Psychosocial Re-Evaluation:  Psychosocial Re-Evaluation     Row Name 06/30/22 0956             Psychosocial Re-Evaluation   Current issues with Current Stress Concerns       Comments Mason Frank is doing well in rehab.  He denies any major stressors currently.  He is currently out of work, but finances have been okay.  His wife is worried, but he feels good.  He is sleeping fairly well when he is able to sleep.  He has trouble getting to sleep.  He has trazadone to help as melatonin did not help.  We talked about  taking the right does for him.       Expected Outcomes Shrot: Continue to work on sleep Long; Conitnue to exercise for mental boost       Interventions Encouraged to attend Cardiac Rehabilitation for the exercise                Psychosocial Discharge (Final Psychosocial Re-Evaluation):  Psychosocial Re-Evaluation - 06/30/22 0956       Psychosocial Re-Evaluation   Current issues with Current Stress Concerns    Comments Mason Frank is doing well in rehab.  He denies any major stressors currently.  He is currently out of work, but finances have been okay.  His wife is worried, but he feels good.  He is sleeping fairly well when he is able to sleep.  He has trouble getting to sleep.  He has trazadone to help as melatonin did not help.  We talked about taking the right does for him.    Expected Outcomes Shrot: Continue to work on sleep Long; Conitnue to exercise for mental boost    Interventions Encouraged to attend Cardiac Rehabilitation for the exercise             Vocational Rehabilitation: Provide vocational rehab assistance to qualifying candidates.   Vocational Rehab Evaluation & Intervention:  Vocational Rehab - 06/22/22 1252       Initial Vocational Rehab Evaluation & Intervention   Assessment shows need for Vocational Rehabilitation No             Education: Education Goals: Education classes will be provided on a variety of topics geared toward better understanding of heart health and risk factor modification. Participant will state understanding/return demonstration of topics presented as noted by education test scores.  Learning Barriers/Preferences:  Learning Barriers/Preferences - 06/15/22 1012       Learning Barriers/Preferences   Learning Barriers None    Learning Preferences None             General Cardiac Education Topics:  AED/CPR: - Group verbal and written instruction with the use of models to demonstrate the basic use of the AED with the basic  ABC's of resuscitation.   Anatomy and Cardiac Procedures: - Group verbal and visual presentation and models provide information about basic cardiac anatomy and function. Reviews the testing methods done to diagnose heart disease and the outcomes of the test results. Describes the treatment choices: Medical Management, Angioplasty, or Coronary Bypass Surgery for treating various heart conditions including Myocardial Infarction, Angina, Valve Disease, and Cardiac Arrhythmias.  Written material given at graduation. Flowsheet Row Cardiac Rehab from 07/14/2022 in [facility] Cardiac and Pulmonary Rehab  Education need identified 06/22/22       Medication Safety: - Group verbal and visual instruction to review commonly  prescribed medications for heart and lung disease. Reviews the medication, class of the drug, and side effects. Includes the steps to properly store meds and maintain the prescription regimen.  Written material given at graduation.   Intimacy: - Group verbal instruction through game format to discuss how heart and lung disease can affect sexual intimacy. Written material given at graduation..   Know Your Numbers and Heart Failure: - Group verbal and visual instruction to discuss disease risk factors for cardiac and pulmonary disease and treatment options.  Reviews associated critical values for Overweight/Obesity, Hypertension, Cholesterol, and Diabetes.  Discusses basics of heart failure: signs/symptoms and treatments.  Introduces Heart Failure Zone chart for action plan for heart failure.  Written material given at graduation.   Infection Prevention: - Provides verbal and written material to individual with discussion of infection control including proper hand washing and proper equipment cleaning during exercise session. Flowsheet Row Cardiac Rehab from 07/14/2022 in [facility] Cardiac and Pulmonary Rehab  Date 06/15/22  Educator [facility]  Instruction Review Code 1- Verbalizes Understanding        Falls Prevention: - Provides verbal and written material to individual with discussion of falls prevention and safety. Flowsheet Row Cardiac Rehab from 07/14/2022 in [facility] Cardiac and Pulmonary Rehab  Date 06/15/22  Educator [facility]  Instruction Review Code 1- Verbalizes Understanding       Other: -Provides group and verbal instruction on various topics (see comments)   Knowledge Questionnaire Score:  Knowledge Questionnaire Score - 06/22/22 1252       Knowledge Questionnaire Score   Pre Score 24/26             Core Components/Risk Factors/Patient Goals at Admission:  Personal Goals and Risk Factors at Admission - 06/22/22 1253       Core Components/Risk Factors/Patient Goals on Admission    Weight Management Yes;Weight Loss;Obesity    Intervention Weight Management/Obesity: Establish reasonable short term and long term weight goals.;Weight Management: Develop a combined nutrition and exercise program designed to reach desired caloric intake, while maintaining appropriate intake of nutrient and fiber, sodium and fats, and appropriate energy expenditure required for the weight goal.;Weight Management: Provide education and appropriate resources to help participant work on and attain dietary goals.;Obesity: Provide education and appropriate resources to help participant work on and attain dietary goals.    Admit Weight 304 lb 1.6 oz (137.9 kg)    Goal Weight: Short Term 299 lb (135.6 kg)    Goal Weight: Long Term 269 lb (122 kg)    Expected Outcomes Short Term: Continue to assess and modify interventions until short term weight is achieved;Long Term: Adherence to nutrition and physical activity/exercise program aimed toward attainment of established weight goal;Weight Loss: Understanding of general recommendations for a balanced deficit meal plan, which promotes 1-2 lb weight loss per week and includes a negative energy balance of [phone] kcal/d;Understanding recommendations for  meals to include 15-35% energy as protein, 25-35% energy from fat, 35-60% energy from carbohydrates, less than 293m of dietary cholesterol, 20-35 gm of total fiber daily;Understanding of distribution of calorie intake throughout the day with the consumption of 4-5 meals/snacks    Heart Failure Yes    Intervention Provide a combined exercise and nutrition program that is supplemented with education, support and counseling about heart failure. Directed toward relieving symptoms such as shortness of breath, decreased exercise tolerance, and extremity edema.    Expected Outcomes Improve functional capacity of life;Short term: Attendance in program 2-3 days a week with increased exercise  capacity. Reported lower sodium intake. Reported increased fruit and vegetable intake. Reports medication compliance.;Short term: Daily weights obtained and reported for increase. Utilizing diuretic protocols set by physician.;Long term: Adoption of self-care skills and reduction of barriers for early signs and symptoms recognition and intervention leading to self-care maintenance.    Hypertension Yes    Intervention Provide education on lifestyle modifcations including regular physical activity/exercise, weight management, moderate sodium restriction and increased consumption of fresh fruit, vegetables, and low fat dairy, alcohol moderation, and smoking cessation.;Monitor prescription use compliance.    Expected Outcomes Short Term: Continued assessment and intervention until BP is < 140/60m HG in hypertensive participants. < 130/876mHG in hypertensive participants with diabetes, heart failure or chronic kidney disease.;Long Term: Maintenance of blood pressure at goal levels.    Lipids Yes    Intervention Provide education and support for participant on nutrition & aerobic/resistive exercise along with prescribed medications to achieve LDL <7023mHDL >8m52m  Expected Outcomes Short Term: Participant states understanding of  desired cholesterol values and is compliant with medications prescribed. Participant is following exercise prescription and nutrition guidelines.;Long Term: Cholesterol controlled with medications as prescribed, with individualized exercise RX and with personalized nutrition plan. Value goals: LDL < 70mg38mL > 40 mg.             Education:Diabetes - Individual verbal and written instruction to review signs/symptoms of diabetes, desired ranges of glucose level fasting, after meals and with exercise. Acknowledge that pre and post exercise glucose checks will be done for 3 sessions at entry of program.   Core Components/Risk Factors/Patient Goals Review:   Goals and Risk Factor Review     Row Name 06/30/22 1001             Core Components/Risk Factors/Patient Goals Review   Personal Goals Review Weight Management/Obesity;Lipids;Hypertension;Heart Failure       Review Mason Frank well in rehab. His weight is trending down. He can almost see his feet again. His pressures have been running low in rehab. He is not checking them at home so we talked about getting back to the habit of checking again.  He has not had any heart failure symptoms other than fatigue and SOB with activity, but that is why he is back in rehab.  Mason Frank to add in mroe exercise at home.       Expected Outcomes Short: Continue to work on weight loss and adding variety Long; Conitnue to monitor risk factors                Core Components/Risk Factors/Patient Goals at Discharge (Final Review):   Goals and Risk Factor Review - 06/30/22 1001       Core Components/Risk Factors/Patient Goals Review   Personal Goals Review Weight Management/Obesity;Lipids;Hypertension;Heart Failure    Review Mason Frank well in rehab. His weight is trending down. He can almost see his feet again. His pressures have been running low in rehab. He is not checking them at home so we talked about getting back to the habit of  checking again.  He has not had any heart failure symptoms other than fatigue and SOB with activity, but that is why he is back in rehab.  Mason Frank to add in mroe exercise at home.    Expected Outcomes Short: Continue to work on weight loss and adding variety Long; Conitnue to monitor risk factors  ITP Comments:  ITP Comments     Row Name 06/15/22 1010 06/22/22 1242 06/23/22 1012 06/24/22 1031 07/22/22 1055   ITP Comments Virtual Visit completed. Patient informed on EP and RD appointment and 6 Minute walk test. Patient also informed of patient health questionnaires on My Chart. Patient Verbalizes understanding. Visit diagnosis can be found in [facility] 04/08/2022. Completed 6MWT and gym orientation. Initial ITP created and sent for review to Dr. Emily Filbert, Medical Director. First full day of exercise!  Patient was oriented to gym and equipment including functions, settings, policies, and procedures.  Patient's individual exercise prescription and treatment plan were reviewed.  All starting workloads were established based on the results of the 6 minute walk test done at initial orientation visit.  The plan for exercise progression was also introduced and progression will be customized based on patient's performance and goals. 30 Day review completed. Medical Director ITP review done, changes made as directed, and signed approval by Medical Director.   NEW TO PROGRAM 30 Day review completed. Medical Director ITP review done, changes made as directed, and signed approval by Medical Director.            Comments:

## 2022-07-23 ENCOUNTER — Encounter: Payer: BC Managed Care – PPO | Admitting: *Deleted

## 2022-07-23 DIAGNOSIS — I5042 Chronic combined systolic (congestive) and diastolic (congestive) heart failure: Secondary | ICD-10-CM | POA: Diagnosis not present

## 2022-07-23 NOTE — Progress Notes (Signed)
Daily Session Note  Patient Details  Name: Mason Frank MRN: [id number] Date of Birth: 10/31/1960 Referring Provider:   Flowsheet Row Cardiac Rehab from 06/22/2022 in [facility] Cardiac and Pulmonary Rehab  Referring Provider Lujean Amel MD       Encounter Date: 07/23/2022  Check In:  Session Check In - 07/23/22 1033       Check-In   Supervising physician immediately available to respond to emergencies See telemetry face sheet for immediately available ER MD    Location [facility]-Cardiac & Pulmonary Rehab    Staff Present Heath Lark, RN, BSN, CCRP;Noah Tickle, BS, Exercise Physiologist;Jessica [place], MA, RCEP, CCRP, CCET    Virtual Visit No    Medication changes reported     No    Fall or balance concerns reported    No    Warm-up and Cool-down Performed on first and last piece of equipment    Resistance Training Performed Yes    VAD Patient? No    PAD/SET Patient? No      Pain Assessment   Currently in Pain? No/denies                Social History   Tobacco Use  Smoking Status Former   Packs/day: 1.00   Years: 45.00   Total pack years: 45.00   Types: Cigarettes   Quit date: 07/24/2021   Years since quitting: 0.9  Smokeless Tobacco Never    Goals Met:  Independence with exercise equipment Exercise tolerated well No report of concerns or symptoms today  Goals Unmet:  Not Applicable  Comments: Pt able to follow exercise prescription today without complaint.  Will continue to monitor for progression.    Dr. Emily Filbert is Medical Director for [place].  Dr. Ottie Glazier is Medical Director for [facility] Pulmonary Rehabilitation.

## 2022-07-28 ENCOUNTER — Encounter: Payer: BC Managed Care – PPO | Attending: Internal Medicine | Admitting: *Deleted

## 2022-07-28 DIAGNOSIS — Z5189 Encounter for other specified aftercare: Secondary | ICD-10-CM | POA: Diagnosis not present

## 2022-07-28 DIAGNOSIS — I5042 Chronic combined systolic (congestive) and diastolic (congestive) heart failure: Secondary | ICD-10-CM | POA: Diagnosis present

## 2022-07-28 NOTE — Progress Notes (Signed)
Daily Session Note  Patient Details  Name: Mason Frank MRN: [id number] Date of Birth: 1961-01-11 Referring Provider:   Flowsheet Row Cardiac Rehab from 06/22/2022 in [facility] Cardiac and Pulmonary Rehab  Referring Provider Lujean Amel MD       Encounter Date: 07/28/2022  Check In:  Session Check In - 07/28/22 1009       Check-In   Supervising physician immediately available to respond to emergencies See telemetry face sheet for immediately available ER MD    Location [facility]-Cardiac & Pulmonary Rehab    Staff Present Heath Lark, RN, BSN, CCRP;Jessica [place], MA, RCEP, CCRP, Bertram Gala, MS, ACSM CEP, Exercise Physiologist    Virtual Visit No    Medication changes reported     No    Fall or balance concerns reported    No    Warm-up and Cool-down Performed on first and last piece of equipment    Resistance Training Performed Yes    VAD Patient? No    PAD/SET Patient? No      Pain Assessment   Currently in Pain? No/denies                Social History   Tobacco Use  Smoking Status Former   Packs/day: 1.00   Years: 45.00   Total pack years: 45.00   Types: Cigarettes   Quit date: 07/24/2021   Years since quitting: 1.0  Smokeless Tobacco Never    Goals Met:  Independence with exercise equipment Exercise tolerated well No report of concerns or symptoms today  Goals Unmet:  Not Applicable  Comments: Pt able to follow exercise prescription today without complaint.  Will continue to monitor for progression.    Dr. Emily Filbert is Medical Director for [place].  Dr. Ottie Glazier is Medical Director for [facility] Pulmonary Rehabilitation.

## 2022-07-30 ENCOUNTER — Encounter: Payer: BC Managed Care – PPO | Admitting: *Deleted

## 2022-07-30 DIAGNOSIS — I5042 Chronic combined systolic (congestive) and diastolic (congestive) heart failure: Secondary | ICD-10-CM | POA: Diagnosis not present

## 2022-07-30 NOTE — Progress Notes (Signed)
Daily Session Note  Patient Details  Name: Mason Frank MRN: [id number] Date of Birth: September 15, 1960 Referring Provider:   Flowsheet Row Cardiac Rehab from 06/22/2022 in [facility] Cardiac and Pulmonary Rehab  Referring Provider Lujean Amel MD       Encounter Date: 07/30/2022  Check In:  Session Check In - 07/30/22 0916       Check-In   Supervising physician immediately available to respond to emergencies See telemetry face sheet for immediately available ER MD    Location [facility]-Cardiac & Pulmonary Rehab    Staff Present Heath Lark, RN, BSN, CCRP;Aden Sek [place], MA, RCEP, CCRP, Bertram Gala, MS, ACSM CEP, Exercise Physiologist;Noah Tickle, BS, Exercise Physiologist    Virtual Visit No    Medication changes reported     No    Fall or balance concerns reported    No    Warm-up and Cool-down Performed on first and last piece of equipment    Resistance Training Performed Yes    VAD Patient? No    PAD/SET Patient? No      Pain Assessment   Currently in Pain? No/denies                Social History   Tobacco Use  Smoking Status Former   Packs/day: 1.00   Years: 45.00   Total pack years: 45.00   Types: Cigarettes   Quit date: 07/24/2021   Years since quitting: 1.0  Smokeless Tobacco Never    Goals Met:  Independence with exercise equipment Exercise tolerated well No report of concerns or symptoms today Strength training completed today  Goals Unmet:  Not Applicable  Comments: Pt able to follow exercise prescription today without complaint.  Will continue to monitor for progression. \   Dr. Emily Filbert is Medical Director for [place].  Dr. Ottie Glazier is Medical Director for [facility] Pulmonary Rehabilitation.

## 2022-08-03 ENCOUNTER — Encounter: Payer: BC Managed Care – PPO | Admitting: *Deleted

## 2022-08-03 DIAGNOSIS — I5042 Chronic combined systolic (congestive) and diastolic (congestive) heart failure: Secondary | ICD-10-CM

## 2022-08-03 NOTE — Progress Notes (Signed)
Daily Session Note  Patient Details  Name: Mason Frank MRN: [id number] Date of Birth: 01-12-1961 Referring Provider:   Flowsheet Row Cardiac Rehab from 06/22/2022 in [facility] Cardiac and Pulmonary Rehab  Referring Provider Lujean Amel MD       Encounter Date: 08/03/2022  Check In:  Session Check In - 08/03/22 0933       Check-In   Supervising physician immediately available to respond to emergencies See telemetry face sheet for immediately available ER MD    Location [facility]-Cardiac & Pulmonary Rehab    Staff Present Darlyne Russian, RN, Doyce Para, BS, ACSM CEP, Exercise Physiologist;Noah Tickle, BS, Exercise Physiologist    Virtual Visit No    Medication changes reported     No    Fall or balance concerns reported    No    Warm-up and Cool-down Performed on first and last piece of equipment    Resistance Training Performed Yes    VAD Patient? No    PAD/SET Patient? No      Pain Assessment   Currently in Pain? No/denies                Social History   Tobacco Use  Smoking Status Former   Packs/day: 1.00   Years: 45.00   Total pack years: 45.00   Types: Cigarettes   Quit date: 07/24/2021   Years since quitting: 1.0  Smokeless Tobacco Never    Goals Met:  Independence with exercise equipment Exercise tolerated well No report of concerns or symptoms today Strength training completed today  Goals Unmet:  Not Applicable  Comments: Pt able to follow exercise prescription today without complaint.  Will continue to monitor for progression.    Dr. Emily Filbert is Medical Director for [place].  Dr. Ottie Glazier is Medical Director for [facility] Pulmonary Rehabilitation.

## 2022-08-07 ENCOUNTER — Encounter: Payer: BC Managed Care – PPO | Admitting: *Deleted

## 2022-08-07 DIAGNOSIS — I5042 Chronic combined systolic (congestive) and diastolic (congestive) heart failure: Secondary | ICD-10-CM | POA: Diagnosis not present

## 2022-08-07 NOTE — Progress Notes (Signed)
Daily Session Note  Patient Details  Name: Mason Frank MRN: [id number] Date of Birth: 1960/10/27 Referring Provider:   Flowsheet Row Cardiac Rehab from 06/22/2022 in [facility]  Referring Provider Lujean Amel MD       Encounter Date: 08/07/2022  Check In:  Session Check In - 08/07/22 1014       Check-In   Supervising physician immediately available to respond to emergencies See telemetry face sheet for immediately available ER MD    Location [facility]    Staff Present Heath Lark, RN, BSN, CCRP;Jessica [place], MA, RCEP, CCRP, CCET;Joseph Caney, [place]    Virtual Visit No    Medication changes reported     No    Fall or balance concerns reported    No    Warm-up and Cool-down Performed on first and last piece of equipment    Resistance Training Performed Yes    VAD Patient? No    PAD/SET Patient? No      Pain Assessment   Currently in Pain? No/denies                Social History   Tobacco Use  Smoking Status Former   Packs/day: 1.00   Years: 45.00   Total pack years: 45.00   Types: Cigarettes   Quit date: 07/24/2021   Years since quitting: 1.0  Smokeless Tobacco Never    Goals Met:  Independence with exercise equipment Exercise tolerated well No report of concerns or symptoms today  Goals Unmet:  Not Applicable  Comments: Pt able to follow exercise prescription today without complaint.  Will continue to monitor for progression.    Dr. Emily Filbert is Medical Director for [facility].  Dr. Ottie Glazier is Medical Director for [facility].

## 2022-08-11 ENCOUNTER — Encounter: Payer: BC Managed Care – PPO | Admitting: *Deleted

## 2022-08-11 DIAGNOSIS — I5042 Chronic combined systolic (congestive) and diastolic (congestive) heart failure: Secondary | ICD-10-CM

## 2022-08-11 NOTE — Progress Notes (Signed)
Daily Session Note  Patient Details  Name: Mason Frank MRN: [id number] Date of Birth: [birth year] Referring Provider:   Flowsheet Row Cardiac Rehab from 06/22/2022 in [facility]  Referring Provider Lujean Amel MD       Encounter Date: 08/11/2022  Check In:  Session Check In - 08/11/22 0941       Check-In   Supervising physician immediately available to respond to emergencies See telemetry face sheet for immediately available ER MD    Location [facility]    Staff Present Heath Lark, RN, BSN, CCRP;Jessica [place], MA, RCEP, CCRP, Bertram Gala, MS, ACSM CEP, Exercise Physiologist    Virtual Visit No    Medication changes reported     No    Fall or balance concerns reported    No    Warm-up and Cool-down Performed on first and last piece of equipment    Resistance Training Performed Yes    VAD Patient? No    PAD/SET Patient? No      Pain Assessment   Currently in Pain? No/denies                Social History   Tobacco Use  Smoking Status Former   Packs/day: 1.00   Years: 45.00   Total pack years: 45.00   Types: Cigarettes   Quit date: 07/24/2021   Years since quitting: 1.0  Smokeless Tobacco Never    Goals Met:  Independence with exercise equipment Exercise tolerated well No report of concerns or symptoms today  Goals Unmet:  Not Applicable  Comments: Pt able to follow exercise prescription today without complaint.  Will continue to monitor for progression.    Dr. Emily Filbert is Medical Director for [facility].  Dr. Ottie Glazier is Medical Director for [facility] Pulmonary Rehabilitation.

## 2022-08-13 ENCOUNTER — Encounter: Payer: BC Managed Care – PPO | Admitting: *Deleted

## 2022-08-13 DIAGNOSIS — I5042 Chronic combined systolic (congestive) and diastolic (congestive) heart failure: Secondary | ICD-10-CM

## 2022-08-13 NOTE — Progress Notes (Signed)
Daily Session Note  Patient Details  Name: Mason Frank MRN: [id number] Date of Birth: 01/31/1961 Referring Provider:   Flowsheet Row Cardiac Rehab from 06/22/2022 in [facility]  Referring Provider Callwood, Dwayne MD       Encounter Date: 08/13/2022  Check In:  Session Check In - 08/13/22 1535       Check-In   Supervising physician immediately available to respond to emergencies See telemetry face sheet for immediately available ER MD    Location [facility]    Staff Present Meredith Craven, RN BSN;Joseph Hood, RCP,RRT,BSRT;Susanne Bice, RN, BSN, CCRP    Virtual Visit No    Medication changes reported     No    Fall or balance concerns reported    No    Warm-up and Cool-down Performed on first and last piece of equipment    Resistance Training Performed Yes    VAD Patient? No    PAD/SET Patient? No      Pain Assessment   Currently in Pain? No/denies                Social History   Tobacco Use  Smoking Status Former   Packs/day: 1.00   Years: 45.00   Total pack years: 45.00   Types: Cigarettes   Quit date: 07/24/2021   Years since quitting: 1.0  Smokeless Tobacco Never    Goals Met:  Independence with exercise equipment Exercise tolerated well No report of concerns or symptoms today Strength training completed today  Goals Unmet:  Not Applicable  Comments: Pt able to follow exercise prescription today without complaint.  Will continue to monitor for progression.    Dr. Mark Miller is Medical Director for [facility].  Dr. Fuad Aleskerov is Medical Director for [facility]monary [facility]. 

## 2022-08-18 ENCOUNTER — Encounter: Payer: BC Managed Care – PPO | Admitting: *Deleted

## 2022-08-18 DIAGNOSIS — I5042 Chronic combined systolic (congestive) and diastolic (congestive) heart failure: Secondary | ICD-10-CM | POA: Diagnosis not present

## 2022-08-18 NOTE — Progress Notes (Signed)
Daily Session Note  Patient Details  Name: Mason Frank MRN: [id number] Date of Birth: 10/16/1960 Referring Provider:   Flowsheet Row Cardiac Rehab from 06/22/2022 in [facility]  Referring Provider Lujean Amel MD       Encounter Date: 08/18/2022  Check In:  Session Check In - 08/18/22 1008       Check-In   Supervising physician immediately available to respond to emergencies See telemetry face sheet for immediately available ER MD    Location [facility]    Staff Present Heath Lark, RN, BSN, CCRP;Jessica [place], MA, RCEP, CCRP, CCET;Noah Tickle, BS, Exercise Physiologist    Virtual Visit No    Medication changes reported     No    Fall or balance concerns reported    No    Warm-up and Cool-down Performed on first and last piece of equipment    Resistance Training Performed Yes    VAD Patient? No    PAD/SET Patient? No      Pain Assessment   Currently in Pain? No/denies                Social History   Tobacco Use  Smoking Status Former   Packs/day: 1.00   Years: 45.00   Total pack years: 45.00   Types: Cigarettes   Quit date: 07/24/2021   Years since quitting: 1.0  Smokeless Tobacco Never    Goals Met:  Independence with exercise equipment Exercise tolerated well No report of concerns or symptoms today  Goals Unmet:  Not Applicable  Comments: Pt able to follow exercise prescription today without complaint.  Will continue to monitor for progression.    Dr. Emily Filbert is Medical Director for [facility].  Dr. Ottie Glazier is Medical Director for [facility] Pulmonary Rehabilitation.

## 2022-08-19 ENCOUNTER — Encounter: Payer: Self-pay | Admitting: *Deleted

## 2022-08-19 DIAGNOSIS — I5042 Chronic combined systolic (congestive) and diastolic (congestive) heart failure: Secondary | ICD-10-CM

## 2022-08-19 NOTE — Progress Notes (Signed)
Cardiac Individual Treatment Plan  Patient Details  Name: Mason Frank MRN: [id number] Date of Birth: [birth year] Referring Provider:   Flowsheet Row Cardiac Rehab from 06/22/2022 in [facility] Cardiac and Pulmonary Rehab  Referring Provider Lujean Amel MD       Initial Encounter Date:  Flowsheet Row Cardiac Rehab from 06/22/2022 in [facility] Cardiac and Pulmonary Rehab  Date 06/22/22       Visit Diagnosis: Heart failure, systolic and diastolic, chronic ([place])  Patient's Home Medications on Admission:  Current Outpatient Medications:    allopurinol (ZYLOPRIM) 100 MG tablet, Take 100 mg by mouth daily at 12 noon. (Patient not taking: Reported on 06/15/2022), Disp: , Rfl:    allopurinol (ZYLOPRIM) 100 MG tablet, Take 1 tablet by mouth daily., Disp: , Rfl:    amiodarone (PACERONE) 200 MG tablet, Take by mouth., Disp: , Rfl:    apixaban (ELIQUIS) 5 MG TABS tablet, Take 5 mg by mouth 2 (two) times daily. (Patient not taking: Reported on 06/15/2022), Disp: , Rfl:    apixaban (ELIQUIS) 5 MG TABS tablet, Take by mouth., Disp: , Rfl:    aspirin EC 81 MG tablet, Take 81 mg by mouth daily at 12 noon. (Patient not taking: Reported on 06/15/2022), Disp: , Rfl:    aspirin EC 81 MG tablet, Take by mouth., Disp: , Rfl:    atorvastatin (LIPITOR) 20 MG tablet, Take by mouth. (Patient not taking: Reported on 06/15/2022), Disp: , Rfl:    clopidogrel (PLAVIX) 300 MG TABS tablet, Take by mouth., Disp: , Rfl:    colchicine 0.6 MG tablet, Take 0.6 mg by mouth daily as needed (gout flares). (Patient not taking: Reported on 07/02/2021), Disp: , Rfl:    colchicine 0.6 MG tablet, Take 2 tablets (1.82m) by mouth at first sign of gout flare followed by 1 tablet (0.633m after 1 hour. (Max 1.43m49mithin 1 hour), Disp: , Rfl:    doxycycline (VIBRAMYCIN) 50 MG capsule, Take 1 capsule (50 mg total) by mouth daily. Take with food. (Patient not taking: Reported on 03/12/2022), Disp: 90 capsule, Rfl: 1   empagliflozin  (JARDIANCE) 10 MG TABS tablet, Take 10 mg by mouth daily. (Patient not taking: Reported on 06/15/2022), Disp: , Rfl:    empagliflozin (JARDIANCE) 10 MG TABS tablet, Take 1 tablet by mouth daily., Disp: , Rfl:    Fluticasone-Umeclidin-Vilant (TRELEGY ELLIPTA) 100-62.5-25 MCG/ACT AEPB, Inhale into the lungs daily. (Patient not taking: Reported on 06/15/2022), Disp: , Rfl:    Fluticasone-Umeclidin-Vilant (TRELEGY ELLIPTA) 100-62.5-25 MCG/ACT AEPB, Inhale into the lungs., Disp: , Rfl:    furosemide (LASIX) 20 MG tablet, Take 20 mg by mouth daily at 12 noon. (Patient not taking: Reported on 03/12/2022), Disp: , Rfl:    ketoconazole (NIZORAL) 2 % cream, For fungal infection apply to the feet QHS., Disp: 60 g, Rfl: 6   ketoconazole (NIZORAL) 2 % cream, Apply topically. (Patient not taking: Reported on 06/15/2022), Disp: , Rfl:    losartan (COZAAR) 100 MG tablet, Take 100 mg by mouth daily at 12 noon. (Patient not taking: Reported on 06/15/2022), Disp: , Rfl:    metoprolol succinate (TOPROL-XL) 25 MG 24 hr tablet, Take by mouth daily at 12 noon. (Patient not taking: Reported on 06/15/2022), Disp: , Rfl:    metoprolol succinate (TOPROL-XL) 25 MG 24 hr tablet, Take 1 tablet by mouth daily., Disp: , Rfl:    naproxen sodium (ALEVE) 220 MG tablet, Take 440 mg by mouth daily as needed (pain). (Patient not taking: Reported on 06/15/2022), Disp: ,  Rfl:    potassium chloride (KLOR-CON) 20 MEQ packet, Take 20 mEq by mouth daily at 12 noon. (Patient not taking: Reported on 07/02/2021), Disp: , Rfl:    rosuvastatin (CRESTOR) 20 MG tablet, Take by mouth., Disp: , Rfl:    rosuvastatin (CRESTOR) 20 MG tablet, Take by mouth., Disp: , Rfl:    sacubitril-valsartan (ENTRESTO) 24-26 MG, Take 1 tablet by mouth 2 (two) times daily. (Patient not taking: Reported on 06/15/2022), Disp: , Rfl:    sacubitril-valsartan (ENTRESTO) 24-26 MG, Take 1 tablet by mouth 2 (two) times daily., Disp: , Rfl:    spironolactone (ALDACTONE) 25 MG  tablet, Take 25 mg by mouth daily., Disp: , Rfl:    torsemide (DEMADEX) 20 MG tablet, Take 20 mg by mouth daily. (Patient not taking: Reported on 06/15/2022), Disp: , Rfl:    TraZODone & Diet Manage Prod (TRAZAMINE PO), Take by mouth. (Patient not taking: Reported on 06/15/2022), Disp: , Rfl:    traZODone (DESYREL) 50 MG tablet, Take 50 mg by mouth at bedtime., Disp: , Rfl:   Past Medical History: Past Medical History:  Diagnosis Date   Cancer ([place]) 1990   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   Dysrhythmia    Pulmonary embolism ([place])    Sleep apnea     Tobacco Use: Social History   Tobacco Use  Smoking Status Former   Packs/day: 1.00   Years: 45.00   Total pack years: 45.00   Types: Cigarettes   Quit date: 07/24/2021   Years since quitting: 1.0  Smokeless Tobacco Never    Labs: Review Flowsheet       Latest Ref Rng & Units 10/17/2012  Labs for ITP Cardiac and Pulmonary Rehab  Cholestrol 0 - 200 mg/dL 239   LDL (calc) 0 - 100 mg/dL 164   HDL-C 40 - 60 mg/dL 59   Trlycerides 0 - 200 mg/dL 81      Exercise Target Goals: Exercise Program Goal: Individual exercise prescription set using results from initial 6 min walk test and THRR while considering  patient's activity barriers and safety.   Exercise Prescription Goal: Initial exercise prescription builds to 30-45 minutes a day of aerobic activity, 2-3 days per week.  Home exercise guidelines will be given to patient during program as part of exercise prescription that the participant will acknowledge.   Education: Aerobic Exercise: - Group verbal and visual presentation on the components of exercise prescription. Introduces F.I.T.T principle from ACSM for exercise prescriptions.  Reviews F.I.T.T. principles of aerobic exercise including progression. Written material given at graduation.   Education: Resistance Exercise: - Group verbal and visual presentation on the components of exercise prescription. Introduces  F.I.T.T principle from ACSM for exercise prescriptions  Reviews F.I.T.T. principles of resistance exercise including progression. Written material given at graduation. Flowsheet Row Cardiac Rehab from 07/30/2022 in [facility] Cardiac and Pulmonary Rehab  Date 07/14/22  Educator NT  Instruction Review Code 1- Verbalizes Understanding        Education: Exercise & Equipment Safety: - Individual verbal instruction and demonstration of equipment use and safety with use of the equipment. Flowsheet Row Cardiac Rehab from 07/30/2022 in [facility] Cardiac and Pulmonary Rehab  Date 06/15/22  Educator [facility]  Instruction Review Code 1- Verbalizes Understanding       Education: Exercise Physiology & General Exercise Guidelines: - Group verbal and written instruction with models to review the exercise physiology of the cardiovascular system and associated critical values. Provides general exercise guidelines with specific  guidelines to those with heart or lung disease.  Flowsheet Row Cardiac Rehab from 07/30/2022 in [facility] Cardiac and Pulmonary Rehab  Date 06/25/22  Educator [facility]  Instruction Review Code 1- Verbalizes Understanding       Education: Flexibility, Balance, Mind/Body Relaxation: - Group verbal and visual presentation with interactive activity on the components of exercise prescription. Introduces F.I.T.T principle from ACSM for exercise prescriptions. Reviews F.I.T.T. principles of flexibility and balance exercise training including progression. Also discusses the mind body connection.  Reviews various relaxation techniques to help reduce and manage stress (i.e. Deep breathing, progressive muscle relaxation, and visualization). Balance handout provided to take home. Written material given at graduation. Flowsheet Row Cardiac Rehab from 07/30/2022 in [facility] Cardiac and Pulmonary Rehab  Date 07/14/22  Educator NT  Instruction Review Code 1- Verbalizes Understanding       Activity Barriers & Risk  Stratification:  Activity Barriers & Cardiac Risk Stratification - 06/22/22 1246       Activity Barriers & Cardiac Risk Stratification   Activity Barriers Shortness of Breath;Decreased Ventricular Function;Balance Concerns;Muscular Weakness;Deconditioning    Cardiac Risk Stratification High             6 Minute Walk:  6 Minute Walk     Row Name 06/22/22 1244         6 Minute Walk   Phase Initial     Distance 1045 feet     Walk Time 6 minutes     # of Rest Breaks 0     MPH 1.98     METS 2.76     RPE 13     Perceived Dyspnea  2     VO2 Peak 9.67     Symptoms Yes (comment)     Comments SOB     Resting HR 87 bpm     Resting BP 126/64     Resting Oxygen Saturation  97 %     Exercise Oxygen Saturation  during 6 min walk 97 %     Max Ex. HR 132 bpm     Max Ex. BP 148/74     2 Minute Post BP 134/72              Oxygen Initial Assessment:   Oxygen Re-Evaluation:   Oxygen Discharge (Final Oxygen Re-Evaluation):   Initial Exercise Prescription:  Initial Exercise Prescription - 06/22/22 1200       Date of Initial Exercise RX and Referring Provider   Date 06/22/22    Referring Provider Lujean Amel MD      Oxygen   Maintain Oxygen Saturation 88% or higher      Treadmill   MPH 1.8    Grade 0.5    Minutes 15    METs 2.5      Recumbant Bike   Level 2    RPM 50    Watts 32    Minutes 15    METs 2.5      NuStep   Level 2    SPM 80    Minutes 15    METs 2.5      Track   Laps 26    Minutes 15    METs 2.41      Prescription Details   Frequency (times per week) 3    Duration Progress to 30 minutes of continuous aerobic without signs/symptoms of physical distress      Intensity   THRR 40-80% of Max Heartrate 115-144    Ratings of Perceived Exertion  11-13    Perceived Dyspnea 0-4      Progression   Progression Continue to progress workloads to maintain intensity without signs/symptoms of physical distress.      Resistance Training    Training Prescription Yes    Weight 4 lb    Reps 10-15             Perform Capillary Blood Glucose checks as needed.  Exercise Prescription Changes:   Exercise Prescription Changes     Row Name 06/22/22 1200 06/30/22 1400 07/13/22 1500 07/30/22 1400 08/10/22 1500     Response to Exercise   Blood Pressure (Admit) 126/64 90/56 124/64 106/64 124/60   Blood Pressure (Exercise) 148/74 124/76 162/60 146/74 --   Blood Pressure (Exit) 134/72 108/66 102/60 112/60 102/60   Heart Rate (Admit) 81 bpm 72 bpm 72 bpm 79 bpm 93 bpm   Heart Rate (Exercise) 132 bpm 148 bpm 122 bpm 98 bpm 105 bpm   Heart Rate (Exit) 96 bpm 125 bpm 98 bpm 60 bpm 75 bpm   Oxygen Saturation (Admit) 97 % -- -- -- --   Oxygen Saturation (Exercise) 97 % -- -- -- --   Rating of Perceived Exertion (Exercise) _0 Perceived Dyspnea (Exercise) 2 0 -- -- --   Symptoms SOB None none none none   Comments walk test results First 2 days of Rehab -- -- --   Duration -- Continue with 30 min of aerobic exercise without signs/symptoms of physical distress. Continue with 30 min of aerobic exercise without signs/symptoms of physical distress. Continue with 30 min of aerobic exercise without signs/symptoms of physical distress. Continue with 30 min of aerobic exercise without signs/symptoms of physical distress.   Intensity -- THRR unchanged THRR unchanged THRR unchanged THRR unchanged     Progression   Progression -- Continue to progress workloads to maintain intensity without signs/symptoms of physical distress. Continue to progress workloads to maintain intensity without signs/symptoms of physical distress. Continue to progress workloads to maintain intensity without signs/symptoms of physical distress. Continue to progress workloads to maintain intensity without signs/symptoms of physical distress.   Average METs -- 2.15 2.14 2.03 2.28     Resistance Training   Training Prescription -- Yes Yes Yes Yes   Weight -- 4 lb  4 lb 4 lb 5 lb   Reps -- 10-15 10-15 10-15 10-15     Interval Training   Interval Training -- No Yes No No   Equipment -- -- Recumbant Bike -- --   Comments -- -- level 2-8; light rpm -- --     Treadmill   MPH -- 1.8 1.1 1.1 1.4   Grade -- 0.5 1 0.5 1   Minutes -- _1 METs -- 2.5 2 1.92 2.26     Recumbant Bike   Level -- 2.1 2  intervals up to _2 Watts -- -- -- -- 38   Minutes -- _3 METs -- 1._4 2.86     NuStep   Level -- _5 --   Minutes -- _6 --   METs -- 2.2 2.2 2.3 --     REL-XR   Level -- -- -- -- 2   Minutes -- -- -- -- 15     Oxygen   Maintain Oxygen Saturation -- 88% or higher 88% or higher 88% or higher 88% or higher  Exercise Comments:   Exercise Goals and Review:   Exercise Goals     Row Name 06/22/22 1250             Exercise Goals   Increase Physical Activity Yes       Intervention Provide advice, education, support and counseling about physical activity/exercise needs.;Develop an individualized exercise prescription for aerobic and resistive training based on initial evaluation findings, risk stratification, comorbidities and participant's personal goals.       Expected Outcomes Short Term: Attend rehab on a regular basis to increase amount of physical activity.;Long Term: Add in home exercise to make exercise part of routine and to increase amount of physical activity.;Long Term: Exercising regularly at least 3-5 days a week.       Increase Strength and Stamina Yes       Intervention Provide advice, education, support and counseling about physical activity/exercise needs.;Develop an individualized exercise prescription for aerobic and resistive training based on initial evaluation findings, risk stratification, comorbidities and participant's personal goals.       Expected Outcomes Short Term: Increase workloads from initial exercise prescription for resistance, speed, and METs.;Short Term: Perform  resistance training exercises routinely during rehab and add in resistance training at home;Long Term: Improve cardiorespiratory fitness, muscular endurance and strength as measured by increased METs and functional capacity (6MWT)       Able to understand and use rate of perceived exertion (RPE) scale Yes       Intervention Provide education and explanation on how to use RPE scale       Expected Outcomes Short Term: Able to use RPE daily in rehab to express subjective intensity level;Long Term:  Able to use RPE to guide intensity level when exercising independently       Able to understand and use Dyspnea scale Yes       Intervention Provide education and explanation on how to use Dyspnea scale       Expected Outcomes Short Term: Able to use Dyspnea scale daily in rehab to express subjective sense of shortness of breath during exertion;Long Term: Able to use Dyspnea scale to guide intensity level when exercising independently       Knowledge and understanding of Target Heart Rate Range (THRR) Yes       Intervention Provide education and explanation of THRR including how the numbers were predicted and where they are located for reference       Expected Outcomes Short Term: Able to state/look up THRR;Short Term: Able to use daily as guideline for intensity in rehab;Long Term: Able to use THRR to govern intensity when exercising independently       Able to check pulse independently Yes       Intervention Provide education and demonstration on how to check pulse in carotid and radial arteries.;Review the importance of being able to check your own pulse for safety during independent exercise       Expected Outcomes Short Term: Able to explain why pulse checking is important during independent exercise;Long Term: Able to check pulse independently and accurately       Understanding of Exercise Prescription Yes       Intervention Provide education, explanation, and written materials on patient's individual  exercise prescription       Expected Outcomes Short Term: Able to explain program exercise prescription;Long Term: Able to explain home exercise prescription to exercise independently                Exercise  Goals Re-Evaluation :  Exercise Goals Re-Evaluation     Row Name 06/23/22 1012 06/30/22 0955 06/30/22 1502 07/13/22 1547 07/30/22 1437     Exercise Goal Re-Evaluation   Exercise Goals Review Increase Physical Activity;Able to understand and use rate of perceived exertion (RPE) scale;Knowledge and understanding of Target Heart Rate Range (THRR);Understanding of Exercise Prescription;Increase Strength and Stamina;Able to check pulse independently Increase Physical Activity;Able to understand and use rate of perceived exertion (RPE) scale;Knowledge and understanding of Target Heart Rate Range (THRR);Understanding of Exercise Prescription;Increase Strength and Stamina;Able to check pulse independently;Able to understand and use Dyspnea scale Increase Physical Activity;Increase Strength and Stamina;Understanding of Exercise Prescription Increase Physical Activity;Increase Strength and Stamina;Understanding of Exercise Prescription Increase Physical Activity;Increase Strength and Stamina;Understanding of Exercise Prescription   Comments Reviewed RPE scale, THR and program prescription with pt today.  Pt voiced understanding and was given a copy of goals to take home. Mason Frank is off to a good start in rehab.  He has not been doing much at home with his afib.  He is going to start walking and trying the staff videos at home. Reviewed home exercise with pt today.  Pt plans to walking and staff vidoes for exercise.  Reviewed THR, pulse, RPE, sign and symptoms, pulse oximetery and when to call 911 or MD.  Also discussed weather considerations and indoor options.  Pt voiced understanding. Mason Frank is off to a good start in rehab. He had an average MET level of 2.15 METs during his first two exercise sessions. He  also tolerated the treadmill at a speed of 1.8 mph and an incline of 0.5%. We will continue to monitor his progress in the program. Mason Frank continues to do well in rehab.  He started doing light intervals on the recumbent bike, varying levels 2-8. He also increased to level 4 on the T4 Nustep. His HR has been better controlled. We will continue to monitor. Mason Frank is doing well in rehab. He has consistently worked at an average of 2 METs. He also has continued to do well on the recumbent bike at level 3 and the T4 at level 4. Mason Frank could benefit from increasing his workload on the treadmill. We will continue to monitor his progress in the program.   Expected Outcomes Short: Use RPE daily to regulate intensity.  Long: Follow program prescription in THR. Short: Start to add in more exercise at home Long: Conitnue to build stamina Short: Continue to follow current exercise prescription. Long: Continue to increase strength and stamina. Short: Increase speed on treadmill Long: Continue to increase overall MET level Short: Increase speed on treadmill. Long: Continue to increase strength and stamina.    [place] Name 08/07/22 0925 08/10/22 1525           Exercise Goal Re-Evaluation   Exercise Goals Review Increase Physical Activity;Increase Strength and Stamina;Understanding of Exercise Prescription Increase Physical Activity;Increase Strength and Stamina;Understanding of Exercise Prescription      Comments Mason Frank is doing well in rehab.  He is still using his VR system for his exercise routines.  His wife does it too.  His strength is getting a little better, but his stamina is not wear he wants to be.  We talked about going for more walks to help with stamina.  He wore a holter for 24 hours and should get results next week. Mason Frank continues to do well in rehab. He did increase his watts on the recumbent bike which reached 38! He also increase his workload on the  treadmill to reach a 1.4 mph/ 1% incline. HR is getting up but  not always quite in his THR, though his RPEs are appropriate. Will continue to monitor.      Expected Outcomes Short: Continue to add in more walking Long: Continue to improve stamina Short: Continue to increase watts on recumbent bike Long: Continue to increase overall MET level               Discharge Exercise Prescription (Final Exercise Prescription Changes):  Exercise Prescription Changes - 08/10/22 1500       Response to Exercise   Blood Pressure (Admit) 124/60    Blood Pressure (Exit) 102/60    Heart Rate (Admit) 93 bpm    Heart Rate (Exercise) 105 bpm    Heart Rate (Exit) 75 bpm    Rating of Perceived Exertion (Exercise) 13    Symptoms none    Duration Continue with 30 min of aerobic exercise without signs/symptoms of physical distress.    Intensity THRR unchanged      Progression   Progression Continue to progress workloads to maintain intensity without signs/symptoms of physical distress.    Average METs 2.28      Resistance Training   Training Prescription Yes    Weight 5 lb    Reps 10-15      Interval Training   Interval Training No      Treadmill   MPH 1.4    Grade 1    Minutes 15    METs 2.26      Recumbant Bike   Level 4    Watts 38    Minutes 15    METs 2.86      REL-XR   Level 2    Minutes 15      Oxygen   Maintain Oxygen Saturation 88% or higher             Nutrition:  Target Goals: Understanding of nutrition guidelines, daily intake of sodium <1528m, cholesterol <2027m calories 30% from fat and 7% or less from saturated fats, daily to have 5 or more servings of fruits and vegetables.  Education: All About Nutrition: -Group instruction provided by verbal, written material, interactive activities, discussions, models, and posters to present general guidelines for heart healthy nutrition including fat, fiber, MyPlate, the role of sodium in heart healthy nutrition, utilization of the nutrition label, and utilization of this knowledge  for meal planning. Follow up email sent as well. Written material given at graduation. [facility]  Date 07/23/22  [facility] Code 1- Verbalizes Understanding       Biometrics:  Pre Biometrics - 06/22/22 1251       Pre Biometrics   Height 6' 3.6" (1.92 m)    Weight 304 lb 1.6 oz (137.9 kg)    Waist Circumference 45 inches    Hip Circumference 48 inches    Waist to Hip Ratio 0.94 %    BMI (Calculated) 37.42    Single Leg Stand 8.8 seconds              Nutrition Therapy Plan and Nutrition Goals:  Nutrition Therapy & Goals - 06/22/22 0912       Nutrition Therapy   Diet Heart healthy, low Na    Drug/Food Interactions Statins/Certain Fruits    Protein (specify units) 100g    Fiber 33 grams    Whole Grain Foods 3 servings  Saturated Fats 18 max. grams    Fruits and Vegetables 8 servings/day    Sodium 1.5 grams      Personal Nutrition Goals   Nutrition Goal ST: review paperwork, consider preparing more meals at home. LT: limit eating out <2-3x/week, include 5-8 servings of fruit/vegetables, increase varitey, limit salt <2g/day    Comments 61 y.o. M admitted to cardiac rehab for heart failure. PMHx inlcudes bilateral pulmonary embolism, cancer, cardiomyopathy, COPD, CAD, HFrEF, HTN, HLD, MI, sleep apnea, gout. Relevant medications includes colchicine, jardiance, crestor, torsemide, trazodone. PYP Score: 70. Vegetables & Fruits 7/12. Breads, Grains & Cereals 11/12. Red & Processed Meat 10/12. Poultry 2/2. Fish & Shellfish 0/4. Beans, Nuts & Seeds 1/4. Milk & Dairy Foods 5/6. Toppings, Oils, Seasonings & Salt 14/20. Sweets, Snacks & Restaurant Food 11/14. Beverages 9/10.    B: cereal (2% milk and raisin bran or kashi) with OJ L: [place] food out to eat (chicken soup) or PB and J sandwich (whole grain natures own) D: sometimes will not eat (will have [place] food) Drinks: water. Mason Frank reports  that he like salads as well sometimes during the week. Discussed general heart healthy eating and discussed considerations with eating out (whole grains are hard to find, limited variety, high fat red meat, usually vegetables are cooked with butter, high in sodium, etc). Suggested cooking more meals at home.      Intervention Plan   Intervention Prescribe, educate and counsel regarding individualized specific dietary modifications aiming towards targeted core components such as weight, hypertension, lipid management, diabetes, heart failure and other comorbidities.;Nutrition handout(s) given to patient.    Expected Outcomes Short Term Goal: Understand basic principles of dietary content, such as calories, fat, sodium, cholesterol and nutrients.;Short Term Goal: A plan has been developed with personal nutrition goals set during dietitian appointment.;Long Term Goal: Adherence to prescribed nutrition plan.             Nutrition Assessments:  MEDIFICTS Score Key: ?70 Need to make dietary changes  40-70 Heart Healthy Diet ? 40 Therapeutic Level Cholesterol Diet  Flowsheet Row Cardiac Rehab from 06/22/2022 in [facility] Cardiac and Pulmonary Rehab  Picture Your Plate Total Score on Admission 70      Picture Your Plate Scores: <38 Unhealthy dietary pattern with much room for improvement. 41-50 Dietary pattern unlikely to meet recommendations for good health and room for improvement. 51-60 More healthful dietary pattern, with some room for improvement.  >60 Healthy dietary pattern, although there may be some specific behaviors that could be improved.    Nutrition Goals Re-Evaluation:  Nutrition Goals Re-Evaluation     Row Name 06/30/22 0959 08/07/22 0932           Goals   Nutrition Goal ST: review paperwork, consider preparing more meals at home. LT: limit eating out <2-3x/week, include 5-8 servings of fruit/vegetables, increase varitey, limit salt <2g/day Short: Continue to add in more  fruits and vegetables. Long; Continue to add variety      Comment Mason Frank is doing well in rehab. He is trying to do better about making meals at home. He has not been going out as much.  He is trying to add in more fruit and vegetables, but not quite enough yet.  He is adding fruit to his cereal to help.  He is watching his sodium and doing better with it.  he is thinking about getting a juicer to help. Mason Frank is doing better with his diet.  He has cut back on his salt.  He is cooking more at home!!  He has had some not tasted so good.  We talked about trying some different Mrs. Dash seasoning.  He is trying to add more fruits in morning on cereal or cottage cheese.      Expected Outcome Short: Continue to add in more fruits and vegetables.  Long; Continue to add variety Short: Continue to add in more fruits and vegetables LOng: continue to add in more variety and enough protein.               Nutrition Goals Discharge (Final Nutrition Goals Re-Evaluation):  Nutrition Goals Re-Evaluation - 08/07/22 0932       Goals   Nutrition Goal Short: Continue to add in more fruits and vegetables. Long; Continue to add variety    Comment Mason Frank is doing better with his diet.  He has cut back on his salt.  He is cooking more at home!!  He has had some not tasted so good.  We talked about trying some different Mrs. Dash seasoning.  He is trying to add more fruits in morning on cereal or cottage cheese.    Expected Outcome Short: Continue to add in more fruits and vegetables LOng: continue to add in more variety and enough protein.             Psychosocial: Target Goals: Acknowledge presence or absence of significant depression and/or stress, maximize coping skills, provide positive support system. Participant is able to verbalize types and ability to use techniques and skills needed for reducing stress and depression.   Education: Stress, Anxiety, and Depression - Group verbal and visual presentation to  define topics covered.  Reviews how body is impacted by stress, anxiety, and depression.  Also discusses healthy ways to reduce stress and to treat/manage anxiety and depression.  Written material given at graduation.   Education: Sleep Hygiene -Provides group verbal and written instruction about how sleep can affect your health.  Define sleep hygiene, discuss sleep cycles and impact of sleep habits. Review good sleep hygiene tips.    Initial Review & Psychosocial Screening:  Initial Psych Review & Screening - 06/15/22 1012       Initial Review   Current issues with Current Stress Concerns    Source of Stress Concerns Occupation    Comments He has lost his job recently and is doing ok with being out of work. He is ready to get his health back in order so he can work a less demanding job. He does not take any medicaions for his mood.      Family Dynamics   Good Support System? Yes    Comments He can look to his wife for support, sisters and brother. His two daughters are nearby also.      Barriers   Psychosocial barriers to participate in program The patient should benefit from training in stress management and relaxation.;There are no identifiable barriers or psychosocial needs.      Screening Interventions   Interventions Encouraged to exercise;To provide support and resources with identified psychosocial needs;Provide feedback about the scores to participant    Expected Outcomes Short Term goal: Utilizing psychosocial counselor, staff and physician to assist with identification of specific Stressors or current issues interfering with healing process. Setting desired goal for each stressor or current issue identified.;Long Term Goal: Stressors or current issues are controlled or eliminated.;Short Term goal: Identification and review with participant of any Quality of Life or Depression concerns found by scoring the questionnaire.;Long  Term goal: The participant improves quality of Life and  PHQ9 Scores as seen by post scores and/or verbalization of changes             Quality of Life Scores:   Quality of Life - 06/22/22 1251       Quality of Life   Select Quality of Life      Quality of Life Scores   Health/Function Pre 20.07 %    Socioeconomic Pre 21.5 %    Psych/Spiritual Pre 22.86 %    Family Pre 24 %    GLOBAL Pre 21.51 %            Scores of 19 and below usually indicate a poorer quality of life in these areas.  A difference of  2-3 points is a clinically meaningful difference.  A difference of 2-3 points in the total score of the Quality of Life Index has been associated with significant improvement in overall quality of life, self-image, physical symptoms, and general health in studies assessing change in quality of life.  PHQ-9: Review Flowsheet       06/22/2022 06/15/2019 04/26/2019  Depression screen PHQ 2/9  Decreased Interest 0 0 1  Down, Depressed, Hopeless 0 0 0  PHQ - 2 Score 0 0 1  Altered sleeping 1 0 0  Tired, decreased energy _0 Change in appetite 1 1 0  Feeling bad or failure about yourself  0 0 0  Trouble concentrating 0 0 0  Moving slowly or fidgety/restless 0 0 0  Suicidal thoughts 0 0 0  PHQ-9 Score _1 Difficult doing work/chores Not difficult at all Not difficult at all Not difficult at all   Interpretation of Total Score  Total Score Depression Severity:  1-4 = Minimal depression, 5-9 = Mild depression, 10-14 = Moderate depression, 15-19 = Moderately severe depression, 20-27 = Severe depression   Psychosocial Evaluation and Intervention:  Psychosocial Evaluation - 06/15/22 1015       Psychosocial Evaluation & Interventions   Interventions Encouraged to exercise with the program and follow exercise prescription;Relaxation education;Stress management education    Comments He has lost his job recently and is doing ok with being out of work. He is ready to get his health back in order so he can work a less demanding  job. He does not take any medicaions for his mood.    Expected Outcomes Short: Start [facility] to help with mood. Long: Maintain a healthy mental state    Continue Psychosocial Services  Follow up required by staff             Psychosocial Re-Evaluation:  Psychosocial Re-Evaluation     Row Name 06/30/22 0956 08/07/22 0930           Psychosocial Re-Evaluation   Current issues with Current Stress Concerns Current Stress Concerns      Comments Mason Frank is doing well in rehab.  He denies any major stressors currently.  He is currently out of work, but finances have been okay.  His wife is worried, but he feels good.  He is sleeping fairly well when he is able to sleep.  He has trouble getting to sleep.  He has trazadone to help as melatonin did not help.  We talked about taking the right does for him. Mason Frank is doing well in rehab.  He loss a friend yesterday and is coping with his loss.  He is sleeping a little better.  His health  is his biggest stressors.  His dog continues to bring him joy and laughter every day.      Expected Outcomes Shrot: Continue to work on sleep Long; Conitnue to exercise for mental boost Short: Continue to work on sleep Long: Continue to exercise for mental boost      Interventions Encouraged to attend Cardiac Rehabilitation for the exercise Encouraged to attend Cardiac Rehabilitation for the exercise      Continue Psychosocial Services  -- Follow up required by staff               Psychosocial Discharge (Final Psychosocial Re-Evaluation):  Psychosocial Re-Evaluation - 08/07/22 0930       Psychosocial Re-Evaluation   Current issues with Current Stress Concerns    Comments Mason Frank is doing well in rehab.  He loss a friend yesterday and is coping with his loss.  He is sleeping a little better.  His health is his biggest stressors.  His dog continues to bring him joy and laughter every day.    Expected Outcomes Short: Continue to work on sleep Long: Continue to  exercise for mental boost    Interventions Encouraged to attend Cardiac Rehabilitation for the exercise    Continue Psychosocial Services  Follow up required by staff             Vocational Rehabilitation: Provide vocational rehab assistance to qualifying candidates.   Vocational Rehab Evaluation & Intervention:  Vocational Rehab - 06/22/22 1252       Initial Vocational Rehab Evaluation & Intervention   Assessment shows need for Vocational Rehabilitation No             Education: Education Goals: Education classes will be provided on a variety of topics geared toward better understanding of heart health and risk factor modification. Participant will state understanding/return demonstration of topics presented as noted by education test scores.  Learning Barriers/Preferences:  Learning Barriers/Preferences - 06/15/22 1012       Learning Barriers/Preferences   Learning Barriers None    Learning Preferences None             General Cardiac Education Topics:  AED/CPR: - Group verbal and written instruction with the use of models to demonstrate the basic use of the AED with the basic ABC's of resuscitation.   Anatomy and Cardiac Procedures: - Group verbal and visual presentation and models provide information about basic cardiac anatomy and function. Reviews the testing methods done to diagnose heart disease and the outcomes of the test results. Describes the treatment choices: Medical Management, Angioplasty, or Coronary Bypass Surgery for treating various heart conditions including Myocardial Infarction, Angina, Valve Disease, and Cardiac Arrhythmias.  Written material given at graduation. Flowsheet Row Cardiac Rehab from 07/30/2022 in [facility] Cardiac and Pulmonary Rehab  Education need identified 06/22/22  Date 07/30/22  Educator SB  Instruction Review Code 1- Verbalizes Understanding       Medication Safety: - Group verbal and visual instruction to review  commonly prescribed medications for heart and lung disease. Reviews the medication, class of the drug, and side effects. Includes the steps to properly store meds and maintain the prescription regimen.  Written material given at graduation.   Intimacy: - Group verbal instruction through game format to discuss how heart and lung disease can affect sexual intimacy. Written material given at graduation..   Know Your Numbers and Heart Failure: - Group verbal and visual instruction to discuss disease risk factors for cardiac and pulmonary disease and treatment options.  Reviews associated critical values for Overweight/Obesity, Hypertension, Cholesterol, and Diabetes.  Discusses basics of heart failure: signs/symptoms and treatments.  Introduces Heart Failure Zone chart for action plan for heart failure.  Written material given at graduation.   Infection Prevention: - Provides verbal and written material to individual with discussion of infection control including proper hand washing and proper equipment cleaning during exercise session. Flowsheet Row Cardiac Rehab from 07/30/2022 in [facility] Cardiac and Pulmonary Rehab  Date 06/15/22  Educator [facility]  Instruction Review Code 1- Verbalizes Understanding       Falls Prevention: - Provides verbal and written material to individual with discussion of falls prevention and safety. Flowsheet Row Cardiac Rehab from 07/30/2022 in [facility] Cardiac and Pulmonary Rehab  Date 06/15/22  Educator [facility]  Instruction Review Code 1- Verbalizes Understanding       Other: -Provides group and verbal instruction on various topics (see comments)   Knowledge Questionnaire Score:  Knowledge Questionnaire Score - 06/22/22 1252       Knowledge Questionnaire Score   Pre Score 24/26             Core Components/Risk Factors/Patient Goals at Admission:  Personal Goals and Risk Factors at Admission - 06/22/22 1253       Core Components/Risk Factors/Patient Goals on  Admission    Weight Management Yes;Weight Loss;Obesity    Intervention Weight Management/Obesity: Establish reasonable short term and long term weight goals.;Weight Management: Develop a combined nutrition and exercise program designed to reach desired caloric intake, while maintaining appropriate intake of nutrient and fiber, sodium and fats, and appropriate energy expenditure required for the weight goal.;Weight Management: Provide education and appropriate resources to help participant work on and attain dietary goals.;Obesity: Provide education and appropriate resources to help participant work on and attain dietary goals.    Admit Weight 304 lb 1.6 oz (137.9 kg)    Goal Weight: Short Term 299 lb (135.6 kg)    Goal Weight: Long Term 269 lb (122 kg)    Expected Outcomes Short Term: Continue to assess and modify interventions until short term weight is achieved;Long Term: Adherence to nutrition and physical activity/exercise program aimed toward attainment of established weight goal;Weight Loss: Understanding of general recommendations for a balanced deficit meal plan, which promotes 1-2 lb weight loss per week and includes a negative energy balance of [phone] kcal/d;Understanding recommendations for meals to include 15-35% energy as protein, 25-35% energy from fat, 35-60% energy from carbohydrates, less than 262m of dietary cholesterol, 20-35 gm of total fiber daily;Understanding of distribution of calorie intake throughout the day with the consumption of 4-5 meals/snacks    Heart Failure Yes    Intervention Provide a combined exercise and nutrition program that is supplemented with education, support and counseling about heart failure. Directed toward relieving symptoms such as shortness of breath, decreased exercise tolerance, and extremity edema.    Expected Outcomes Improve functional capacity of life;Short term: Attendance in program 2-3 days a week with increased exercise capacity. Reported lower  sodium intake. Reported increased fruit and vegetable intake. Reports medication compliance.;Short term: Daily weights obtained and reported for increase. Utilizing diuretic protocols set by physician.;Long term: Adoption of self-care skills and reduction of barriers for early signs and symptoms recognition and intervention leading to self-care maintenance.    Hypertension Yes    Intervention Provide education on lifestyle modifcations including regular physical activity/exercise, weight management, moderate sodium restriction and increased consumption of fresh fruit, vegetables, and low fat dairy, alcohol moderation, and smoking cessation.;Monitor prescription  use compliance.    Expected Outcomes Short Term: Continued assessment and intervention until BP is < 140/67m HG in hypertensive participants. < 130/822mHG in hypertensive participants with diabetes, heart failure or chronic kidney disease.;Long Term: Maintenance of blood pressure at goal levels.    Lipids Yes    Intervention Provide education and support for participant on nutrition & aerobic/resistive exercise along with prescribed medications to achieve LDL <7034mHDL >50m7m  Expected Outcomes Short Term: Participant states understanding of desired cholesterol values and is compliant with medications prescribed. Participant is following exercise prescription and nutrition guidelines.;Long Term: Cholesterol controlled with medications as prescribed, with individualized exercise RX and with personalized nutrition plan. Value goals: LDL < 70mg31mL > 40 mg.             Education:Diabetes - Individual verbal and written instruction to review signs/symptoms of diabetes, desired ranges of glucose level fasting, after meals and with exercise. Acknowledge that pre and post exercise glucose checks will be done for 3 sessions at entry of program.   Core Components/Risk Factors/Patient Goals Review:   Goals and Risk Factor Review     Row Name  06/30/22 1001 08/07/22 0928           Core Components/Risk Factors/Patient Goals Review   Personal Goals Review Weight Management/Obesity;Lipids;Hypertension;Heart Failure Weight Management/Obesity;Lipids;Hypertension;Heart Failure      Review [name] Mason Machooing well in rehab. His weight is trending down. He can almost see his feet again. His pressures have been running low in rehab. He is not checking them at home so we talked about getting back to the habit of checking again.  He has not had any heart failure symptoms other than fatigue and SOB with activity, but that is why he is back in rehab.  [name] Mason Machooing to add in mroe exercise at home. [name] wore a heart monitor this week and will get results next week.  His pressures are doing well in class.  He has not had any heart failure sysmptoms.  He continues to work on weight loss but it has been stable.      Expected Outcomes Short: Continue to work on weight loss and adding variety Long; Conitnue to monitor risk factors Short: Continue to work on weight loss and get results of heart monitor Long: Continue to monitor risk factors               Core Components/Risk Factors/Patient Goals at Discharge (Final Review):   Goals and Risk Factor Review - 08/07/22 0928       Core Components/Risk Factors/Patient Goals Review   Personal Goals Review Weight Management/Obesity;Lipids;Hypertension;Heart Failure    Review [name] wore a heart monitor this week and will get results next week.  His pressures are doing well in class.  He has not had any heart failure sysmptoms.  He continues to work on weight loss but it has been stable.    Expected Outcomes Short: Continue to work on weight loss and get results of heart monitor Long: Continue to monitor risk factors             ITP Comments:  ITP Comments     Row Name 06/15/22 1010 06/22/22 1242 06/23/22 1012 06/24/22 1031 07/22/22 1055   ITP Comments Virtual Visit completed. Patient informed on EP and RD  appointment and 6 Minute walk test. Patient also informed of patient health questionnaires on My Chart. Patient Verbalizes understanding. Visit diagnosis can be found in [facility]6/2023. Completed  6MWT and gym orientation. Initial ITP created and sent for review to Dr. Emily Filbert, Medical Director. First full day of exercise!  Patient was oriented to gym and equipment including functions, settings, policies, and procedures.  Patient's individual exercise prescription and treatment plan were reviewed.  All starting workloads were established based on the results of the 6 minute walk test done at initial orientation visit.  The plan for exercise progression was also introduced and progression will be customized based on patient's performance and goals. 30 Day review completed. Medical Director ITP review done, changes made as directed, and signed approval by Medical Director.   NEW TO PROGRAM 30 Day review completed. Medical Director ITP review done, changes made as directed, and signed approval by Medical Director.    [place] Name 08/19/22 1122           ITP Comments 30 Day review completed. Medical Director ITP review done, changes made as directed, and signed approval by Medical Director.                Comments:

## 2022-08-20 ENCOUNTER — Encounter: Payer: BC Managed Care – PPO | Admitting: *Deleted

## 2022-08-20 DIAGNOSIS — I5042 Chronic combined systolic (congestive) and diastolic (congestive) heart failure: Secondary | ICD-10-CM | POA: Diagnosis not present

## 2022-08-20 NOTE — Progress Notes (Signed)
Daily Session Note  Patient Details  Name: Mason Frank MRN: [id number] Date of Birth: 1961-08-22 Referring Provider:   Flowsheet Row Cardiac Rehab from 06/22/2022 in [facility] Cardiac and Pulmonary Rehab  Referring Provider Lujean Amel MD       Encounter Date: 08/20/2022  Check In:  Session Check In - 08/20/22 0930       Check-In   Supervising physician immediately available to respond to emergencies See telemetry face sheet for immediately available ER MD    Location [facility]-Cardiac & Pulmonary Rehab    Staff Present Darlyne Russian, RN, ADN;Laureen Owens Shark, BS, RRT, CPFT;Kara Maricela Bo, MS, ACSM CEP, Exercise Physiologist    Virtual Visit No    Medication changes reported     Yes    Comments metoprolol cut in half    Fall or balance concerns reported    No    Warm-up and Cool-down Performed on first and last piece of equipment    Resistance Training Performed Yes    VAD Patient? No    PAD/SET Patient? No      Pain Assessment   Currently in Pain? No/denies                Social History   Tobacco Use  Smoking Status Former   Packs/day: 1.00   Years: 45.00   Total pack years: 45.00   Types: Cigarettes   Quit date: 07/24/2021   Years since quitting: 1.0  Smokeless Tobacco Never    Goals Met:  Independence with exercise equipment Exercise tolerated well No report of concerns or symptoms today Strength training completed today  Goals Unmet:  Not Applicable  Comments: Pt able to follow exercise prescription today without complaint.  Will continue to monitor for progression.    Dr. Emily Filbert is Medical Director for [place].  Dr. Ottie Glazier is Medical Director for [facility] Pulmonary Rehabilitation.

## 2022-08-25 ENCOUNTER — Encounter: Payer: BC Managed Care – PPO | Attending: Internal Medicine | Admitting: *Deleted

## 2022-08-25 DIAGNOSIS — Z5189 Encounter for other specified aftercare: Secondary | ICD-10-CM | POA: Insufficient documentation

## 2022-08-25 DIAGNOSIS — I5042 Chronic combined systolic (congestive) and diastolic (congestive) heart failure: Secondary | ICD-10-CM | POA: Insufficient documentation

## 2022-08-25 NOTE — Progress Notes (Signed)
Daily Session Note  Patient Details  Name: Mason Frank MRN: [id number] Date of Birth: [birth year] Referring Provider:   Flowsheet Row Cardiac Rehab from 06/22/2022 in [facility] Cardiac and Pulmonary Rehab  Referring Provider Lujean Amel MD       Encounter Date: 08/25/2022  Check In:  Session Check In - 08/25/22 1017       Check-In   Supervising physician immediately available to respond to emergencies See telemetry face sheet for immediately available ER MD    Location [facility]-Cardiac & Pulmonary Rehab    Staff Present Heath Lark, RN, BSN, Kathaleen Maser, MS, ACSM CEP, Exercise Physiologist;Noah Tickle, BS, Exercise Physiologist    Virtual Visit No    Medication changes reported     No    Fall or balance concerns reported    No    Warm-up and Cool-down Performed on first and last piece of equipment    Resistance Training Performed Yes    VAD Patient? No    PAD/SET Patient? No      Pain Assessment   Currently in Pain? No/denies                Social History   Tobacco Use  Smoking Status Former   Packs/day: 1.00   Years: 45.00   Total pack years: 45.00   Types: Cigarettes   Quit date: 07/24/2021   Years since quitting: 1.0  Smokeless Tobacco Never    Goals Met:  Independence with exercise equipment Exercise tolerated well No report of concerns or symptoms today  Goals Unmet:  Not Applicable  Comments: Pt able to follow exercise prescription today without complaint.  Will continue to monitor for progression.    Dr. Emily Filbert is Medical Director for [place].  Dr. Ottie Glazier is Medical Director for [facility] Pulmonary Rehabilitation.

## 2022-08-26 ENCOUNTER — Ambulatory Visit: Payer: BC Managed Care – PPO | Admitting: Dermatology

## 2022-08-27 ENCOUNTER — Encounter: Payer: BC Managed Care – PPO | Admitting: *Deleted

## 2022-08-27 DIAGNOSIS — I5042 Chronic combined systolic (congestive) and diastolic (congestive) heart failure: Secondary | ICD-10-CM | POA: Diagnosis not present

## 2022-08-27 NOTE — Progress Notes (Signed)
Daily Session Note  Patient Details  Name: Kaoru F Miera MRN: [id number] Date of Birth: 08/16/1961 Referring Provider:   Flowsheet Row Cardiac Rehab from 06/22/2022 in [facility]  Referring Provider Callwood, Dwayne MD       Encounter Date: 08/27/2022  Check In:  Session Check In - 08/27/22 1001       Check-In   Supervising physician immediately available to respond to emergencies See telemetry face sheet for immediately available ER MD    Location [facility]    Staff Present Megan Smith, RN, ADN;Kara Williamson, MS, ACSM CEP, Exercise Physiologist;Joseph Hood, RCP,RRT,BSRT    Virtual Visit No    Medication changes reported     No    Fall or balance concerns reported    No    Warm-up and Cool-down Performed on first and last piece of equipment    Resistance Training Performed Yes    VAD Patient? No    PAD/SET Patient? No      Pain Assessment   Currently in Pain? No/denies                Social History   Tobacco Use  Smoking Status Former   Packs/day: 1.00   Years: 45.00   Total pack years: 45.00   Types: Cigarettes   Quit date: 07/24/2021   Years since quitting: 1.0  Smokeless Tobacco Never    Goals Met:  Independence with exercise equipment Exercise tolerated well No report of concerns or symptoms today Strength training completed today  Goals Unmet:  Not Applicable  Comments: Pt able to follow exercise prescription today without complaint.  Will continue to monitor for progression.    Dr. Mark Miller is Medical Director for [facility].  Dr. Fuad Aleskerov is Medical Director for [facility]. 

## 2022-08-31 ENCOUNTER — Ambulatory Visit (INDEPENDENT_AMBULATORY_CARE_PROVIDER_SITE_OTHER): Payer: BC Managed Care – PPO | Admitting: Dermatology

## 2022-08-31 ENCOUNTER — Encounter: Payer: Self-pay | Admitting: Dermatology

## 2022-08-31 VITALS — BP 142/75 | HR 85

## 2022-08-31 DIAGNOSIS — Z79899 Other long term (current) drug therapy: Secondary | ICD-10-CM

## 2022-08-31 DIAGNOSIS — L732 Hidradenitis suppurativa: Secondary | ICD-10-CM | POA: Diagnosis not present

## 2022-08-31 DIAGNOSIS — L719 Rosacea, unspecified: Secondary | ICD-10-CM

## 2022-08-31 DIAGNOSIS — L711 Rhinophyma: Secondary | ICD-10-CM

## 2022-08-31 DIAGNOSIS — L821 Other seborrheic keratosis: Secondary | ICD-10-CM

## 2022-08-31 DIAGNOSIS — L820 Inflamed seborrheic keratosis: Secondary | ICD-10-CM | POA: Diagnosis not present

## 2022-08-31 DIAGNOSIS — L578 Other skin changes due to chronic exposure to nonionizing radiation: Secondary | ICD-10-CM

## 2022-08-31 MED ORDER — DOXYCYCLINE HYCLATE 50 MG PO CAPS: 50.0000 mg | ORAL_CAPSULE | Freq: Every day | ORAL | 1 refills | Status: DC

## 2022-08-31 NOTE — Patient Instructions (Addendum)
Continue doxycycline 50 mg capsule by mouth daily with evening meal   Doxycycline should be taken with food to prevent nausea. Do not lay down for 30 minutes after taking. Be cautious with sun exposure and use good sun protection while on this medication. Pregnant women should not take this medication.   Continue skin medicinals  cream    Skin Medicinals metronidazole/ivermectin/azelaic acid twice daily as needed to affected areas on the face. The patient was advised this is not covered by insurance since it is made by a compounding pharmacy. They will receive an email to check out and the medication will be mailed to their home.        Cryotherapy Aftercare  Wash gently with soap and water everyday.   Apply Vaseline and Band-Aid daily until healed.     Seborrheic Keratosis  What causes seborrheic keratoses? Seborrheic keratoses are harmless, common skin growths that first appear during adult life.  As time goes by, more growths appear.  Some people may develop a large number of them.  Seborrheic keratoses appear on both covered and uncovered body parts.  They are not caused by sunlight.  The tendency to develop seborrheic keratoses can be inherited.  They vary in color from skin-colored to gray, brown, or even black.  They can be either smooth or have a rough, warty surface.   Seborrheic keratoses are superficial and look as if they were stuck on the skin.  Under the microscope this type of keratosis looks like layers upon layers of skin.  That is why at times the top layer may seem to fall off, but the rest of the growth remains and re-grows.    Treatment Seborrheic keratoses do not need to be treated, but can easily be removed in the office.  Seborrheic keratoses often cause symptoms when they rub on clothing or jewelry.  Lesions can be in the way of shaving.  If they become inflamed, they can cause itching, soreness, or burning.  Removal of a seborrheic keratosis can be accomplished  by freezing, burning, or surgery. If any spot bleeds, scabs, or grows rapidly, please return to have it checked, as these can be an indication of a skin cancer.     Due to recent changes in healthcare laws, you may see results of your pathology and/or laboratory studies on MyChart before the doctors have had a chance to review them. We understand that in some cases there may be results that are confusing or concerning to you. Please understand that not all results are received at the same time and often the doctors may need to interpret multiple results in order to provide you with the best plan of care or course of treatment. Therefore, we ask that you please give [place] 2 business days to thoroughly review all your results before contacting the office for clarification. Should we see a critical lab result, you will be contacted sooner.   If You Need Anything After Your Visit  If you have any questions or concerns for your doctor, please call our main line at [phone] and press option 4 to reach your doctor's medical assistant. If no one answers, please leave a voicemail as directed and we will return your call as soon as possible. Messages left after 4 pm will be answered the following business day.   You may also send [place] a message via [place]. We typically respond to MyChart messages within 1-2 business days.  For prescription refills, please ask your pharmacy to  contact our office. Our fax number is [phone].  If you have an urgent issue when the clinic is closed that cannot wait until the next business day, you can page your doctor at the number below.    Please note that while we do our best to be available for urgent issues outside of office hours, we are not available 24/7.   If you have an urgent issue and are unable to reach [place], you may choose to seek medical care at your doctor's office, retail clinic, urgent care center, or emergency room.  If you have a medical emergency,  please immediately call 911 or go to the emergency department.  Pager Numbers  - Dr. Nehemiah Massed: [phone]  - Dr. Laurence Ferrari: [phone]  - Dr. Nicole Kindred: [phone]  In the event of inclement weather, please call our main line at [phone] for an update on the status of any delays or closures.  Dermatology Medication Tips: Please keep the boxes that topical medications come in in order to help keep track of the instructions about where and how to use these. Pharmacies typically print the medication instructions only on the boxes and not directly on the medication tubes.   If your medication is too expensive, please contact our office at [phone] option 4 or send [place] a message through [place].   We are unable to tell what your co-pay for medications will be in advance as this is different depending on your insurance coverage. However, we may be able to find a substitute medication at lower cost or fill out paperwork to get insurance to cover a needed medication.   If a prior authorization is required to get your medication covered by your insurance company, please allow [place] 1-2 business days to complete this process.  Drug prices often vary depending on where the prescription is filled and some pharmacies may offer cheaper prices.  The website www.goodrx.com contains coupons for medications through different pharmacies. The prices here do not account for what the cost may be with help from insurance (it may be cheaper with your insurance), but the website can give you the price if you did not use any insurance.  - You can print the associated coupon and take it with your prescription to the pharmacy.  - You may also stop by our office during regular business hours and pick up a GoodRx coupon card.  - If you need your prescription sent electronically to a different pharmacy, notify our office through [place] or by phone at [phone] option 4.     Si Usted Necesita Algo  Despus de Su Visita  Tambin puede enviarnos un mensaje a travs de Pharmacist, community. Por lo general respondemos a los mensajes de MyChart en el transcurso de 1 a 2 das hbiles.  Para renovar recetas, por favor pida a su farmacia que se ponga en contacto con nuestra oficina. Harland Dingwall de fax es [place] [phone].  Si tiene un asunto urgente cuando la clnica est cerrada y que no puede esperar hasta el siguiente da hbil, puede llamar/localizar a su doctor(a) al nmero que aparece a continuacin.   Por favor, tenga en cuenta que aunque hacemos todo lo posible para estar disponibles para asuntos urgentes fuera del horario de [place], no estamos disponibles las 24 horas del da, los 7 das de la [place].   Si tiene un problema urgente y no puede comunicarse con nosotros, puede optar por buscar atencin mdica  en el consultorio de su doctor(a), en una clnica  privada, en un centro de atencin urgente o en una sala de emergencias.  Si tiene Engineering geologist, por favor llame inmediatamente al 911 o vaya a la sala de emergencias.  Nmeros de bper  - Dr. Nehemiah Massed: [phone]  - Dra. Moye: [phone]  - Dra. Nicole Kindred: [phone]  En caso de inclemencias del [place], por favor llame a Johnsie Kindred principal al [phone] para una actualizacin sobre el [place] de cualquier retraso o cierre.  Consejos para la medicacin en dermatologa: Por favor, guarde las cajas en las que vienen los medicamentos de uso tpico para ayudarle a seguir las instrucciones sobre dnde y cmo usarlos. Las farmacias generalmente imprimen las instrucciones del medicamento slo en las cajas y no directamente en los tubos del [place].   Si su medicamento es muy caro, por favor, pngase en contacto con Zigmund Daniel llamando al [phone] y presione la opcin 4 o envenos un mensaje a travs de Pharmacist, community.   No podemos decirle cul ser su copago por los medicamentos por adelantado ya que esto es diferente  dependiendo de la cobertura de su seguro. Sin embargo, es posible que podamos encontrar un medicamento sustituto a Electrical engineer un formulario para que el seguro cubra el medicamento que se considera necesario.   Si se requiere una autorizacin previa para que su compaa de seguros [place] su medicamento, por favor permtanos de 1 a 2 das hbiles para completar este proceso.  Los precios de los medicamentos varan con frecuencia dependiendo del Environmental consultant de dnde se surte la receta y alguna farmacias pueden ofrecer precios ms baratos.  El sitio web www.goodrx.com tiene cupones para medicamentos de Airline pilot. Los precios aqu no tienen en cuenta lo que podra costar con la ayuda del seguro (puede ser ms barato con su seguro), pero el sitio web puede darle el precio si no utiliz Research scientist (physical sciences).  - Puede imprimir el cupn correspondiente y llevarlo con su receta a la farmacia.  - Tambin puede pasar por nuestra oficina durante el horario de atencin regular y Charity fundraiser una tarjeta de cupones de GoodRx.  - Si necesita que su receta se enve electrnicamente a una farmacia diferente, informe a nuestra oficina a travs de MyChart de [place] o por telfono llamando al [phone] y presione la opcin 4.

## 2022-08-31 NOTE — Progress Notes (Signed)
Follow-Up Visit   Subjective  Mason Frank is a 62 y.o. male who presents for the following: Rosacea (Hx of rosacea with rhinophyma. Patient prescribed skin medicinals triple cream and doxycycline 50 mg tab to take daily. Patient reports has been using cream but stopped taking tablets. 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 Prescibed doxycycline 50 mg po qd. Patient reports stopped taking medication.), and Skin Problem (Patient reports some itchy sore areas on back he would like treated. ). The patient has spots, moles and lesions to be evaluated, some may be new or changing and the patient has concerns that these could be cancer.  The following portions of the chart were reviewed this encounter and updated as appropriate:  Tobacco  Allergies  Meds  Problems  Med Hx  Surg Hx  Fam Hx     Review of Systems: No other skin or systemic complaints except as noted in HPI or Assessment and Plan.  Objective  Well appearing patient in no apparent distress; mood and affect are within normal limits.  A focused examination was performed including back, face, pubic area. Relevant physical exam findings are noted in the Assessment and Plan.  face and nose Mid face erythema with telangiectasias +/- scattered inflammatory papules.   Chest, shoulders, back x 18 (18) Erythematous stuck-on, waxy papule or plaque   Assessment & Plan  Rosacea face and nose With Rhinophyma -   Rosacea is a chronic progressive skin condition usually affecting the face of adults, causing redness and/or acne bumps. It is treatable but not curable. It sometimes affects the eyes (ocular rosacea) as well. It may respond to topical and/or systemic medication and can flare with stress, sun exposure, alcohol, exercise and some foods.  Daily application of broad spectrum spf 30+ sunscreen to face is recommended to reduce flares.   Continue Skin Medicinals Triple Rosacea cream QD to the entire face and not just the  nose.  Restart doxycycline '50mg'$  po QD #90 1RF.    Doxycycline should be taken with food to prevent nausea. Do not lay down for 30 minutes after taking. Be cautious with sun exposure and use good sun protection while on this medication. Pregnant women should not take this medication.   Hidradenitis suppurativa supra pubic With flare of a nodule of the pubic area. Supra pubic Hidradenitis Suppurativa is a chronic; persistent; non-curable, but treatable condition due to abnormal inflamed sweat glands in the body folds (axilla, inframammary, groin, medial thighs), causing recurrent painful draining cysts and scarring. It can be associated with severe scarring acne and cysts; also abscesses and scarring of scalp. The goal is control and prevention of flares, as it is not curable. Scars are permanent and can be thickened. Treatment may include daily use of topical medication and oral antibiotics.  Oral isotretinoin may also be helpful.  For more severe cases, Humira (a biologic injection) may be prescribed to decrease the inflammatory process and prevent flares.  When indicated, inflamed cysts may also be treated surgically.   Start Doxycycline '50mg'$  po QD with food #90 1RF.    Doxycycline should be taken with food to prevent nausea. Do not lay down for 30 minutes after taking. Be cautious with sun exposure and use good sun protection while on this medication. Pregnant women should not take this medication.   doxycycline (VIBRAMYCIN) 50 MG capsule - supra pubic Take 1 capsule (50 mg total) by mouth daily. Take with food. (Evening meal)  Inflamed seborrheic keratosis (18) Chest, shoulders,  back x 18 Symptomatic, irritating, patient would like treated. Destruction of lesion - Chest, shoulders, back x 18 Complexity: simple   Destruction method: cryotherapy   Informed consent: discussed and consent obtained   Timeout:  patient name, date of birth, surgical site, and procedure verified Lesion destroyed  using liquid nitrogen: Yes   Region frozen until ice ball extended beyond lesion: Yes   Outcome: patient tolerated procedure well with no complications   Post-procedure details: wound care instructions given    Seborrheic Keratoses - Stuck-on, waxy, tan-brown papules and/or plaques  - Benign-appearing - Discussed benign etiology and prognosis. - Observe - Call for any changes  Actinic Damage - chronic, secondary to cumulative UV radiation exposure/sun exposure over time - diffuse scaly erythematous macules with underlying dyspigmentation - Recommend daily broad spectrum sunscreen SPF 30+ to sun-exposed areas, reapply every 2 hours as needed.  - Recommend staying in the shade or wearing long sleeves, sun glasses (UVA+UVB protection) and wide brim hats (4-inch brim around the entire circumference of the hat). - Call for new or changing lesions.  Return in about 6 months (around 03/01/2023) for follow up rosacea and hs . IRuthell Rummage, CMA, am acting as scribe for Sarina Ser, MD. Documentation: I have reviewed the above documentation for accuracy and completeness, and I agree with the above.  Sarina Ser, MD

## 2022-09-01 ENCOUNTER — Encounter: Payer: BC Managed Care – PPO | Admitting: *Deleted

## 2022-09-01 DIAGNOSIS — I5042 Chronic combined systolic (congestive) and diastolic (congestive) heart failure: Secondary | ICD-10-CM | POA: Diagnosis not present

## 2022-09-01 NOTE — Progress Notes (Signed)
Daily Session Note  Patient Details  Name: Mason Frank MRN: [id number] Date of Birth: [birth year] Referring Provider:   Flowsheet Row Cardiac Rehab from 06/22/2022 in [facility]  Referring Provider Lujean Amel MD       Encounter Date: 09/01/2022  Check In:  Session Check In - 09/01/22 1014       Check-In   Supervising physician immediately available to respond to emergencies See telemetry face sheet for immediately available ER MD    Location [facility]    Staff Present Heath Lark, RN, BSN, CCRP;Jessica [place], MA, [place], CCRP, Bertram Gala, MS, ACSM CEP, Exercise Physiologist;Meredith Sherryll Burger, RN BSN    Virtual Visit No    Medication changes reported     No    Fall or balance concerns reported    No    Warm-up and Cool-down Performed on first and last piece of equipment    Resistance Training Performed Yes    VAD Patient? No    PAD/SET Patient? No      Pain Assessment   Currently in Pain? No/denies                Social History   Tobacco Use  Smoking Status Former   Packs/day: 1.00   Years: 45.00   Total pack years: 45.00   Types: Cigarettes   Quit date: 07/24/2021   Years since quitting: 1.1  Smokeless Tobacco Never    Goals Met:  Independence with exercise equipment Exercise tolerated well No report of concerns or symptoms today  Goals Unmet:  Not Applicable  Comments: Pt able to follow exercise prescription today without complaint.  Will continue to monitor for progression.    Dr. Emily Filbert is Medical Director for [place].  Dr. Ottie Glazier is Medical Director for [facility].

## 2022-09-03 ENCOUNTER — Encounter: Payer: BC Managed Care – PPO | Admitting: *Deleted

## 2022-09-03 DIAGNOSIS — I5042 Chronic combined systolic (congestive) and diastolic (congestive) heart failure: Secondary | ICD-10-CM

## 2022-09-03 NOTE — Progress Notes (Signed)
Daily Session Note  Patient Details  Name: Mason Frank MRN: [id number] Date of Birth: [birth year] Referring Provider:   Flowsheet Row Cardiac Rehab from 06/22/2022 in [facility]  Referring Provider Lujean Amel MD       Encounter Date: 09/03/2022  Check In:  Session Check In - 09/03/22 0927       Check-In   Supervising physician immediately available to respond to emergencies See telemetry face sheet for immediately available ER MD    Location [facility]    Staff Present Darlyne Russian, RN, Lorin Mercy, MS, ACSM CEP, Exercise Physiologist;Joseph Tessie Fass, [place]    Virtual Visit No    Medication changes reported     No    Fall or balance concerns reported    No    Warm-up and Cool-down Performed on first and last piece of equipment    Resistance Training Performed Yes    VAD Patient? No    PAD/SET Patient? No      Pain Assessment   Currently in Pain? No/denies                Social History   Tobacco Use  Smoking Status Former   Packs/day: 1.00   Years: 45.00   Total pack years: 45.00   Types: Cigarettes   Quit date: 07/24/2021   Years since quitting: 1.1  Smokeless Tobacco Never    Goals Met:  Independence with exercise equipment Exercise tolerated well No report of concerns or symptoms today Strength training completed today  Goals Unmet:  Not Applicable  Comments: Pt able to follow exercise prescription today without complaint.  Will continue to monitor for progression.    Dr. Emily Filbert is Medical Director for [place].  Dr. Ottie Glazier is Medical Director for [facility].

## 2022-09-08 ENCOUNTER — Encounter: Payer: BC Managed Care – PPO | Admitting: *Deleted

## 2022-09-08 DIAGNOSIS — I5042 Chronic combined systolic (congestive) and diastolic (congestive) heart failure: Secondary | ICD-10-CM | POA: Diagnosis not present

## 2022-09-08 NOTE — Progress Notes (Signed)
Daily Session Note  Patient Details  Name: Mason Frank MRN: [id number] Date of Birth: 1960/12/02 Referring Provider:   Flowsheet Row Cardiac Rehab from 06/22/2022 in [facility]  Referring Provider Lujean Amel MD       Encounter Date: 09/08/2022  Check In:  Session Check In - 09/08/22 1013       Check-In   Supervising physician immediately available to respond to emergencies See telemetry face sheet for immediately available ER MD    Location [facility]    Staff Present Heath Lark, RN, BSN, CCRP;Jessica Ila, MA, RCEP, CCRP, Bertram Gala, MS, ACSM CEP, Exercise Physiologist;Noah Tickle, BS, Exercise Physiologist    Virtual Visit No    Medication changes reported     No    Fall or balance concerns reported    No    Warm-up and Cool-down Performed on first and last piece of equipment    Resistance Training Performed Yes    VAD Patient? No    PAD/SET Patient? No      Pain Assessment   Currently in Pain? No/denies               Exercise Prescription Changes - 09/08/22 0900       Home Exercise Plan   Plans to continue exercise at Home (comment)    Frequency Add 2 additional days to program exercise sessions.    Initial Home Exercises Provided 09/08/22             Social History   Tobacco Use  Smoking Status Former   Packs/day: 1.00   Years: 45.00   Total pack years: 45.00   Types: Cigarettes   Quit date: 07/24/2021   Years since quitting: 1.1  Smokeless Tobacco Never    Goals Met:  Independence with exercise equipment Exercise tolerated well No report of concerns or symptoms today  Goals Unmet:  Not Applicable  Comments: Pt able to follow exercise prescription today without complaint.  Will continue to monitor for progression.    Dr. Emily Filbert is Medical Director for [place].  Dr. Ottie Glazier is Medical Director for [facility].

## 2022-09-10 ENCOUNTER — Encounter: Payer: BC Managed Care – PPO | Admitting: *Deleted

## 2022-09-10 DIAGNOSIS — I5042 Chronic combined systolic (congestive) and diastolic (congestive) heart failure: Secondary | ICD-10-CM

## 2022-09-10 NOTE — Progress Notes (Signed)
Daily Session Note  Patient Details  Name: Mason Frank MRN: [id number] Date of Birth: March 26, 1961 Referring Provider:   Flowsheet Row Cardiac Rehab from 06/22/2022 in [facility] and Pulmonary Rehab  Referring Provider Lujean Amel MD       Encounter Date: 09/10/2022  Check In:  Session Check In - 09/10/22 0939       Check-In   Supervising physician immediately available to respond to emergencies See telemetry face sheet for immediately available ER MD    Location [facility]    Staff Present Darlyne Russian, RN, Lorin Mercy, MS, ACSM CEP, Exercise Physiologist;Joseph Tessie Fass, [place]    Virtual Visit No    Medication changes reported     No    Fall or balance concerns reported    No    Tobacco Cessation No Change    Warm-up and Cool-down Performed on first and last piece of equipment    Resistance Training Performed Yes    VAD Patient? No    PAD/SET Patient? No      Pain Assessment   Currently in Pain? No/denies                Social History   Tobacco Use  Smoking Status Former   Packs/day: 1.00   Years: 45.00   Total pack years: 45.00   Types: Cigarettes   Quit date: 07/24/2021   Years since quitting: 1.1  Smokeless Tobacco Never    Goals Met:  Independence with exercise equipment Exercise tolerated well No report of concerns or symptoms today Strength training completed today  Goals Unmet:  Not Applicable  Comments: Pt able to follow exercise prescription today without complaint.  Will continue to monitor for progression.    Dr. Emily Filbert is Medical Director for [place].  Dr. Ottie Glazier is Medical Director for [facility]monary Rehabilitation.

## 2022-09-15 ENCOUNTER — Encounter: Payer: BC Managed Care – PPO | Admitting: *Deleted

## 2022-09-15 DIAGNOSIS — I5042 Chronic combined systolic (congestive) and diastolic (congestive) heart failure: Secondary | ICD-10-CM

## 2022-09-15 NOTE — Progress Notes (Signed)
Daily Session Note  Patient Details  Name: Mason Frank MRN: [id number] Date of Birth: Mar 03, 1961 Referring Provider:   Flowsheet Row Cardiac Rehab from 06/22/2022 in [facility] Card[facility]  Referring Provider Lujean Amel MD       Encounter Date: 09/15/2022  Check In:  Session Check In - 09/15/22 1004       Check-In   Supervising physician immediately available to respond to emergencies See telemetry face sheet for immediately available ER MD    Location [facility]-Cardiac & Pulmonary Rehab    Staff Present Heath Lark, RN, BSN, CCRP;Laureen Owens Shark, BS, RRT, CPFT;Kara Maricela Bo, MS, ACSM CEP, Exercise Physiologist;Noah Tickle, BS, Exercise Physiologist    Virtual Visit No    Medication changes reported     No    Fall or balance concerns reported    No    Warm-up and Cool-down Performed on first and last piece of equipment    Resistance Training Performed Yes    VAD Patient? No    PAD/SET Patient? No      Pain Assessment   Currently in Pain? No/denies                Social History   Tobacco Use  Smoking Status Former   Packs/day: 1.00   Years: 45.00   Total pack years: 45.00   Types: Cigarettes   Quit date: 07/24/2021   Years since quitting: 1.1  Smokeless Tobacco Never    Goals Met:  Independence with exercise equipment Exercise tolerated well No report of concerns or symptoms today  Goals Unmet:  Not Applicable  Comments: Pt able to follow exercise prescription today without complaint.  Will continue to monitor for progression.    Dr. Emily Filbert is Medical Director for [facility].  Dr. Ottie Glazier is Medical Director for [facility] Pulmonary Rehabilitation.

## 2022-09-16 ENCOUNTER — Encounter: Payer: Self-pay | Admitting: *Deleted

## 2022-09-16 DIAGNOSIS — I5042 Chronic combined systolic (congestive) and diastolic (congestive) heart failure: Secondary | ICD-10-CM

## 2022-09-16 NOTE — Progress Notes (Signed)
Cardiac Individual Treatment Plan  Patient Details  Name: Mason Frank MRN: [id number] Date of Birth: 1961-07-17 Referring Provider:   Flowsheet Row Cardiac Rehab from 06/22/2022 in [facility] Cardiac and Pulmonary Rehab  Referring Provider Lujean Amel MD       Initial Encounter Date:  Flowsheet Row Cardiac Rehab from 06/22/2022 in [facility] Cardiac and Pulmonary Rehab  Date 06/22/22       Visit Diagnosis: Heart failure, systolic and diastolic, chronic ([place])  Patient's Home Medications on Admission:  Current Outpatient Medications:    allopurinol (ZYLOPRIM) 100 MG tablet, Take 1 tablet by mouth daily., Disp: , Rfl:    amiodarone (PACERONE) 200 MG tablet, Take by mouth., Disp: , Rfl:    apixaban (ELIQUIS) 5 MG TABS tablet, Take by mouth., Disp: , Rfl:    aspirin EC 81 MG tablet, Take by mouth., Disp: , Rfl:    atorvastatin (LIPITOR) 20 MG tablet, Take by mouth., Disp: , Rfl:    clopidogrel (PLAVIX) 300 MG TABS tablet, Take by mouth., Disp: , Rfl:    colchicine 0.6 MG tablet, Take 0.6 mg by mouth daily as needed (gout flares)., Disp: , Rfl:    colchicine 0.6 MG tablet, Take 2 tablets (1.'2mg'$ ) by mouth at first sign of gout flare followed by 1 tablet (0.'6mg'$ ) after 1 hour. (Max 1.'8mg'$  within 1 hour), Disp: , Rfl:    doxycycline (VIBRAMYCIN) 50 MG capsule, Take 1 capsule (50 mg total) by mouth daily. Take with food. (Evening meal), Disp: 90 capsule, Rfl: 1   empagliflozin (JARDIANCE) 10 MG TABS tablet, Take 1 tablet by mouth daily., Disp: , Rfl:    Fluticasone-Umeclidin-Vilant (TRELEGY ELLIPTA) 100-62.5-25 MCG/ACT AEPB, Inhale into the lungs., Disp: , Rfl:    furosemide (LASIX) 20 MG tablet, Take 20 mg by mouth daily at 12 noon., Disp: , Rfl:    ketoconazole (NIZORAL) 2 % cream, For fungal infection apply to the feet QHS., Disp: 60 g, Rfl: 6   losartan (COZAAR) 100 MG tablet, Take 100 mg by mouth daily at 12 noon., Disp: , Rfl:    metoprolol succinate (TOPROL-XL) 25 MG 24 hr tablet, Take by  mouth daily at 12 noon., Disp: , Rfl:    naproxen sodium (ALEVE) 220 MG tablet, Take 440 mg by mouth daily as needed (pain)., Disp: , Rfl:    potassium chloride (KLOR-CON) 20 MEQ packet, Take 20 mEq by mouth daily at 12 noon., Disp: , Rfl:    rosuvastatin (CRESTOR) 20 MG tablet, Take by mouth., Disp: , Rfl:    sacubitril-valsartan (ENTRESTO) 24-26 MG, Take 1 tablet by mouth 2 (two) times daily., Disp: , Rfl:    spironolactone (ALDACTONE) 25 MG tablet, Take 25 mg by mouth daily., Disp: , Rfl:    torsemide (DEMADEX) 20 MG tablet, Take 20 mg by mouth daily., Disp: , Rfl:    traZODone (DESYREL) 50 MG tablet, Take 50 mg by mouth at bedtime., Disp: , Rfl:   Past Medical History: Past Medical History:  Diagnosis Date   Cancer ([place]) 1990   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   Dysrhythmia    Pulmonary embolism ([place])    Sleep apnea     Tobacco Use: Social History   Tobacco Use  Smoking Status Former   Packs/day: 1.00   Years: 45.00   Total pack years: 45.00   Types: Cigarettes   Quit date: 07/24/2021   Years since quitting: 1.1  Smokeless Tobacco Never    Labs: Review Flowsheet  Latest Ref Rng & Units 10/17/2012  Labs for ITP Cardiac and Pulmonary Rehab  Cholestrol 0 - 200 mg/dL 239   LDL (calc) 0 - 100 mg/dL 164   HDL-C 40 - 60 mg/dL 59   Trlycerides 0 - 200 mg/dL 81      Exercise Target Goals: Exercise Program Goal: Individual exercise prescription set using results from initial 6 min walk test and THRR while considering  patient's activity barriers and safety.   Exercise Prescription Goal: Initial exercise prescription builds to 30-45 minutes a day of aerobic activity, 2-3 days per week.  Home exercise guidelines will be given to patient during program as part of exercise prescription that the participant will acknowledge.   Education: Aerobic Exercise: - Group verbal and visual presentation on the components of exercise prescription. Introduces F.I.T.T  principle from ACSM for exercise prescriptions.  Reviews F.I.T.T. principles of aerobic exercise including progression. Written material given at graduation. Flowsheet Row Cardiac Rehab from 09/10/2022 in [facility] Cardiac and Pulmonary Rehab  Date 09/10/22  Educator [facility]  Instruction Review Code 1- Verbalizes Understanding       Education: Resistance Exercise: - Group verbal and visual presentation on the components of exercise prescription. Introduces F.I.T.T principle from ACSM for exercise prescriptions  Reviews F.I.T.T. principles of resistance exercise including progression. Written material given at graduation. Flowsheet Row Cardiac Rehab from 09/10/2022 in [facility] Cardiac and Pulmonary Rehab  Date 07/14/22  Educator NT  Instruction Review Code 1- Verbalizes Understanding        Education: Exercise & Equipment Safety: - Individual verbal instruction and demonstration of equipment use and safety with use of the equipment. Flowsheet Row Cardiac Rehab from 09/10/2022 in [facility] Cardiac and Pulmonary Rehab  Date 06/15/22  Educator [facility]  Instruction Review Code 1- Verbalizes Understanding       Education: Exercise Physiology & General Exercise Guidelines: - Group verbal and written instruction with models to review the exercise physiology of the cardiovascular system and associated critical values. Provides general exercise guidelines with specific guidelines to those with heart or lung disease.  Flowsheet Row Cardiac Rehab from 09/10/2022 in [facility] Cardiac and Pulmonary Rehab  Date 09/03/22  Educator [facility]  Instruction Review Code 1- Verbalizes Understanding       Education: Flexibility, Balance, Mind/Body Relaxation: - Group verbal and visual presentation with interactive activity on the components of exercise prescription. Introduces F.I.T.T principle from ACSM for exercise prescriptions. Reviews F.I.T.T. principles of flexibility and balance exercise training including progression. Also  discusses the mind body connection.  Reviews various relaxation techniques to help reduce and manage stress (i.e. Deep breathing, progressive muscle relaxation, and visualization). Balance handout provided to take home. Written material given at graduation. Flowsheet Row Cardiac Rehab from 09/10/2022 in [facility] Cardiac and Pulmonary Rehab  Date 07/14/22  Educator NT  Instruction Review Code 1- Verbalizes Understanding       Activity Barriers & Risk Stratification:  Activity Barriers & Cardiac Risk Stratification - 06/22/22 1246       Activity Barriers & Cardiac Risk Stratification   Activity Barriers Shortness of Breath;Decreased Ventricular Function;Balance Concerns;Muscular Weakness;Deconditioning    Cardiac Risk Stratification High             6 Minute Walk:  6 Minute Walk     Row Name 06/22/22 1244         6 Minute Walk   Phase Initial     Distance 1045 feet     Walk Time 6 minutes     #  of Rest Breaks 0     MPH 1.98     METS 2.76     RPE 13     Perceived Dyspnea  2     VO2 Peak 9.67     Symptoms Yes (comment)     Comments SOB     Resting HR 87 bpm     Resting BP 126/64     Resting Oxygen Saturation  97 %     Exercise Oxygen Saturation  during 6 min walk 97 %     Max Ex. HR 132 bpm     Max Ex. BP 148/74     2 Minute Post BP 134/72              Oxygen Initial Assessment:   Oxygen Re-Evaluation:   Oxygen Discharge (Final Oxygen Re-Evaluation):   Initial Exercise Prescription:  Initial Exercise Prescription - 06/22/22 1200       Date of Initial Exercise RX and Referring Provider   Date 06/22/22    Referring Provider Lujean Amel MD      Oxygen   Maintain Oxygen Saturation 88% or higher      Treadmill   MPH 1.8    Grade 0.5    Minutes 15    METs 2.5      Recumbant Bike   Level 2    RPM 50    Watts 32    Minutes 15    METs 2.5      NuStep   Level 2    SPM 80    Minutes 15    METs 2.5      Track   Laps 26    Minutes 15     METs 2.41      Prescription Details   Frequency (times per week) 3    Duration Progress to 30 minutes of continuous aerobic without signs/symptoms of physical distress      Intensity   THRR 40-80% of Max Heartrate 115-144    Ratings of Perceived Exertion 11-13    Perceived Dyspnea 0-4      Progression   Progression Continue to progress workloads to maintain intensity without signs/symptoms of physical distress.      Resistance Training   Training Prescription Yes    Weight 4 lb    Reps 10-15             Perform Capillary Blood Glucose checks as needed.  Exercise Prescription Changes:   Exercise Prescription Changes     Row Name 06/22/22 1200 06/30/22 1400 07/13/22 1500 07/30/22 1400 08/10/22 1500     Response to Exercise   Blood Pressure (Admit) 126/64 90/56 124/64 106/64 124/60   Blood Pressure (Exercise) 148/74 124/76 162/60 146/74 --   Blood Pressure (Exit) 134/72 108/66 102/60 112/60 102/60   Heart Rate (Admit) 81 bpm 72 bpm 72 bpm 79 bpm 93 bpm   Heart Rate (Exercise) 132 bpm 148 bpm 122 bpm 98 bpm 105 bpm   Heart Rate (Exit) 96 bpm 125 bpm 98 bpm 60 bpm 75 bpm   Oxygen Saturation (Admit) 97 % -- -- -- --   Oxygen Saturation (Exercise) 97 % -- -- -- --   Rating of Perceived Exertion (Exercise) '13 12 14 12 13   '$ Perceived Dyspnea (Exercise) 2 0 -- -- --   Symptoms SOB None none none none   Comments walk test results First 2 days of Rehab -- -- --   Duration -- Continue with 30 min of aerobic  exercise without signs/symptoms of physical distress. Continue with 30 min of aerobic exercise without signs/symptoms of physical distress. Continue with 30 min of aerobic exercise without signs/symptoms of physical distress. Continue with 30 min of aerobic exercise without signs/symptoms of physical distress.   Intensity -- THRR unchanged THRR unchanged THRR unchanged THRR unchanged     Progression   Progression -- Continue to progress workloads to maintain intensity without  signs/symptoms of physical distress. Continue to progress workloads to maintain intensity without signs/symptoms of physical distress. Continue to progress workloads to maintain intensity without signs/symptoms of physical distress. Continue to progress workloads to maintain intensity without signs/symptoms of physical distress.   Average METs -- 2.15 2.14 2.03 2.28     Resistance Training   Training Prescription -- Yes Yes Yes Yes   Weight -- 4 lb 4 lb 4 lb 5 lb   Reps -- 10-15 10-15 10-15 10-15     Interval Training   Interval Training -- No Yes No No   Equipment -- -- Recumbant Bike -- --   Comments -- -- level 2-8; light rpm -- --     Treadmill   MPH -- 1.8 1.1 1.1 1.4   Grade -- 0.5 1 0.5 1   Minutes -- '15 15 15 15   '$ METs -- 2.5 2 1.92 2.26     Recumbant Bike   Level -- 2.1 2  intervals up to '8 3 4   '$ Watts -- -- -- -- 38   Minutes -- '15 15 15 15   '$ METs -- 1.'8 2 2 '$ 2.86     NuStep   Level -- '2 4 4 '$ --   Minutes -- '15 15 15 '$ --   METs -- 2.2 2.2 2.3 --     REL-XR   Level -- -- -- -- 2   Minutes -- -- -- -- 15     Oxygen   Maintain Oxygen Saturation -- 88% or higher 88% or higher 88% or higher 88% or higher    Row Name 08/26/22 1600 09/07/22 1500 09/08/22 0900         Response to Exercise   Blood Pressure (Admit) 98/60 134/58 --     Blood Pressure (Exit) 112/62 112/60 --     Heart Rate (Admit) 72 bpm 75 bpm --     Heart Rate (Exercise) 86 bpm 85 bpm --     Heart Rate (Exit) 82 bpm 77 bpm --     Rating of Perceived Exertion (Exercise) 13 14 --     Symptoms none none --     Duration Continue with 30 min of aerobic exercise without signs/symptoms of physical distress. Continue with 30 min of aerobic exercise without signs/symptoms of physical distress. --     Intensity THRR unchanged THRR unchanged --       Progression   Progression Continue to progress workloads to maintain intensity without signs/symptoms of physical distress. Continue to progress workloads to  maintain intensity without signs/symptoms of physical distress. --     Average METs 2.02 2.85 --       Resistance Training   Training Prescription Yes Yes --     Weight 5 lb 5 lb --     Reps 10-15 10-15 --       Interval Training   Interval Training No No --       Treadmill   MPH 1 -- --     Grade 1 -- --     Minutes 15 -- --  METs 1.9 -- --       Recumbant Bike   Level 6 7 --     Watts -- 32 --     Minutes 15 15 --     METs -- 2.73 --       NuStep   Level 5 6 --     Minutes 15 15 --     METs 2.3 3.2 --       Recumbant Elliptical   Level 3 -- --     Minutes 15 -- --     METs 2.1 -- --       T5 Nustep   Level 4 4 --     Minutes 15 15 --     METs 2.2 -- --       Track   Laps -- 20 --     Minutes -- 15 --     METs -- 2.09 --       Home Exercise Plan   Plans to continue exercise at -- -- Home (comment)     Frequency -- -- Add 2 additional days to program exercise sessions.     Initial Home Exercises Provided -- -- 09/08/22       Oxygen   Maintain Oxygen Saturation 88% or higher 88% or higher --              Exercise Comments:   Exercise Goals and Review:   Exercise Goals     Row Name 06/22/22 1250             Exercise Goals   Increase Physical Activity Yes       Intervention Provide advice, education, support and counseling about physical activity/exercise needs.;Develop an individualized exercise prescription for aerobic and resistive training based on initial evaluation findings, risk stratification, comorbidities and participant's personal goals.       Expected Outcomes Short Term: Attend rehab on a regular basis to increase amount of physical activity.;Long Term: Add in home exercise to make exercise part of routine and to increase amount of physical activity.;Long Term: Exercising regularly at least 3-5 days a week.       Increase Strength and Stamina Yes       Intervention Provide advice, education, support and counseling about physical  activity/exercise needs.;Develop an individualized exercise prescription for aerobic and resistive training based on initial evaluation findings, risk stratification, comorbidities and participant's personal goals.       Expected Outcomes Short Term: Increase workloads from initial exercise prescription for resistance, speed, and METs.;Short Term: Perform resistance training exercises routinely during rehab and add in resistance training at home;Long Term: Improve cardiorespiratory fitness, muscular endurance and strength as measured by increased METs and functional capacity (6MWT)       Able to understand and use rate of perceived exertion (RPE) scale Yes       Intervention Provide education and explanation on how to use RPE scale       Expected Outcomes Short Term: Able to use RPE daily in rehab to express subjective intensity level;Long Term:  Able to use RPE to guide intensity level when exercising independently       Able to understand and use Dyspnea scale Yes       Intervention Provide education and explanation on how to use Dyspnea scale       Expected Outcomes Short Term: Able to use Dyspnea scale daily in rehab to express subjective sense of shortness of breath during exertion;Long Term: Able to  use Dyspnea scale to guide intensity level when exercising independently       Knowledge and understanding of Target Heart Rate Range (THRR) Yes       Intervention Provide education and explanation of THRR including how the numbers were predicted and where they are located for reference       Expected Outcomes Short Term: Able to state/look up THRR;Short Term: Able to use daily as guideline for intensity in rehab;Long Term: Able to use THRR to govern intensity when exercising independently       Able to check pulse independently Yes       Intervention Provide education and demonstration on how to check pulse in carotid and radial arteries.;Review the importance of being able to check your own pulse for  safety during independent exercise       Expected Outcomes Short Term: Able to explain why pulse checking is important during independent exercise;Long Term: Able to check pulse independently and accurately       Understanding of Exercise Prescription Yes       Intervention Provide education, explanation, and written materials on patient's individual exercise prescription       Expected Outcomes Short Term: Able to explain program exercise prescription;Long Term: Able to explain home exercise prescription to exercise independently                Exercise Goals Re-Evaluation :  Exercise Goals Re-Evaluation     Row Name 06/23/22 1012 06/30/22 0955 06/30/22 1502 07/13/22 1547 07/30/22 1437     Exercise Goal Re-Evaluation   Exercise Goals Review Increase Physical Activity;Able to understand and use rate of perceived exertion (RPE) scale;Knowledge and understanding of Target Heart Rate Range (THRR);Understanding of Exercise Prescription;Increase Strength and Stamina;Able to check pulse independently Increase Physical Activity;Able to understand and use rate of perceived exertion (RPE) scale;Knowledge and understanding of Target Heart Rate Range (THRR);Understanding of Exercise Prescription;Increase Strength and Stamina;Able to check pulse independently;Able to understand and use Dyspnea scale Increase Physical Activity;Increase Strength and Stamina;Understanding of Exercise Prescription Increase Physical Activity;Increase Strength and Stamina;Understanding of Exercise Prescription Increase Physical Activity;Increase Strength and Stamina;Understanding of Exercise Prescription   Comments Reviewed RPE scale, THR and program prescription with pt today.  Pt voiced understanding and was given a copy of goals to take home. Mason Frank is off to a good start in rehab.  He has not been doing much at home with his afib.  He is going to start walking and trying the staff videos at home. Reviewed home exercise with pt  today.  Pt plans to walking and staff vidoes for exercise.  Reviewed THR, pulse, RPE, sign and symptoms, pulse oximetery and when to call 911 or MD.  Also discussed weather considerations and indoor options.  Pt voiced understanding. Mason Frank is off to a good start in rehab. He had an average MET level of 2.15 METs during his first two exercise sessions. He also tolerated the treadmill at a speed of 1.8 mph and an incline of 0.5%. We will continue to monitor his progress in the program. Mason Frank continues to do well in rehab.  He started doing light intervals on the recumbent bike, varying levels 2-8. He also increased to level 4 on the T4 Nustep. His HR has been better controlled. We will continue to monitor. Mason Frank is doing well in rehab. He has consistently worked at an average of 2 METs. He also has continued to do well on the recumbent bike at level 3 and the T4  at level 4. Mason Frank could benefit from increasing his workload on the treadmill. We will continue to monitor his progress in the program.   Expected Outcomes Short: Use RPE daily to regulate intensity.  Long: Follow program prescription in THR. Short: Start to add in more exercise at home Long: Conitnue to build stamina Short: Continue to follow current exercise prescription. Long: Continue to increase strength and stamina. Short: Increase speed on treadmill Long: Continue to increase overall MET level Short: Increase speed on treadmill. Long: Continue to increase strength and stamina.    [place] Name 08/07/22 2119 08/10/22 1525 08/26/22 1607 08/27/22 0939 09/07/22 1524     Exercise Goal Re-Evaluation   Exercise Goals Review Increase Physical Activity;Increase Strength and Stamina;Understanding of Exercise Prescription Increase Physical Activity;Increase Strength and Stamina;Understanding of Exercise Prescription Increase Physical Activity;Increase Strength and Stamina;Understanding of Exercise Prescription Increase Physical Activity;Increase Strength and  Stamina;Understanding of Exercise Prescription Increase Physical Activity;Increase Strength and Stamina;Understanding of Exercise Prescription   Comments Mason Frank is doing well in rehab.  He is still using his VR system for his exercise routines.  His wife does it too.  His strength is getting a little better, but his stamina is not wear he wants to be.  We talked about going for more walks to help with stamina.  He wore a holter for 24 hours and should get results next week. Mason Frank continues to do well in rehab. He did increase his watts on the recumbent bike which reached 38! He also increase his workload on the treadmill to reach a 1.4 mph/ 1% incline. HR is getting up but not always quite in his THR, though his RPEs are appropriate. Will continue to monitor. Mason Frank continues to do well in rehab. He has continued to work at an average MET level above 2 METs. He has also improved to level 5 on the T4 and level 6 on the recumbent bike. He began using the recumbent elliptical as well and tolerated level 3. We will continue to monitor his progress in the program. Mason Frank is doing well in rehab.  He has not been doing his home exercise.  It is cold outside and not fun to walk.  We talked about tyring our staff videos or going to the mall to walk inside.  He has started to notice that his strength and stamina are starting to improve some. Mason Frank continues to do well in rehab. He increased to level 7 on the recumbent bike and reached up to 32 watts on the bike! He also is now working level 6 on the T4 Nustep. He walked the track again in a long time and reached 20 laps. He is due for his post 6WMT soon and hope to see significant improvement.   Expected Outcomes Short: Continue to add in more walking Long: Continue to improve stamina Short: Continue to increase watts on recumbent bike Long: Continue to increase overall MET level Short: Continue to increase workload on the treadmill. Long: Continue to improve strength and stamina.  Short: Get into home exercise habit Long: Continue to improve stamina Short: Improve on post 6MWT Long: Continue to increase overall MET level    Row Name 09/08/22 0939             Exercise Goal Re-Evaluation   Exercise Goals Review Increase Physical Activity;Increase Strength and Stamina;Understanding of Exercise Prescription;Able to understand and use rate of perceived exertion (RPE) scale;Knowledge and understanding of Target Heart Rate Range (THRR);Able to understand and use Dyspnea scale;Able  to check pulse independently       Comments Reviewed home exercise with pt today.  Pt plans to continue to walk and use VR [employer] game at home for exercise.  Reviewed THR, pulse, RPE, sign and symptoms, pulse oximetery and when to call 911 or MD.  Also discussed weather considerations and indoor options.  Pt voiced understanding.       Expected Outcomes Short: Continue to add in exercise at home Long: Continue to exercise indpendently                Discharge Exercise Prescription (Final Exercise Prescription Changes):  Exercise Prescription Changes - 09/08/22 0900       Home Exercise Plan   Plans to continue exercise at Home (comment)    Frequency Add 2 additional days to program exercise sessions.    Initial Home Exercises Provided 09/08/22             Nutrition:  Target Goals: Understanding of nutrition guidelines, daily intake of sodium '1500mg'$ , cholesterol '200mg'$ , calories 30% from fat and 7% or less from saturated fats, daily to have 5 or more servings of fruits and vegetables.  Education: All About Nutrition: -Group instruction provided by verbal, written material, interactive activities, discussions, models, and posters to present general guidelines for heart healthy nutrition including fat, fiber, MyPlate, the role of sodium in heart healthy nutrition, utilization of the nutrition label, and utilization of this knowledge for meal planning. Follow up email sent as well.  Written material given at graduation. Flowsheet Row Cardiac Rehab from 09/10/2022 in [facility] Cardiac and Pulmonary Rehab  Date 07/23/22  Educator [facility]  Instruction Review Code 1- Verbalizes Understanding       Biometrics:  Pre Biometrics - 06/22/22 1251       Pre Biometrics   Height 6' 3.6" (1.92 m)    Weight 304 lb 1.6 oz (137.9 kg)    Waist Circumference 45 inches    Hip Circumference 48 inches    Waist to Hip Ratio 0.94 %    BMI (Calculated) 37.42    Single Leg Stand 8.8 seconds              Nutrition Therapy Plan and Nutrition Goals:  Nutrition Therapy & Goals - 06/22/22 0912       Nutrition Therapy   Diet Heart healthy, low Na    Drug/Food Interactions Statins/Certain Fruits    Protein (specify units) 100g    Fiber 33 grams    Whole Grain Foods 3 servings    Saturated Fats 18 max. grams    Fruits and Vegetables 8 servings/day    Sodium 1.5 grams      Personal Nutrition Goals   Nutrition Goal ST: review paperwork, consider preparing more meals at home. LT: limit eating out <2-3x/week, include 5-8 servings of fruit/vegetables, increase varitey, limit salt <2g/day    Comments 62 y.o. M admitted to cardiac rehab for heart failure. PMHx inlcudes bilateral pulmonary embolism, cancer, cardiomyopathy, COPD, CAD, HFrEF, HTN, HLD, MI, sleep apnea, gout. Relevant medications includes colchicine, jardiance, crestor, torsemide, trazodone. PYP Score: 70. Vegetables & Fruits 7/12. Breads, Grains & Cereals 11/12. Red & Processed Meat 10/12. Poultry 2/2. Fish & Shellfish 0/4. Beans, Nuts & Seeds 1/4. Milk & Dairy Foods 5/6. Toppings, Oils, Seasonings & Salt 14/20. Sweets, Snacks & Restaurant Food 11/14. Beverages 9/10.    B: cereal (2% milk and raisin bran or kashi) with OJ L: [place] food out to eat (chicken soup) or PB and  J sandwich (whole grain natures own) D: sometimes will not eat (will have [place] food) Drinks: water. Mason Frank reports that he like salads as well sometimes during the  week. Discussed general heart healthy eating and discussed considerations with eating out (whole grains are hard to find, limited variety, high fat red meat, usually vegetables are cooked with butter, high in sodium, etc). Suggested cooking more meals at home.      Intervention Plan   Intervention Prescribe, educate and counsel regarding individualized specific dietary modifications aiming towards targeted core components such as weight, hypertension, lipid management, diabetes, heart failure and other comorbidities.;Nutrition handout(s) given to patient.    Expected Outcomes Short Term Goal: Understand basic principles of dietary content, such as calories, fat, sodium, cholesterol and nutrients.;Short Term Goal: A plan has been developed with personal nutrition goals set during dietitian appointment.;Long Term Goal: Adherence to prescribed nutrition plan.             Nutrition Assessments:  MEDIFICTS Score Key: ?70 Need to make dietary changes  40-70 Heart Healthy Diet ? 40 Therapeutic Level Cholesterol Diet  Flowsheet Row Cardiac Rehab from 06/22/2022 in [facility] Cardiac and Pulmonary Rehab  Picture Your Plate Total Score on Admission 70      Picture Your Plate Scores: <96 Unhealthy dietary pattern with much room for improvement. 41-50 Dietary pattern unlikely to meet recommendations for good health and room for improvement. 51-60 More healthful dietary pattern, with some room for improvement.  >60 Healthy dietary pattern, although there may be some specific behaviors that could be improved.    Nutrition Goals Re-Evaluation:  Nutrition Goals Re-Evaluation     [place] Name 06/30/22 0959 08/07/22 0932 08/27/22 0943         Goals   Nutrition Goal ST: review paperwork, consider preparing more meals at home. LT: limit eating out <2-3x/week, include 5-8 servings of fruit/vegetables, increase varitey, limit salt <2g/day Short: Continue to add in more fruits and vegetables. Long; Continue to  add variety Short: Continue to add in more fruits and vegetables LOng: continue to add in more variety and enough protein.     Comment Mason Frank is doing well in rehab. He is trying to do better about making meals at home. He has not been going out as much.  He is trying to add in more fruit and vegetables, but not quite enough yet.  He is adding fruit to his cereal to help.  He is watching his sodium and doing better with it.  he is thinking about getting a juicer to help. Mason Frank is doing better with his diet.  He has cut back on his salt.  He is cooking more at home!!  He has had some not tasted so good.  We talked about trying some different Mrs. Dash seasoning.  He is trying to add more fruits in morning on cereal or cottage cheese. Mason Frank is doing well with his diet. He continues to cook more and has cut back on his salt.  He has stopped eating out as much and avoids fried foods.  He is still working on adding in more fruit and vegetables still.  He is eating a few more salads now.     Expected Outcome Short: Continue to add in more fruits and vegetables.  Long; Continue to add variety Short: Continue to add in more fruits and vegetables LOng: continue to add in more variety and enough protein. Short: Conitnue to cook at home Long: Continue to add more fruit and  vegetables              Nutrition Goals Discharge (Final Nutrition Goals Re-Evaluation):  Nutrition Goals Re-Evaluation - 08/27/22 0943       Goals   Nutrition Goal Short: Continue to add in more fruits and vegetables LOng: continue to add in more variety and enough protein.    Comment Mason Frank is doing well with his diet. He continues to cook more and has cut back on his salt.  He has stopped eating out as much and avoids fried foods.  He is still working on adding in more fruit and vegetables still.  He is eating a few more salads now.    Expected Outcome Short: Conitnue to cook at home Long: Continue to add more fruit and vegetables              Psychosocial: Target Goals: Acknowledge presence or absence of significant depression and/or stress, maximize coping skills, provide positive support system. Participant is able to verbalize types and ability to use techniques and skills needed for reducing stress and depression.   Education: Stress, Anxiety, and Depression - Group verbal and visual presentation to define topics covered.  Reviews how body is impacted by stress, anxiety, and depression.  Also discusses healthy ways to reduce stress and to treat/manage anxiety and depression.  Written material given at graduation. Flowsheet Row Cardiac Rehab from 09/10/2022 in [facility] Cardiac and Pulmonary Rehab  Date 08/27/22  Educator [facility]  Instruction Review Code 1- [employer] Understanding       Education: Sleep Hygiene -Provides group verbal and written instruction about how sleep can affect your health.  Define sleep hygiene, discuss sleep cycles and impact of sleep habits. Review good sleep hygiene tips.    Initial Review & Psychosocial Screening:  Initial Psych Review & Screening - 06/15/22 1012       Initial Review   Current issues with Current Stress Concerns    Source of Stress Concerns Occupation    Comments He has lost his job recently and is doing ok with being out of work. He is ready to get his health back in order so he can work a less demanding job. He does not take any medicaions for his mood.      Family Dynamics   Good Support System? Yes    Comments He can look to his wife for support, sisters and brother. His two daughters are nearby also.      Barriers   Psychosocial barriers to participate in program The patient should benefit from training in stress management and relaxation.;There are no identifiable barriers or psychosocial needs.      Screening Interventions   Interventions Encouraged to exercise;To provide support and resources with identified psychosocial needs;Provide feedback about the scores to  participant    Expected Outcomes Short Term goal: Utilizing psychosocial counselor, staff and physician to assist with identification of specific Stressors or current issues interfering with healing process. Setting desired goal for each stressor or current issue identified.;Long Term Goal: Stressors or current issues are controlled or eliminated.;Short Term goal: Identification and review with participant of any Quality of Life or Depression concerns found by scoring the questionnaire.;Long Term goal: The participant improves quality of Life and PHQ9 Scores as seen by post scores and/or verbalization of changes             Quality of Life Scores:   Quality of Life - 06/22/22 1251       Quality of Life  Select Quality of Life      Quality of Life Scores   Health/Function Pre 20.07 %    Socioeconomic Pre 21.5 %    Psych/Spiritual Pre 22.86 %    Family Pre 24 %    GLOBAL Pre 21.51 %            Scores of 19 and below usually indicate a poorer quality of life in these areas.  A difference of  2-3 points is a clinically meaningful difference.  A difference of 2-3 points in the total score of the Quality of Life Index has been associated with significant improvement in overall quality of life, self-image, physical symptoms, and general health in studies assessing change in quality of life.  PHQ-9: Review Flowsheet       06/22/2022 06/15/2019 04/26/2019  Depression screen PHQ 2/9  Decreased Interest 0 0 1  Down, Depressed, Hopeless 0 0 0  PHQ - 2 Score 0 0 1  Altered sleeping 1 0 0  Tired, decreased energy '1 1 1  '$ Change in appetite 1 1 0  Feeling bad or failure about yourself  0 0 0  Trouble concentrating 0 0 0  Moving slowly or fidgety/restless 0 0 0  Suicidal thoughts 0 0 0  PHQ-9 Score '3 2 2  '$ Difficult doing work/chores Not difficult at all Not difficult at all Not difficult at all   Interpretation of Total Score  Total Score Depression Severity:  1-4 = Minimal  depression, 5-9 = Mild depression, 10-14 = Moderate depression, 15-19 = Moderately severe depression, 20-27 = Severe depression   Psychosocial Evaluation and Intervention:  Psychosocial Evaluation - 06/15/22 1015       Psychosocial Evaluation & Interventions   Interventions Encouraged to exercise with the program and follow exercise prescription;Relaxation education;Stress management education    Comments He has lost his job recently and is doing ok with being out of work. He is ready to get his health back in order so he can work a less demanding job. He does not take any medicaions for his mood.    Expected Outcomes Short: Start HeartTrack to help with mood. Long: Maintain a healthy mental state    Continue Psychosocial Services  Follow up required by staff             Psychosocial Re-Evaluation:  Psychosocial Re-Evaluation     Row Name 06/30/22 0956 08/07/22 0930 08/27/22 0941         Psychosocial Re-Evaluation   Current issues with Current Stress Concerns Current Stress Concerns Current Stress Concerns;Current Sleep Concerns     Comments Mason Frank is doing well in rehab.  He denies any major stressors currently.  He is currently out of work, but finances have been okay.  His wife is worried, but he feels good.  He is sleeping fairly well when he is able to sleep.  He has trouble getting to sleep.  He has trazadone to help as melatonin did not help.  We talked about taking the right does for him. Mason Frank is doing well in rehab.  He loss a friend yesterday and is coping with his loss.  He is sleeping a little better.  His health is his biggest stressors.  His dog continues to bring him joy and laughter every day. Mason Frank is doing well in rehab.  He denies any current stressors.  He is doing okay with sleep.  He struggles with getting to sleep.  We talked about trying melatonin again and setting up  room for bedtime and turning off screens prior to sleep.     Expected Outcomes [place]rot: Continue to  work on sleep Long; Conitnue to exercise for mental boost Short: Continue to work on sleep Long: Continue to exercise for mental boost Short: Try to turn off screens at night to help fall asleep Long: Continue to stay positive     Interventions Encouraged to attend Cardiac Rehabilitation for the exercise Encouraged to attend Cardiac Rehabilitation for the exercise Encouraged to attend Cardiac Rehabilitation for the exercise;Stress management education     Continue Psychosocial Services  -- Follow up required by staff Follow up required by staff              Psychosocial Discharge (Final Psychosocial Re-Evaluation):  Psychosocial Re-Evaluation - 08/27/22 0941       Psychosocial Re-Evaluation   Current issues with Current Stress Concerns;Current Sleep Concerns    Comments Mason Frank is doing well in rehab.  He denies any current stressors.  He is doing okay with sleep.  He struggles with getting to sleep.  We talked about trying melatonin again and setting up room for bedtime and turning off screens prior to sleep.    Expected Outcomes Short: Try to turn off screens at night to help fall asleep Long: Continue to stay positive    Interventions Encouraged to attend Cardiac Rehabilitation for the exercise;Stress management education    Continue Psychosocial Services  Follow up required by staff             Vocational Rehabilitation: Provide vocational rehab assistance to qualifying candidates.   Vocational Rehab Evaluation & Intervention:  Vocational Rehab - 06/22/22 1252       Initial Vocational Rehab Evaluation & Intervention   Assessment shows need for Vocational Rehabilitation No             Education: Education Goals: Education classes will be provided on a variety of topics geared toward better understanding of heart health and risk factor modification. Participant will state understanding/return demonstration of topics presented as noted by education test scores.  Learning  Barriers/Preferences:  Learning Barriers/Preferences - 06/15/22 1012       Learning Barriers/Preferences   Learning Barriers None    Learning Preferences None             General Cardiac Education Topics:  AED/CPR: - Group verbal and written instruction with the use of models to demonstrate the basic use of the AED with the basic ABC's of resuscitation.   Anatomy and Cardiac Procedures: - Group verbal and visual presentation and models provide information about basic cardiac anatomy and function. Reviews the testing methods done to diagnose heart disease and the outcomes of the test results. Describes the treatment choices: Medical Management, Angioplasty, or Coronary Bypass Surgery for treating various heart conditions including Myocardial Infarction, Angina, Valve Disease, and Cardiac Arrhythmias.  Written material given at graduation. Flowsheet Row Cardiac Rehab from 09/10/2022 in [facility] Cardiac and Pulmonary Rehab  Education need identified 06/22/22  Date 07/30/22  Educator SB  Instruction Review Code 1- Verbalizes Understanding       Medication Safety: - Group verbal and visual instruction to review commonly prescribed medications for heart and lung disease. Reviews the medication, class of the drug, and side effects. Includes the steps to properly store meds and maintain the prescription regimen.  Written material given at graduation.   Intimacy: - Group verbal instruction through game format to discuss how heart and lung disease can affect sexual intimacy. Written  material given at graduation.. Flowsheet Row Cardiac Rehab from 09/10/2022 in [facility] Cardiac and Pulmonary Rehab  Date 09/10/22  Educator [facility]  Instruction Review Code 1- Verbalizes Understanding       Know Your Numbers and Heart Failure: - Group verbal and visual instruction to discuss disease risk factors for cardiac and pulmonary disease and treatment options.  Reviews associated critical values for  Overweight/Obesity, Hypertension, Cholesterol, and Diabetes.  Discusses basics of heart failure: signs/symptoms and treatments.  Introduces Heart Failure Zone chart for action plan for heart failure.  Written material given at graduation.   Infection Prevention: - Provides verbal and written material to individual with discussion of infection control including proper hand washing and proper equipment cleaning during exercise session. Flowsheet Row Cardiac Rehab from 09/10/2022 in [facility] Cardiac and Pulmonary Rehab  Date 06/15/22  Educator [facility]  Instruction Review Code 1- Verbalizes Understanding       Falls Prevention: - Provides verbal and written material to individual with discussion of falls prevention and safety. Flowsheet Row Cardiac Rehab from 09/10/2022 in [facility] Cardiac and Pulmonary Rehab  Date 06/15/22  Educator [facility]  Instruction Review Code 1- Verbalizes Understanding       Other: -Provides group and verbal instruction on various topics (see comments)   Knowledge Questionnaire Score:  Knowledge Questionnaire Score - 06/22/22 1252       Knowledge Questionnaire Score   Pre Score 24/26             Core Components/Risk Factors/Patient Goals at Admission:  Personal Goals and Risk Factors at Admission - 06/22/22 1253       Core Components/Risk Factors/Patient Goals on Admission    Weight Management Yes;Weight Loss;Obesity    Intervention Weight Management/Obesity: Establish reasonable short term and long term weight goals.;Weight Management: Develop a combined nutrition and exercise program designed to reach desired caloric intake, while maintaining appropriate intake of nutrient and fiber, sodium and fats, and appropriate energy expenditure required for the weight goal.;Weight Management: Provide education and appropriate resources to help participant work on and attain dietary goals.;Obesity: Provide education and appropriate resources to help participant work on and  attain dietary goals.    Admit Weight 304 lb 1.6 oz (137.9 kg)    Goal Weight: Short Term 299 lb (135.6 kg)    Goal Weight: Long Term 269 lb (122 kg)    Expected Outcomes Short Term: Continue to assess and modify interventions until short term weight is achieved;Long Term: Adherence to nutrition and physical activity/exercise program aimed toward attainment of established weight goal;Weight Loss: Understanding of general recommendations for a balanced deficit meal plan, which promotes 1-2 lb weight loss per week and includes a negative energy balance of [phone] kcal/d;Understanding recommendations for meals to include 15-35% energy as protein, 25-35% energy from fat, 35-60% energy from carbohydrates, less than '200mg'$  of dietary cholesterol, 20-35 gm of total fiber daily;Understanding of distribution of calorie intake throughout the day with the consumption of 4-5 meals/snacks    Heart Failure Yes    Intervention Provide a combined exercise and nutrition program that is supplemented with education, support and counseling about heart failure. Directed toward relieving symptoms such as shortness of breath, decreased exercise tolerance, and extremity edema.    Expected Outcomes Improve functional capacity of life;Short term: Attendance in program 2-3 days a week with increased exercise capacity. Reported lower sodium intake. Reported increased fruit and vegetable intake. Reports medication compliance.;Short term: Daily weights obtained and reported for increase. Utilizing diuretic protocols set by physician.;Long  term: Adoption of self-care skills and reduction of barriers for early signs and symptoms recognition and intervention leading to self-care maintenance.    Hypertension Yes    Intervention Provide education on lifestyle modifcations including regular physical activity/exercise, weight management, moderate sodium restriction and increased consumption of fresh fruit, vegetables, and low fat dairy,  alcohol moderation, and smoking cessation.;Monitor prescription use compliance.    Expected Outcomes Short Term: Continued assessment and intervention until BP is < 140/50m HG in hypertensive participants. < 130/854mHG in hypertensive participants with diabetes, heart failure or chronic kidney disease.;Long Term: Maintenance of blood pressure at goal levels.    Lipids Yes    Intervention Provide education and support for participant on nutrition & aerobic/resistive exercise along with prescribed medications to achieve LDL '70mg'$ , HDL >'40mg'$ .    Expected Outcomes Short Term: Participant states understanding of desired cholesterol values and is compliant with medications prescribed. Participant is following exercise prescription and nutrition guidelines.;Long Term: Cholesterol controlled with medications as prescribed, with individualized exercise RX and with personalized nutrition plan. Value goals: LDL < '70mg'$ , HDL > 40 mg.             Education:Diabetes - Individual verbal and written instruction to review signs/symptoms of diabetes, desired ranges of glucose level fasting, after meals and with exercise. Acknowledge that pre and post exercise glucose checks will be done for 3 sessions at entry of program.   Core Components/Risk Factors/Patient Goals Review:   Goals and Risk Factor Review     Row Name 06/30/22 1001 08/07/22 0928 08/27/22 0945         Core Components/Risk Factors/Patient Goals Review   Personal Goals Review Weight Management/Obesity;Lipids;Hypertension;Heart Failure Weight Management/Obesity;Lipids;Hypertension;Heart Failure Weight Management/Obesity;Lipids;Hypertension;Heart Failure     Review FrJosph Machos doing well in rehab. His weight is trending down. He can almost see his feet again. His pressures have been running low in rehab. He is not checking them at home so we talked about getting back to the habit of checking again.  He has not had any heart failure symptoms other than  fatigue and SOB with activity, but that is why he is back in rehab.  FrJosph Machos going to add in mroe exercise at home. Mason Frank wore a heart monitor this week and will get results next week.  His pressures are doing well in class.  He has not had any heart failure sysmptoms.  He continues to work on weight loss but it has been stable. FrJosph Machos doing well in rehab. His weight has been steady.  He would like to lose more so we talked about adding in more exercise and being more active in the day.  He is doing well about taking his lasix. His pressures are doing well.  He denies any current heart failure symptoms as well.  His heart monitor showed that he was still doing well and did not need a pacemaker yet.     Expected Outcomes Short: Continue to work on weight loss and adding variety Long; Conitnue to monitor risk factors Short: Continue to work on weight loss and get results of heart monitor Long: Continue to monitor risk factors Short: Add in more exercise to help with weight loss Long; Conitnue to monitor risk factors              Core Components/Risk Factors/Patient Goals at Discharge (Final Review):   Goals and Risk Factor Review - 08/27/22 0945       Core Components/Risk Factors/Patient Goals Review  Personal Goals Review Weight Management/Obesity;Lipids;Hypertension;Heart Failure    Review Mason Frank is doing well in rehab. His weight has been steady.  He would like to lose more so we talked about adding in more exercise and being more active in the day.  He is doing well about taking his lasix. His pressures are doing well.  He denies any current heart failure symptoms as well.  His heart monitor showed that he was still doing well and did not need a pacemaker yet.    Expected Outcomes Short: Add in more exercise to help with weight loss Long; Conitnue to monitor risk factors             ITP Comments:  ITP Comments     Row Name 06/15/22 1010 06/22/22 1242 06/23/22 1012 06/24/22 1031 07/22/22  1055   ITP Comments Virtual Visit completed. Patient informed on EP and RD appointment and 6 Minute walk test. Patient also informed of patient health questionnaires on My Chart. Patient Verbalizes understanding. Visit diagnosis can be found in [facility] 04/08/2022. Completed 6MWT and gym orientation. Initial ITP created and sent for review to Dr. Emily Filbert, Medical Director. First full day of exercise!  Patient was oriented to gym and equipment including functions, settings, policies, and procedures.  Patient's individual exercise prescription and treatment plan were reviewed.  All starting workloads were established based on the results of the 6 minute walk test done at initial orientation visit.  The plan for exercise progression was also introduced and progression will be customized based on patient's performance and goals. 30 Day review completed. Medical Director ITP review done, changes made as directed, and signed approval by Medical Director.   NEW TO PROGRAM 30 Day review completed. Medical Director ITP review done, changes made as directed, and signed approval by Medical Director.    [place] Name 08/19/22 1122 09/16/22 1055         ITP Comments 30 Day review completed. Medical Director ITP review done, changes made as directed, and signed approval by Medical Director. 30 Day review completed. Medical Director ITP review done, changes made as directed, and signed approval by Medical Director.               Comments:

## 2022-09-17 ENCOUNTER — Encounter: Payer: BC Managed Care – PPO | Admitting: *Deleted

## 2022-09-17 VITALS — Ht 75.6 in | Wt 293.8 lb

## 2022-09-17 DIAGNOSIS — I5042 Chronic combined systolic (congestive) and diastolic (congestive) heart failure: Secondary | ICD-10-CM

## 2022-09-17 NOTE — Progress Notes (Signed)
Daily Session Note  Patient Details  Name: RAFAN SANDERS MRN: [id number] Date of Birth: [birth year] Referring Provider:   Flowsheet Row Cardiac Rehab from 06/22/2022 in [facility] Cardiac and Pulmonary Rehab  Referring Provider Lujean Amel MD       Encounter Date: 09/17/2022  Check In:  Session Check In - 09/17/22 0930       Check-In   Supervising physician immediately available to respond to emergencies See telemetry face sheet for immediately available ER MD    Location [facility]-Cardiac & Pulmonary Rehab    Staff Present Darlyne Russian, RN, Lorin Mercy, MS, ACSM CEP, Exercise Physiologist;Joseph Tessie Fass, [place]    Virtual Visit No    Medication changes reported     No    Fall or balance concerns reported    No    Warm-up and Cool-down Performed on first and last piece of equipment    Resistance Training Performed Yes    VAD Patient? No    PAD/SET Patient? No      Pain Assessment   Currently in Pain? No/denies                Social History   Tobacco Use  Smoking Status Former   Packs/day: 1.00   Years: 45.00   Total pack years: 45.00   Types: Cigarettes   Quit date: 07/24/2021   Years since quitting: 1.1  Smokeless Tobacco Never    Goals Met:  Independence with exercise equipment Exercise tolerated well No report of concerns or symptoms today Strength training completed today  Goals Unmet:  Not Applicable  Comments: Pt able to follow exercise prescription today without complaint.  Will continue to monitor for progression.    Dr. Emily Filbert is Medical Director for [place].  Dr. Ottie Glazier is Medical Director for [facility] Pul[facility].

## 2022-09-17 NOTE — Patient Instructions (Addendum)
Discharge Patient Instructions  Patient Details  Name: Mason Frank MRN: [id number] Date of Birth: [birth year] Referring Provider:  No ref. provider found   Number of Visits: 36  Reason for Discharge:  Patient reached a stable level of exercise. Patient independent in their exercise. Patient has met program and personal goals.  Smoking History:  Social History   Tobacco Use  Smoking Status Former   Packs/day: 1.00   Years: 45.00   Total pack years: 45.00   Types: Cigarettes   Quit date: 07/24/2021   Years since quitting: 1.1  Smokeless Tobacco Never    Diagnosis:  Heart failure, systolic and diastolic, chronic ([place])  Initial Exercise Prescription:  Initial Exercise Prescription - 06/22/22 1200       Date of Initial Exercise RX and Referring Provider   Date 06/22/22    Referring Provider Lujean Amel MD      Oxygen   Maintain Oxygen Saturation 88% or higher      Treadmill   MPH 1.8    Grade 0.5    Minutes 15    METs 2.5      Recumbant Bike   Level 2    RPM 50    Watts 32    Minutes 15    METs 2.5      NuStep   Level 2    SPM 80    Minutes 15    METs 2.5      Track   Laps 26    Minutes 15    METs 2.41      Prescription Details   Frequency (times per week) 3    Duration Progress to 30 minutes of continuous aerobic without signs/symptoms of physical distress      Intensity   THRR 40-80% of Max Heartrate 115-144    Ratings of Perceived Exertion 11-13    Perceived Dyspnea 0-4      Progression   Progression Continue to progress workloads to maintain intensity without signs/symptoms of physical distress.      Resistance Training   Training Prescription Yes    Weight 4 lb    Reps 10-15             Discharge Exercise Prescription (Final Exercise Prescription Changes):  Exercise Prescription Changes - 09/17/22 1300       Response to Exercise   Blood Pressure (Admit) 134/58    Blood Pressure (Exit) 112/60    Heart Rate (Admit) 75  bpm    Heart Rate (Exercise) 85 bpm    Heart Rate (Exit) 77 bpm    Rating of Perceived Exertion (Exercise) 14    Symptoms none    Duration Continue with 30 min of aerobic exercise without signs/symptoms of physical distress.    Intensity THRR unchanged      Progression   Progression Continue to progress workloads to maintain intensity without signs/symptoms of physical distress.    Average METs 2.85      Resistance Training   Training Prescription Yes    Weight 5 lb    Reps 10-15      Interval Training   Interval Training No      Recumbant Bike   Level 7    Watts 32    Minutes 15    METs 2.73      NuStep   Level 6    Minutes 15    METs 3.2      T5 Nustep   Level 4    Minutes 15  Track   Laps 20    Minutes 15    METs 2.09      Home Exercise Plan   Plans to continue exercise at Home (comment)    Frequency Add 2 additional days to program exercise sessions.    Initial Home Exercises Provided 09/08/22      Oxygen   Maintain Oxygen Saturation 88% or higher             Functional Capacity:  6 Minute Walk     Row Name 06/22/22 1244 09/17/22 1027       6 Minute Walk   Phase Initial Discharge    Distance 1045 feet 1170 feet    Distance % Change -- 11.9 %    Distance Feet Change -- 125 ft    Walk Time 6 minutes 6 minutes    # of Rest Breaks 0 0    MPH 1.98 2.21    METS 2.76 2.61    RPE 13 14    Perceived Dyspnea  2 2    VO2 Peak 9.67 9.14    Symptoms Yes (comment) Yes (comment)    Comments SOB SOB, hip pain 3/10    Resting HR 87 bpm 72 bpm    Resting BP 126/64 104/60    Resting Oxygen Saturation  97 % 94 %    Exercise Oxygen Saturation  during 6 min walk 97 % 94 %    Max Ex. HR 132 bpm 98 bpm    Max Ex. BP 148/74 136/58    2 Minute Post BP 134/72 --             Nutrition & Weight - Outcomes:  Pre Biometrics - 06/22/22 1251       Pre Biometrics   Height 6' 3.6" (1.92 m)    Weight 304 lb 1.6 oz (137.9 kg)    Waist Circumference 45  inches    Hip Circumference 48 inches    Waist to Hip Ratio 0.94 %    BMI (Calculated) 37.42    Single Leg Stand 8.8 seconds             Post Biometrics - 09/17/22 1026        Post  Biometrics   Height 6' 3.6" (1.92 m)    Weight 293 lb 12.8 oz (133.3 kg)    Waist Circumference 48.5 inches    Hip Circumference 49 inches    Waist to Hip Ratio 0.99 %    BMI (Calculated) 36.15    Single Leg Stand 30 seconds             Nutrition:  Nutrition Therapy & Goals - 06/22/22 0912       Nutrition Therapy   Diet Heart healthy, low Na    Drug/Food Interactions Statins/Certain Fruits    Protein (specify units) 100g    Fiber 33 grams    Whole Grain Foods 3 servings    Saturated Fats 18 max. grams    Fruits and Vegetables 8 servings/day    Sodium 1.5 grams      Personal Nutrition Goals   Nutrition Goal ST: review paperwork, consider preparing more meals at home. LT: limit eating out <2-3x/week, include 5-8 servings of fruit/vegetables, increase varitey, limit salt <2g/day    Comments 62 y.o. M admitted to cardiac rehab for heart failure. PMHx inlcudes bilateral pulmonary embolism, cancer, cardiomyopathy, COPD, CAD, HFrEF, HTN, HLD, MI, sleep apnea, gout. Relevant medications includes colchicine, jardiance, crestor, torsemide, trazodone. PYP Score:  70. Vegetables & Fruits 7/12. Breads, Grains & Cereals 11/12. Red & Processed Meat 10/12. Poultry 2/2. Fish & Shellfish 0/4. Beans, Nuts & Seeds 1/4. Milk & Dairy Foods 5/6. Toppings, Oils, Seasonings & Salt 14/20. Sweets, Snacks & Restaurant Food 11/14. Beverages 9/10.    B: cereal (2% milk and raisin bran or kashi) with OJ L: [place] food out to eat (chicken soup) or PB and J sandwich (whole grain natures own) D: sometimes will not eat (will have [place] food) Drinks: water. Josph Macho reports that he like salads as well sometimes during the week. Discussed general heart healthy eating and discussed considerations with eating out (whole grains are  hard to find, limited variety, high fat red meat, usually vegetables are cooked with butter, high in sodium, etc). Suggested cooking more meals at home.      Intervention Plan   Intervention Prescribe, educate and counsel regarding individualized specific dietary modifications aiming towards targeted core components such as weight, hypertension, lipid management, diabetes, heart failure and other comorbidities.;Nutrition handout(s) given to patient.    Expected Outcomes Short Term Goal: Understand basic principles of dietary content, such as calories, fat, sodium, cholesterol and nutrients.;Short Term Goal: A plan has been developed with personal nutrition goals set during dietitian appointment.;Long Term Goal: Adherence to prescribed nutrition plan.             Goals reviewed with patient; copy given to patient.

## 2022-09-22 ENCOUNTER — Encounter: Payer: BC Managed Care – PPO | Admitting: *Deleted

## 2022-09-22 DIAGNOSIS — I5042 Chronic combined systolic (congestive) and diastolic (congestive) heart failure: Secondary | ICD-10-CM

## 2022-09-22 NOTE — Progress Notes (Signed)
Daily Session Note  Patient Details  Name: Mason Frank MRN: [id number] Date of Birth: 04/16/1961 Referring Provider:   Flowsheet Row Cardiac Rehab from 06/22/2022 in [facility] Cardiac and Pulmonary Rehab  Referring Provider Lujean Amel MD       Encounter Date: 09/22/2022  Check In:  Session Check In - 09/22/22 1001       Check-In   Supervising physician immediately available to respond to emergencies See telemetry face sheet for immediately available ER MD    Location [facility]-Cardiac & Pulmonary Rehab    Staff Present Heath Lark, RN, BSN, CCRP;Jessica [place], MA, RCEP, CCRP, CCET;Noah Tickle, BS, Exercise Physiologist;Kara Maricela Bo, MS, ACSM CEP, Exercise Physiologist    Virtual Visit No    Medication changes reported     No    Fall or balance concerns reported    No    Warm-up and Cool-down Performed on first and last piece of equipment    Resistance Training Performed Yes    VAD Patient? No    PAD/SET Patient? No      Pain Assessment   Currently in Pain? No/denies                Social History   Tobacco Use  Smoking Status Former   Packs/day: 1.00   Years: 45.00   Total pack years: 45.00   Types: Cigarettes   Quit date: 07/24/2021   Years since quitting: 1.1  Smokeless Tobacco Never    Goals Met:  Independence with exercise equipment Exercise tolerated well Personal goals reviewed No report of concerns or symptoms today  Goals Unmet:  Not Applicable  Comments:Mason Frank graduated today from  rehab with 36 sessions completed.  Details of the patient's exercise prescription and what He needs to do in order to continue the prescription and progress were discussed with patient.  Patient was given a copy of prescription and goals.  Patient verbalized understanding.  Mason Frank plans to continue to exercise by walking at home.    Dr. Emily Filbert is Medical Director for [place].  Dr. Ottie Glazier is Medical Director for [facility]  Pulmonary Rehabilitation.

## 2022-09-22 NOTE — Progress Notes (Signed)
Cardiac Individual Treatment Plan  Patient Details  Name: Mason Frank MRN: [id number] Date of Birth: April 21, 1961 Referring Provider:   Flowsheet Row Cardiac Rehab from 06/22/2022 in [facility] Cardiac and Pulmonary Rehab  Referring Provider Lujean Amel MD       Initial Encounter Date:  Flowsheet Row Cardiac Rehab from 06/22/2022 in [facility] Cardiac and Pulmonary Rehab  Date 06/22/22       Visit Diagnosis: Heart failure, systolic and diastolic, chronic ([place])  Patient's Home Medications on Admission:  Current Outpatient Medications:    allopurinol (ZYLOPRIM) 100 MG tablet, Take 1 tablet by mouth daily., Disp: , Rfl:    amiodarone (PACERONE) 200 MG tablet, Take by mouth., Disp: , Rfl:    apixaban (ELIQUIS) 5 MG TABS tablet, Take by mouth., Disp: , Rfl:    aspirin EC 81 MG tablet, Take by mouth., Disp: , Rfl:    atorvastatin (LIPITOR) 20 MG tablet, Take by mouth., Disp: , Rfl:    clopidogrel (PLAVIX) 300 MG TABS tablet, Take by mouth., Disp: , Rfl:    colchicine 0.6 MG tablet, Take 0.6 mg by mouth daily as needed (gout flares)., Disp: , Rfl:    colchicine 0.6 MG tablet, Take 2 tablets (1.'2mg'$ ) by mouth at first sign of gout flare followed by 1 tablet (0.'6mg'$ ) after 1 hour. (Max 1.'8mg'$  within 1 hour), Disp: , Rfl:    doxycycline (VIBRAMYCIN) 50 MG capsule, Take 1 capsule (50 mg total) by mouth daily. Take with food. (Evening meal), Disp: 90 capsule, Rfl: 1   empagliflozin (JARDIANCE) 10 MG TABS tablet, Take 1 tablet by mouth daily., Disp: , Rfl:    Fluticasone-Umeclidin-Vilant (TRELEGY ELLIPTA) 100-62.5-25 MCG/ACT AEPB, Inhale into the lungs., Disp: , Rfl:    furosemide (LASIX) 20 MG tablet, Take 20 mg by mouth daily at 12 noon., Disp: , Rfl:    ketoconazole (NIZORAL) 2 % cream, For fungal infection apply to the feet QHS., Disp: 60 g, Rfl: 6   losartan (COZAAR) 100 MG tablet, Take 100 mg by mouth daily at 12 noon., Disp: , Rfl:    metoprolol succinate (TOPROL-XL) 25 MG 24 hr tablet, Take by  mouth daily at 12 noon., Disp: , Rfl:    naproxen sodium (ALEVE) 220 MG tablet, Take 440 mg by mouth daily as needed (pain)., Disp: , Rfl:    potassium chloride (KLOR-CON) 20 MEQ packet, Take 20 mEq by mouth daily at 12 noon., Disp: , Rfl:    rosuvastatin (CRESTOR) 20 MG tablet, Take by mouth., Disp: , Rfl:    sacubitril-valsartan (ENTRESTO) 24-26 MG, Take 1 tablet by mouth 2 (two) times daily., Disp: , Rfl:    spironolactone (ALDACTONE) 25 MG tablet, Take 25 mg by mouth daily., Disp: , Rfl:    torsemide (DEMADEX) 20 MG tablet, Take 20 mg by mouth daily., Disp: , Rfl:    traZODone (DESYREL) 50 MG tablet, Take 50 mg by mouth at bedtime., Disp: , Rfl:   Past Medical History: Past Medical History:  Diagnosis Date   Cancer ([place]) 1990   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   Dysrhythmia    Pulmonary embolism ([place])    Sleep apnea     Tobacco Use: Social History   Tobacco Use  Smoking Status Former   Packs/day: 1.00   Years: 45.00   Total pack years: 45.00   Types: Cigarettes   Quit date: 07/24/2021   Years since quitting: 1.1  Smokeless Tobacco Never    Labs: Review Flowsheet  Latest Ref Rng & Units 10/17/2012  Labs for ITP Cardiac and Pulmonary Rehab  Cholestrol 0 - 200 mg/dL 239   LDL (calc) 0 - 100 mg/dL 164   HDL-C 40 - 60 mg/dL 59   Trlycerides 0 - 200 mg/dL 81      Exercise Target Goals: Exercise Program Goal: Individual exercise prescription set using results from initial 6 min walk test and THRR while considering  patient's activity barriers and safety.   Exercise Prescription Goal: Initial exercise prescription builds to 30-45 minutes a day of aerobic activity, 2-3 days per week.  Home exercise guidelines will be given to patient during program as part of exercise prescription that the participant will acknowledge.   Education: Aerobic Exercise: - Group verbal and visual presentation on the components of exercise prescription. Introduces F.I.T.T  principle from ACSM for exercise prescriptions.  Reviews F.I.T.T. principles of aerobic exercise including progression. Written material given at graduation. Flowsheet Row Cardiac Rehab from 09/17/2022 in [facility] Cardiac and Pulmonary Rehab  Date 09/10/22  Educator [facility]  Instruction Review Code 1- Verbalizes Understanding       Education: Resistance Exercise: - Group verbal and visual presentation on the components of exercise prescription. Introduces F.I.T.T principle from ACSM for exercise prescriptions  Reviews F.I.T.T. principles of resistance exercise including progression. Written material given at graduation. Flowsheet Row Cardiac Rehab from 09/17/2022 in [facility] Cardiac and Pulmonary Rehab  Date 09/17/22  Educator [facility]  Instruction Review Code 1- Verbalizes Understanding        Education: Exercise & Equipment Safety: - Individual verbal instruction and demonstration of equipment use and safety with use of the equipment. Flowsheet Row Cardiac Rehab from 09/17/2022 in [facility] Cardiac and Pulmonary Rehab  Date 06/15/22  Educator [facility]  Instruction Review Code 1- Verbalizes Understanding       Education: Exercise Physiology & General Exercise Guidelines: - Group verbal and written instruction with models to review the exercise physiology of the cardiovascular system and associated critical values. Provides general exercise guidelines with specific guidelines to those with heart or lung disease.  Flowsheet Row Cardiac Rehab from 09/17/2022 in [facility] Cardiac and Pulmonary Rehab  Date 09/03/22  Educator [facility]  Instruction Review Code 1- Verbalizes Understanding       Education: Flexibility, Balance, Mind/Body Relaxation: - Group verbal and visual presentation with interactive activity on the components of exercise prescription. Introduces F.I.T.T principle from ACSM for exercise prescriptions. Reviews F.I.T.T. principles of flexibility and balance exercise training including progression. Also  discusses the mind body connection.  Reviews various relaxation techniques to help reduce and manage stress (i.e. Deep breathing, progressive muscle relaxation, and visualization). Balance handout provided to take home. Written material given at graduation. Flowsheet Row Cardiac Rehab from 09/17/2022 in [facility] Cardiac and Pulmonary Rehab  Date 09/17/22  Educator [facility]  Instruction Review Code 1- Verbalizes Understanding       Activity Barriers & Risk Stratification:  Activity Barriers & Cardiac Risk Stratification - 06/22/22 1246       Activity Barriers & Cardiac Risk Stratification   Activity Barriers Shortness of Breath;Decreased Ventricular Function;Balance Concerns;Muscular Weakness;Deconditioning    Cardiac Risk Stratification High             6 Minute Walk:  6 Minute Walk     Row Name 06/22/22 1244 09/17/22 1027       6 Minute Walk   Phase Initial Discharge    Distance 1045 feet 1170 feet    Distance % Change -- 11.9 %  Distance Feet Change -- 125 ft    Walk Time 6 minutes 6 minutes    # of Rest Breaks 0 0    MPH 1.98 2.21    METS 2.76 2.61    RPE 13 14    Perceived Dyspnea  2 2    VO2 Peak 9.67 9.14    Symptoms Yes (comment) Yes (comment)    Comments SOB SOB, hip pain 3/10    Resting HR 87 bpm 72 bpm    Resting BP 126/64 104/60    Resting Oxygen Saturation  97 % 94 %    Exercise Oxygen Saturation  during 6 min walk 97 % 94 %    Max Ex. HR 132 bpm 98 bpm    Max Ex. BP 148/74 136/58    2 Minute Post BP 134/72 --             Oxygen Initial Assessment:   Oxygen Re-Evaluation:   Oxygen Discharge (Final Oxygen Re-Evaluation):   Initial Exercise Prescription:  Initial Exercise Prescription - 06/22/22 1200       Date of Initial Exercise RX and Referring Provider   Date 06/22/22    Referring Provider Lujean Amel MD      Oxygen   Maintain Oxygen Saturation 88% or higher      Treadmill   MPH 1.8    Grade 0.5    Minutes 15    METs 2.5       Recumbant Bike   Level 2    RPM 50    Watts 32    Minutes 15    METs 2.5      NuStep   Level 2    SPM 80    Minutes 15    METs 2.5      Track   Laps 26    Minutes 15    METs 2.41      Prescription Details   Frequency (times per week) 3    Duration Progress to 30 minutes of continuous aerobic without signs/symptoms of physical distress      Intensity   THRR 40-80% of Max Heartrate 115-144    Ratings of Perceived Exertion 11-13    Perceived Dyspnea 0-4      Progression   Progression Continue to progress workloads to maintain intensity without signs/symptoms of physical distress.      Resistance Training   Training Prescription Yes    Weight 4 lb    Reps 10-15             Perform Capillary Blood Glucose checks as needed.  Exercise Prescription Changes:   Exercise Prescription Changes     Row Name 06/22/22 1200 06/30/22 1400 07/13/22 1500 07/30/22 1400 08/10/22 1500     Response to Exercise   Blood Pressure (Admit) 126/64 90/56 124/64 106/64 124/60   Blood Pressure (Exercise) 148/74 124/76 162/60 146/74 --   Blood Pressure (Exit) 134/72 108/66 102/60 112/60 102/60   Heart Rate (Admit) 81 bpm 72 bpm 72 bpm 79 bpm 93 bpm   Heart Rate (Exercise) 132 bpm 148 bpm 122 bpm 98 bpm 105 bpm   Heart Rate (Exit) 96 bpm 125 bpm 98 bpm 60 bpm 75 bpm   Oxygen Saturation (Admit) 97 % -- -- -- --   Oxygen Saturation (Exercise) 97 % -- -- -- --   Rating of Perceived Exertion (Exercise) '13 12 14 12 13   '$ Perceived Dyspnea (Exercise) 2 0 -- -- --   Symptoms SOB None  none none none   Comments walk test results First 2 days of Rehab -- -- --   Duration -- Continue with 30 min of aerobic exercise without signs/symptoms of physical distress. Continue with 30 min of aerobic exercise without signs/symptoms of physical distress. Continue with 30 min of aerobic exercise without signs/symptoms of physical distress. Continue with 30 min of aerobic exercise without signs/symptoms of  physical distress.   Intensity -- THRR unchanged THRR unchanged THRR unchanged THRR unchanged     Progression   Progression -- Continue to progress workloads to maintain intensity without signs/symptoms of physical distress. Continue to progress workloads to maintain intensity without signs/symptoms of physical distress. Continue to progress workloads to maintain intensity without signs/symptoms of physical distress. Continue to progress workloads to maintain intensity without signs/symptoms of physical distress.   Average METs -- 2.15 2.14 2.03 2.28     Resistance Training   Training Prescription -- Yes Yes Yes Yes   Weight -- 4 lb 4 lb 4 lb 5 lb   Reps -- 10-15 10-15 10-15 10-15     Interval Training   Interval Training -- No Yes No No   Equipment -- -- Recumbant Bike -- --   Comments -- -- level 2-8; light rpm -- --     Treadmill   MPH -- 1.8 1.1 1.1 1.4   Grade -- 0.5 1 0.5 1   Minutes -- '15 15 15 15   '$ METs -- 2.5 2 1.92 2.26     Recumbant Bike   Level -- 2.1 2  intervals up to '8 3 4   '$ Watts -- -- -- -- 38   Minutes -- '15 15 15 15   '$ METs -- 1.'8 2 2 '$ 2.86     NuStep   Level -- '2 4 4 '$ --   Minutes -- '15 15 15 '$ --   METs -- 2.2 2.2 2.3 --     REL-XR   Level -- -- -- -- 2   Minutes -- -- -- -- 15     Oxygen   Maintain Oxygen Saturation -- 88% or higher 88% or higher 88% or higher 88% or higher    Row Name 08/26/22 1600 09/07/22 1500 09/08/22 0900 09/17/22 1300       Response to Exercise   Blood Pressure (Admit) 98/60 134/58 -- 134/58    Blood Pressure (Exit) 112/62 112/60 -- 112/60    Heart Rate (Admit) 72 bpm 75 bpm -- 75 bpm    Heart Rate (Exercise) 86 bpm 85 bpm -- 85 bpm    Heart Rate (Exit) 82 bpm 77 bpm -- 77 bpm    Rating of Perceived Exertion (Exercise) 13 14 -- 14    Symptoms none none -- none    Duration Continue with 30 min of aerobic exercise without signs/symptoms of physical distress. Continue with 30 min of aerobic exercise without signs/symptoms of  physical distress. -- Continue with 30 min of aerobic exercise without signs/symptoms of physical distress.    Intensity THRR unchanged THRR unchanged -- THRR unchanged      Progression   Progression Continue to progress workloads to maintain intensity without signs/symptoms of physical distress. Continue to progress workloads to maintain intensity without signs/symptoms of physical distress. -- Continue to progress workloads to maintain intensity without signs/symptoms of physical distress.    Average METs 2.02 2.85 -- 2.85      Resistance Training   Training Prescription Yes Yes -- Yes    Weight 5 lb  5 lb -- 5 lb    Reps 10-15 10-15 -- 10-15      Interval Training   Interval Training No No -- No      Treadmill   MPH 1 -- -- --    Grade 1 -- -- --    Minutes 15 -- -- --    METs 1.9 -- -- --      Recumbant Bike   Level 6 7 -- 7    Watts -- 32 -- 32    Minutes 15 15 -- 15    METs -- 2.73 -- 2.73      NuStep   Level 5 6 -- 6    Minutes 15 15 -- 15    METs 2.3 3.2 -- 3.2      Recumbant Elliptical   Level 3 -- -- --    Minutes 15 -- -- --    METs 2.1 -- -- --      T5 Nustep   Level 4 4 -- 4    Minutes 15 15 -- 15    METs 2.2 -- -- --      Track   Laps -- 20 -- 20    Minutes -- 15 -- 15    METs -- 2.09 -- 2.09      Home Exercise Plan   Plans to continue exercise at -- -- Home (comment) Home (comment)    Frequency -- -- Add 2 additional days to program exercise sessions. Add 2 additional days to program exercise sessions.    Initial Home Exercises Provided -- -- 09/08/22 09/08/22      Oxygen   Maintain Oxygen Saturation 88% or higher 88% or higher -- 88% or higher             Exercise Comments:   Exercise Comments     Row Name 09/22/22 1002           Exercise Comments Josph Macho graduated today from  rehab with 36 sessions completed.  Details of the patient's exercise prescription and what He needs to do in order to continue the prescription and progress were  discussed with patient.  Patient was given a copy of prescription and goals.  Patient verbalized understanding.  [name] plans to continue to exercise by walking at home.                Exercise Goals and Review:   Exercise Goals     Row Name 06/22/22 1250             Exercise Goals   Increase Physical Activity Yes       Intervention Provide advice, education, support and counseling about physical activity/exercise needs.;Develop an individualized exercise prescription for aerobic and resistive training based on initial evaluation findings, risk stratification, comorbidities and participant's personal goals.       Expected Outcomes Short Term: Attend rehab on a regular basis to increase amount of physical activity.;Long Term: Add in home exercise to make exercise part of routine and to increase amount of physical activity.;Long Term: Exercising regularly at least 3-5 days a week.       Increase Strength and Stamina Yes       Intervention Provide advice, education, support and counseling about physical activity/exercise needs.;Develop an individualized exercise prescription for aerobic and resistive training based on initial evaluation findings, risk stratification, comorbidities and participant's personal goals.       Expected Outcomes Short Term: Increase workloads from initial exercise prescription for resistance,  speed, and METs.;Short Term: Perform resistance training exercises routinely during rehab and add in resistance training at home;Long Term: Improve cardiorespiratory fitness, muscular endurance and strength as measured by increased METs and functional capacity (6MWT)       Able to understand and use rate of perceived exertion (RPE) scale Yes       Intervention Provide education and explanation on how to use RPE scale       Expected Outcomes Short Term: Able to use RPE daily in rehab to express subjective intensity level;Long Term:  Able to use RPE to guide intensity level when  exercising independently       Able to understand and use Dyspnea scale Yes       Intervention Provide education and explanation on how to use Dyspnea scale       Expected Outcomes Short Term: Able to use Dyspnea scale daily in rehab to express subjective sense of shortness of breath during exertion;Long Term: Able to use Dyspnea scale to guide intensity level when exercising independently       Knowledge and understanding of Target Heart Rate Range (THRR) Yes       Intervention Provide education and explanation of THRR including how the numbers were predicted and where they are located for reference       Expected Outcomes Short Term: Able to state/look up THRR;Short Term: Able to use daily as guideline for intensity in rehab;Long Term: Able to use THRR to govern intensity when exercising independently       Able to check pulse independently Yes       Intervention Provide education and demonstration on how to check pulse in carotid and radial arteries.;Review the importance of being able to check your own pulse for safety during independent exercise       Expected Outcomes Short Term: Able to explain why pulse checking is important during independent exercise;Long Term: Able to check pulse independently and accurately       Understanding of Exercise Prescription Yes       Intervention Provide education, explanation, and written materials on patient's individual exercise prescription       Expected Outcomes Short Term: Able to explain program exercise prescription;Long Term: Able to explain home exercise prescription to exercise independently                Exercise Goals Re-Evaluation :  Exercise Goals Re-Evaluation     Row Name 06/23/22 1012 06/30/22 0955 06/30/22 1502 07/13/22 1547 07/30/22 1437     Exercise Goal Re-Evaluation   Exercise Goals Review Increase Physical Activity;Able to understand and use rate of perceived exertion (RPE) scale;Knowledge and understanding of Target Heart  Rate Range (THRR);Understanding of Exercise Prescription;Increase Strength and Stamina;Able to check pulse independently Increase Physical Activity;Able to understand and use rate of perceived exertion (RPE) scale;Knowledge and understanding of Target Heart Rate Range (THRR);Understanding of Exercise Prescription;Increase Strength and Stamina;Able to check pulse independently;Able to understand and use Dyspnea scale Increase Physical Activity;Increase Strength and Stamina;Understanding of Exercise Prescription Increase Physical Activity;Increase Strength and Stamina;Understanding of Exercise Prescription Increase Physical Activity;Increase Strength and Stamina;Understanding of Exercise Prescription   Comments Reviewed RPE scale, THR and program prescription with pt today.  Pt voiced understanding and was given a copy of goals to take home. Josph Macho is off to a good start in rehab.  He has not been doing much at home with his afib.  He is going to start walking and trying the staff videos at home. Reviewed home  exercise with pt today.  Pt plans to walking and staff vidoes for exercise.  Reviewed THR, pulse, RPE, sign and symptoms, pulse oximetery and when to call 911 or MD.  Also discussed weather considerations and indoor options.  Pt voiced understanding. Josph Macho is off to a good start in rehab. He had an average MET level of 2.15 METs during his first two exercise sessions. He also tolerated the treadmill at a speed of 1.8 mph and an incline of 0.5%. We will continue to monitor his progress in the program. Josph Macho continues to do well in rehab.  He started doing light intervals on the recumbent bike, varying levels 2-8. He also increased to level 4 on the T4 Nustep. His HR has been better controlled. We will continue to monitor. Josph Macho is doing well in rehab. He has consistently worked at an average of 2 METs. He also has continued to do well on the recumbent bike at level 3 and the T4 at level 4. Josph Macho could benefit from  increasing his workload on the treadmill. We will continue to monitor his progress in the program.   Expected Outcomes Short: Use RPE daily to regulate intensity.  Long: Follow program prescription in THR. Short: Start to add in more exercise at home Long: Conitnue to build stamina Short: Continue to follow current exercise prescription. Long: Continue to increase strength and stamina. Short: Increase speed on treadmill Long: Continue to increase overall MET level Short: Increase speed on treadmill. Long: Continue to increase strength and stamina.    [place] Name 08/07/22 1660 08/10/22 1525 08/26/22 1607 08/27/22 0939 09/07/22 1524     Exercise Goal Re-Evaluation   Exercise Goals Review Increase Physical Activity;Increase Strength and Stamina;Understanding of Exercise Prescription Increase Physical Activity;Increase Strength and Stamina;Understanding of Exercise Prescription Increase Physical Activity;Increase Strength and Stamina;Understanding of Exercise Prescription Increase Physical Activity;Increase Strength and Stamina;Understanding of Exercise Prescription Increase Physical Activity;Increase Strength and Stamina;Understanding of Exercise Prescription   Comments Josph Macho is doing well in rehab.  He is still using his VR system for his exercise routines.  His wife does it too.  His strength is getting a little better, but his stamina is not wear he wants to be.  We talked about going for more walks to help with stamina.  He wore a holter for 24 hours and should get results next week. Josph Macho continues to do well in rehab. He did increase his watts on the recumbent bike which reached 38! He also increase his workload on the treadmill to reach a 1.4 mph/ 1% incline. HR is getting up but not always quite in his THR, though his RPEs are appropriate. Will continue to monitor. Josph Macho continues to do well in rehab. He has continued to work at an average MET level above 2 METs. He has also improved to level 5 on the T4 and  level 6 on the recumbent bike. He began using the recumbent elliptical as well and tolerated level 3. We will continue to monitor his progress in the program. Josph Macho is doing well in rehab.  He has not been doing his home exercise.  It is cold outside and not fun to walk.  We talked about tyring our staff videos or going to the mall to walk inside.  He has started to notice that his strength and stamina are starting to improve some. Josph Macho continues to do well in rehab. He increased to level 7 on the recumbent bike and reached up to 32 watts on the bike!  He also is now working level 6 on the T4 Nustep. He walked the track again in a long time and reached 20 laps. He is due for his post 6WMT soon and hope to see significant improvement.   Expected Outcomes Short: Continue to add in more walking Long: Continue to improve stamina Short: Continue to increase watts on recumbent bike Long: Continue to increase overall MET level Short: Continue to increase workload on the treadmill. Long: Continue to improve strength and stamina. Short: Get into home exercise habit Long: Continue to improve stamina Short: Improve on post 6MWT Long: Continue to increase overall MET level    Row Name 09/08/22 0939             Exercise Goal Re-Evaluation   Exercise Goals Review Increase Physical Activity;Increase Strength and Stamina;Understanding of Exercise Prescription;Able to understand and use rate of perceived exertion (RPE) scale;Knowledge and understanding of Target Heart Rate Range (THRR);Able to understand and use Dyspnea scale;Able to check pulse independently       Comments Reviewed home exercise with pt today.  Pt plans to continue to walk and use VR [employer] game at home for exercise.  Reviewed THR, pulse, RPE, sign and symptoms, pulse oximetery and when to call 911 or MD.  Also discussed weather considerations and indoor options.  Pt voiced understanding.       Expected Outcomes Short: Continue to add in exercise at home  Long: Continue to exercise indpendently                Discharge Exercise Prescription (Final Exercise Prescription Changes):  Exercise Prescription Changes - 09/17/22 1300       Response to Exercise   Blood Pressure (Admit) 134/58    Blood Pressure (Exit) 112/60    Heart Rate (Admit) 75 bpm    Heart Rate (Exercise) 85 bpm    Heart Rate (Exit) 77 bpm    Rating of Perceived Exertion (Exercise) 14    Symptoms none    Duration Continue with 30 min of aerobic exercise without signs/symptoms of physical distress.    Intensity THRR unchanged      Progression   Progression Continue to progress workloads to maintain intensity without signs/symptoms of physical distress.    Average METs 2.85      Resistance Training   Training Prescription Yes    Weight 5 lb    Reps 10-15      Interval Training   Interval Training No      Recumbant Bike   Level 7    Watts 32    Minutes 15    METs 2.73      NuStep   Level 6    Minutes 15    METs 3.2      T5 Nustep   Level 4    Minutes 15      Track   Laps 20    Minutes 15    METs 2.09      Home Exercise Plan   Plans to continue exercise at Home (comment)    Frequency Add 2 additional days to program exercise sessions.    Initial Home Exercises Provided 09/08/22      Oxygen   Maintain Oxygen Saturation 88% or higher             Nutrition:  Target Goals: Understanding of nutrition guidelines, daily intake of sodium '1500mg'$ , cholesterol '200mg'$ , calories 30% from fat and 7% or less from saturated fats, daily to have 5 or  more servings of fruits and vegetables.  Education: All About Nutrition: -Group instruction provided by verbal, written material, interactive activities, discussions, models, and posters to present general guidelines for heart healthy nutrition including fat, fiber, MyPlate, the role of sodium in heart healthy nutrition, utilization of the nutrition label, and utilization of this knowledge for meal planning.  Follow up email sent as well. Written material given at graduation. Flowsheet Row Cardiac Rehab from 09/17/2022 in [facility] Cardiac and Pulmonary Rehab  Date 07/23/22  Educator [facility]  Instruction Review Code 1- Verbalizes Understanding       Biometrics:  Pre Biometrics - 06/22/22 1251       Pre Biometrics   Height 6' 3.6" (1.92 m)    Weight 304 lb 1.6 oz (137.9 kg)    Waist Circumference 45 inches    Hip Circumference 48 inches    Waist to Hip Ratio 0.94 %    BMI (Calculated) 37.42    Single Leg Stand 8.8 seconds             Post Biometrics - 09/17/22 1026        Post  Biometrics   Height 6' 3.6" (1.92 m)    Weight 293 lb 12.8 oz (133.3 kg)    Waist Circumference 48.5 inches    Hip Circumference 49 inches    Waist to Hip Ratio 0.99 %    BMI (Calculated) 36.15    Single Leg Stand 30 seconds             Nutrition Therapy Plan and Nutrition Goals:  Nutrition Therapy & Goals - 06/22/22 0912       Nutrition Therapy   Diet Heart healthy, low Na    Drug/Food Interactions Statins/Certain Fruits    Protein (specify units) 100g    Fiber 33 grams    Whole Grain Foods 3 servings    Saturated Fats 18 max. grams    Fruits and Vegetables 8 servings/day    Sodium 1.5 grams      Personal Nutrition Goals   Nutrition Goal ST: review paperwork, consider preparing more meals at home. LT: limit eating out <2-3x/week, include 5-8 servings of fruit/vegetables, increase varitey, limit salt <2g/day    Comments 62 y.o. M admitted to cardiac rehab for heart failure. PMHx inlcudes bilateral pulmonary embolism, cancer, cardiomyopathy, COPD, CAD, HFrEF, HTN, HLD, MI, sleep apnea, gout. Relevant medications includes colchicine, jardiance, crestor, torsemide, trazodone. PYP Score: 70. Vegetables & Fruits 7/12. Breads, Grains & Cereals 11/12. Red & Processed Meat 10/12. Poultry 2/2. Fish & Shellfish 0/4. Beans, Nuts & Seeds 1/4. Milk & Dairy Foods 5/6. Toppings, Oils, Seasonings & Salt 14/20.  Sweets, Snacks & Restaurant Food 11/14. Beverages 9/10.    B: cereal (2% milk and raisin bran or kashi) with OJ L: [place] food out to eat (chicken soup) or PB and J sandwich (whole grain natures own) D: sometimes will not eat (will have [place] food) Drinks: water. Josph Macho reports that he like salads as well sometimes during the week. Discussed general heart healthy eating and discussed considerations with eating out (whole grains are hard to find, limited variety, high fat red meat, usually vegetables are cooked with butter, high in sodium, etc). Suggested cooking more meals at home.      Intervention Plan   Intervention Prescribe, educate and counsel regarding individualized specific dietary modifications aiming towards targeted core components such as weight, hypertension, lipid management, diabetes, heart failure and other comorbidities.;Nutrition handout(s) given to patient.    Expected  Outcomes Short Term Goal: Understand basic principles of dietary content, such as calories, fat, sodium, cholesterol and nutrients.;Short Term Goal: A plan has been developed with personal nutrition goals set during dietitian appointment.;Long Term Goal: Adherence to prescribed nutrition plan.             Nutrition Assessments:  MEDIFICTS Score Key: ?70 Need to make dietary changes  40-70 Heart Healthy Diet ? 40 Therapeutic Level Cholesterol Diet  Flowsheet Row Cardiac Rehab from 09/22/2022 in [facility] Cardiac and Pulmonary Rehab  Picture Your Plate Total Score on Admission 70  Picture Your Plate Total Score on Discharge 62      Picture Your Plate Scores: <69 Unhealthy dietary pattern with much room for improvement. 41-50 Dietary pattern unlikely to meet recommendations for good health and room for improvement. 51-60 More healthful dietary pattern, with some room for improvement.  >60 Healthy dietary pattern, although there may be some specific behaviors that could be improved.    Nutrition Goals  Re-Evaluation:  Nutrition Goals Re-Evaluation     [place] Name 06/30/22 0959 08/07/22 0932 08/27/22 0943         Goals   Nutrition Goal ST: review paperwork, consider preparing more meals at home. LT: limit eating out <2-3x/week, include 5-8 servings of fruit/vegetables, increase varitey, limit salt <2g/day Short: Continue to add in more fruits and vegetables. Long; Continue to add variety Short: Continue to add in more fruits and vegetables LOng: continue to add in more variety and enough protein.     Comment Josph Macho is doing well in rehab. He is trying to do better about making meals at home. He has not been going out as much.  He is trying to add in more fruit and vegetables, but not quite enough yet.  He is adding fruit to his cereal to help.  He is watching his sodium and doing better with it.  he is thinking about getting a juicer to help. Josph Macho is doing better with his diet.  He has cut back on his salt.  He is cooking more at home!!  He has had some not tasted so good.  We talked about trying some different Mrs. Dash seasoning.  He is trying to add more fruits in morning on cereal or cottage cheese. Josph Macho is doing well with his diet. He continues to cook more and has cut back on his salt.  He has stopped eating out as much and avoids fried foods.  He is still working on adding in more fruit and vegetables still.  He is eating a few more salads now.     Expected Outcome Short: Continue to add in more fruits and vegetables.  Long; Continue to add variety Short: Continue to add in more fruits and vegetables LOng: continue to add in more variety and enough protein. Short: Conitnue to cook at home Long: Continue to add more fruit and vegetables              Nutrition Goals Discharge (Final Nutrition Goals Re-Evaluation):  Nutrition Goals Re-Evaluation - 08/27/22 0943       Goals   Nutrition Goal Short: Continue to add in more fruits and vegetables LOng: continue to add in more variety and enough  protein.    Comment Josph Macho is doing well with his diet. He continues to cook more and has cut back on his salt.  He has stopped eating out as much and avoids fried foods.  He is still working on adding in more fruit  and vegetables still.  He is eating a few more salads now.    Expected Outcome Short: Conitnue to cook at home Long: Continue to add more fruit and vegetables             Psychosocial: Target Goals: Acknowledge presence or absence of significant depression and/or stress, maximize coping skills, provide positive support system. Participant is able to verbalize types and ability to use techniques and skills needed for reducing stress and depression.   Education: Stress, Anxiety, and Depression - Group verbal and visual presentation to define topics covered.  Reviews how body is impacted by stress, anxiety, and depression.  Also discusses healthy ways to reduce stress and to treat/manage anxiety and depression.  Written material given at graduation. Flowsheet Row Cardiac Rehab from 09/17/2022 in [facility] Cardiac and Pulmonary Rehab  Date 08/27/22  Educator [facility]  Instruction Review Code 1- [employer] Understanding       Education: Sleep Hygiene -Provides group verbal and written instruction about how sleep can affect your health.  Define sleep hygiene, discuss sleep cycles and impact of sleep habits. Review good sleep hygiene tips.    Initial Review & Psychosocial Screening:  Initial Psych Review & Screening - 06/15/22 1012       Initial Review   Current issues with Current Stress Concerns    Source of Stress Concerns Occupation    Comments He has lost his job recently and is doing ok with being out of work. He is ready to get his health back in order so he can work a less demanding job. He does not take any medicaions for his mood.      Family Dynamics   Good Support System? Yes    Comments He can look to his wife for support, sisters and brother. His two daughters are nearby  also.      Barriers   Psychosocial barriers to participate in program The patient should benefit from training in stress management and relaxation.;There are no identifiable barriers or psychosocial needs.      Screening Interventions   Interventions Encouraged to exercise;To provide support and resources with identified psychosocial needs;Provide feedback about the scores to participant    Expected Outcomes Short Term goal: Utilizing psychosocial counselor, staff and physician to assist with identification of specific Stressors or current issues interfering with healing process. Setting desired goal for each stressor or current issue identified.;Long Term Goal: Stressors or current issues are controlled or eliminated.;Short Term goal: Identification and review with participant of any Quality of Life or Depression concerns found by scoring the questionnaire.;Long Term goal: The participant improves quality of Life and PHQ9 Scores as seen by post scores and/or verbalization of changes             Quality of Life Scores:   Quality of Life - 09/22/22 0931       Quality of Life Scores   Health/Function Pre 20.07 %    Health/Function Post 21.27 %    Health/Function % Change 5.98 %    Socioeconomic Pre 21.5 %    Socioeconomic Post 21.71 %    Socioeconomic % Change  0.98 %    Psych/Spiritual Pre 22.86 %    Psych/Spiritual Post 23.57 %    Psych/Spiritual % Change 3.11 %    Family Pre 24 %    Family Post 24 %    Family % Change 0 %    GLOBAL Pre 21.51 %    GLOBAL Post 22.24 %  GLOBAL % Change 3.39 %            Scores of 19 and below usually indicate a poorer quality of life in these areas.  A difference of  2-3 points is a clinically meaningful difference.  A difference of 2-3 points in the total score of the Quality of Life Index has been associated with significant improvement in overall quality of life, self-image, physical symptoms, and general health in studies assessing change  in quality of life.  PHQ-9: Review Flowsheet       09/22/2022 06/22/2022 06/15/2019 04/26/2019  Depression screen PHQ 2/9  Decreased Interest 0 0 0 1  Down, Depressed, Hopeless 0 0 0 0  PHQ - 2 Score 0 0 0 1  Altered sleeping 1 1 0 0  Tired, decreased energy '1 1 1 1  '$ Change in appetite '1 1 1 '$ 0  Feeling bad or failure about yourself  0 0 0 0  Trouble concentrating 0 0 0 0  Moving slowly or fidgety/restless 0 0 0 0  Suicidal thoughts 0 0 0 0  PHQ-9 Score '3 3 2 2  '$ Difficult doing work/chores Not difficult at all Not difficult at all Not difficult at all Not difficult at all   Interpretation of Total Score  Total Score Depression Severity:  1-4 = Minimal depression, 5-9 = Mild depression, 10-14 = Moderate depression, 15-19 = Moderately severe depression, 20-27 = Severe depression   Psychosocial Evaluation and Intervention:  Psychosocial Evaluation - 06/15/22 1015       Psychosocial Evaluation & Interventions   Interventions Encouraged to exercise with the program and follow exercise prescription;Relaxation education;Stress management education    Comments He has lost his job recently and is doing ok with being out of work. He is ready to get his health back in order so he can work a less demanding job. He does not take any medicaions for his mood.    Expected Outcomes Short: Start HeartTrack to help with mood. Long: Maintain a healthy mental state    Continue Psychosocial Services  Follow up required by staff             Psychosocial Re-Evaluation:  Psychosocial Re-Evaluation     Row Name 06/30/22 0956 08/07/22 0930 08/27/22 0941         Psychosocial Re-Evaluation   Current issues with Current Stress Concerns Current Stress Concerns Current Stress Concerns;Current Sleep Concerns     Comments Josph Macho is doing well in rehab.  He denies any major stressors currently.  He is currently out of work, but finances have been okay.  His wife is worried, but he feels good.  He is  sleeping fairly well when he is able to sleep.  He has trouble getting to sleep.  He has trazadone to help as melatonin did not help.  We talked about taking the right does for him. Josph Macho is doing well in rehab.  He loss a friend yesterday and is coping with his loss.  He is sleeping a little better.  His health is his biggest stressors.  His dog continues to bring him joy and laughter every day. Josph Macho is doing well in rehab.  He denies any current stressors.  He is doing okay with sleep.  He struggles with getting to sleep.  We talked about trying melatonin again and setting up room for bedtime and turning off screens prior to sleep.     Expected Outcomes Shrot: Continue to work on sleep Long; Conitnue to  exercise for mental boost Short: Continue to work on sleep Long: Continue to exercise for mental boost Short: Try to turn off screens at night to help fall asleep Long: Continue to stay positive     Interventions Encouraged to attend Cardiac Rehabilitation for the exercise Encouraged to attend Cardiac Rehabilitation for the exercise Encouraged to attend Cardiac Rehabilitation for the exercise;Stress management education     Continue Psychosocial Services  -- Follow up required by staff Follow up required by staff              Psychosocial Discharge (Final Psychosocial Re-Evaluation):  Psychosocial Re-Evaluation - 08/27/22 0941       Psychosocial Re-Evaluation   Current issues with Current Stress Concerns;Current Sleep Concerns    Comments Josph Macho is doing well in rehab.  He denies any current stressors.  He is doing okay with sleep.  He struggles with getting to sleep.  We talked about trying melatonin again and setting up room for bedtime and turning off screens prior to sleep.    Expected Outcomes Short: Try to turn off screens at night to help fall asleep Long: Continue to stay positive    Interventions Encouraged to attend Cardiac Rehabilitation for the exercise;Stress management education     Continue Psychosocial Services  Follow up required by staff             Vocational Rehabilitation: Provide vocational rehab assistance to qualifying candidates.   Vocational Rehab Evaluation & Intervention:  Vocational Rehab - 09/22/22 0934       Discharge Vocational Rehab   Discharge Vocational Rehabilitation no VR required             Education: Education Goals: Education classes will be provided on a variety of topics geared toward better understanding of heart health and risk factor modification. Participant will state understanding/return demonstration of topics presented as noted by education test scores.  Learning Barriers/Preferences:  Learning Barriers/Preferences - 06/15/22 1012       Learning Barriers/Preferences   Learning Barriers None    Learning Preferences None             General Cardiac Education Topics:  AED/CPR: - Group verbal and written instruction with the use of models to demonstrate the basic use of the AED with the basic ABC's of resuscitation.   Anatomy and Cardiac Procedures: - Group verbal and visual presentation and models provide information about basic cardiac anatomy and function. Reviews the testing methods done to diagnose heart disease and the outcomes of the test results. Describes the treatment choices: Medical Management, Angioplasty, or Coronary Bypass Surgery for treating various heart conditions including Myocardial Infarction, Angina, Valve Disease, and Cardiac Arrhythmias.  Written material given at graduation. Flowsheet Row Cardiac Rehab from 09/17/2022 in [facility] Cardiac and Pulmonary Rehab  Education need identified 06/22/22  Date 07/30/22  Educator SB  Instruction Review Code 1- Verbalizes Understanding       Medication Safety: - Group verbal and visual instruction to review commonly prescribed medications for heart and lung disease. Reviews the medication, class of the drug, and side effects. Includes the steps to  properly store meds and maintain the prescription regimen.  Written material given at graduation.   Intimacy: - Group verbal instruction through game format to discuss how heart and lung disease can affect sexual intimacy. Written material given at graduation.Arn Medal Row Cardiac Rehab from 09/17/2022 in [facility] Cardiac and Pulmonary Rehab  Date 09/10/22  Educator [facility]  Instruction Review Code 1- Verbalizes Understanding  Know Your Numbers and Heart Failure: - Group verbal and visual instruction to discuss disease risk factors for cardiac and pulmonary disease and treatment options.  Reviews associated critical values for Overweight/Obesity, Hypertension, Cholesterol, and Diabetes.  Discusses basics of heart failure: signs/symptoms and treatments.  Introduces Heart Failure Zone chart for action plan for heart failure.  Written material given at graduation.   Infection Prevention: - Provides verbal and written material to individual with discussion of infection control including proper hand washing and proper equipment cleaning during exercise session. Flowsheet Row Cardiac Rehab from 09/17/2022 in [facility] Cardiac and Pulmonary Rehab  Date 06/15/22  Educator [facility]  Instruction Review Code 1- Verbalizes Understanding       Falls Prevention: - Provides verbal and written material to individual with discussion of falls prevention and safety. Flowsheet Row Cardiac Rehab from 09/17/2022 in [facility] Cardiac and Pulmonary Rehab  Date 06/15/22  Educator [facility]  Instruction Review Code 1- Verbalizes Understanding       Other: -Provides group and verbal instruction on various topics (see comments)   Knowledge Questionnaire Score:  Knowledge Questionnaire Score - 09/22/22 0934       Knowledge Questionnaire Score   Pre Score 24/26    Post Score 22/26  responses reviewed  verbalized understanding             Core Components/Risk Factors/Patient Goals at Admission:  Personal Goals and Risk  Factors at Admission - 06/22/22 1253       Core Components/Risk Factors/Patient Goals on Admission    Weight Management Yes;Weight Loss;Obesity    Intervention Weight Management/Obesity: Establish reasonable short term and long term weight goals.;Weight Management: Develop a combined nutrition and exercise program designed to reach desired caloric intake, while maintaining appropriate intake of nutrient and fiber, sodium and fats, and appropriate energy expenditure required for the weight goal.;Weight Management: Provide education and appropriate resources to help participant work on and attain dietary goals.;Obesity: Provide education and appropriate resources to help participant work on and attain dietary goals.    Admit Weight 304 lb 1.6 oz (137.9 kg)    Goal Weight: Short Term 299 lb (135.6 kg)    Goal Weight: Long Term 269 lb (122 kg)    Expected Outcomes Short Term: Continue to assess and modify interventions until short term weight is achieved;Long Term: Adherence to nutrition and physical activity/exercise program aimed toward attainment of established weight goal;Weight Loss: Understanding of general recommendations for a balanced deficit meal plan, which promotes 1-2 lb weight loss per week and includes a negative energy balance of [phone] kcal/d;Understanding recommendations for meals to include 15-35% energy as protein, 25-35% energy from fat, 35-60% energy from carbohydrates, less than '200mg'$  of dietary cholesterol, 20-35 gm of total fiber daily;Understanding of distribution of calorie intake throughout the day with the consumption of 4-5 meals/snacks    Heart Failure Yes    Intervention Provide a combined exercise and nutrition program that is supplemented with education, support and counseling about heart failure. Directed toward relieving symptoms such as shortness of breath, decreased exercise tolerance, and extremity edema.    Expected Outcomes Improve functional capacity of life;Short  term: Attendance in program 2-3 days a week with increased exercise capacity. Reported lower sodium intake. Reported increased fruit and vegetable intake. Reports medication compliance.;Short term: Daily weights obtained and reported for increase. Utilizing diuretic protocols set by physician.;Long term: Adoption of self-care skills and reduction of barriers for early signs and symptoms recognition and intervention leading to self-care maintenance.  Hypertension Yes    Intervention Provide education on lifestyle modifcations including regular physical activity/exercise, weight management, moderate sodium restriction and increased consumption of fresh fruit, vegetables, and low fat dairy, alcohol moderation, and smoking cessation.;Monitor prescription use compliance.    Expected Outcomes Short Term: Continued assessment and intervention until BP is < 140/25m HG in hypertensive participants. < 130/879mHG in hypertensive participants with diabetes, heart failure or chronic kidney disease.;Long Term: Maintenance of blood pressure at goal levels.    Lipids Yes    Intervention Provide education and support for participant on nutrition & aerobic/resistive exercise along with prescribed medications to achieve LDL '70mg'$ , HDL >'40mg'$ .    Expected Outcomes Short Term: Participant states understanding of desired cholesterol values and is compliant with medications prescribed. Participant is following exercise prescription and nutrition guidelines.;Long Term: Cholesterol controlled with medications as prescribed, with individualized exercise RX and with personalized nutrition plan. Value goals: LDL < '70mg'$ , HDL > 40 mg.             Education:Diabetes - Individual verbal and written instruction to review signs/symptoms of diabetes, desired ranges of glucose level fasting, after meals and with exercise. Acknowledge that pre and post exercise glucose checks will be done for 3 sessions at entry of program.   Core  Components/Risk Factors/Patient Goals Review:   Goals and Risk Factor Review     Row Name 06/30/22 1001 08/07/22 0928 08/27/22 0945         Core Components/Risk Factors/Patient Goals Review   Personal Goals Review Weight Management/Obesity;Lipids;Hypertension;Heart Failure Weight Management/Obesity;Lipids;Hypertension;Heart Failure Weight Management/Obesity;Lipids;Hypertension;Heart Failure     Review FrJosph Machos doing well in rehab. His weight is trending down. He can almost see his feet again. His pressures have been running low in rehab. He is not checking them at home so we talked about getting back to the habit of checking again.  He has not had any heart failure symptoms other than fatigue and SOB with activity, but that is why he is back in rehab.  FrJosph Machos going to add in mroe exercise at home. [name] wore a heart monitor this week and will get results next week.  His pressures are doing well in class.  He has not had any heart failure sysmptoms.  He continues to work on weight loss but it has been stable. FrJosph Machos doing well in rehab. His weight has been steady.  He would like to lose more so we talked about adding in more exercise and being more active in the day.  He is doing well about taking his lasix. His pressures are doing well.  He denies any current heart failure symptoms as well.  His heart monitor showed that he was still doing well and did not need a pacemaker yet.     Expected Outcomes Short: Continue to work on weight loss and adding variety Long; Conitnue to monitor risk factors Short: Continue to work on weight loss and get results of heart monitor Long: Continue to monitor risk factors Short: Add in more exercise to help with weight loss Long; Conitnue to monitor risk factors              Core Components/Risk Factors/Patient Goals at Discharge (Final Review):   Goals and Risk Factor Review - 08/27/22 0945       Core Components/Risk Factors/Patient Goals Review   Personal  Goals Review Weight Management/Obesity;Lipids;Hypertension;Heart Failure    Review FrJosph Machos doing well in rehab. His weight has been steady.  He  would like to lose more so we talked about adding in more exercise and being more active in the day.  He is doing well about taking his lasix. His pressures are doing well.  He denies any current heart failure symptoms as well.  His heart monitor showed that he was still doing well and did not need a pacemaker yet.    Expected Outcomes Short: Add in more exercise to help with weight loss Long; Conitnue to monitor risk factors             ITP Comments:  ITP Comments     Row Name 06/15/22 1010 06/22/22 1242 06/23/22 1012 06/24/22 1031 07/22/22 1055   ITP Comments Virtual Visit completed. Patient informed on EP and RD appointment and 6 Minute walk test. Patient also informed of patient health questionnaires on My Chart. Patient Verbalizes understanding. Visit diagnosis can be found in [facility] 04/08/2022. Completed 6MWT and gym orientation. Initial ITP created and sent for review to Dr. Emily Filbert, Medical Director. First full day of exercise!  Patient was oriented to gym and equipment including functions, settings, policies, and procedures.  Patient's individual exercise prescription and treatment plan were reviewed.  All starting workloads were established based on the results of the 6 minute walk test done at initial orientation visit.  The plan for exercise progression was also introduced and progression will be customized based on patient's performance and goals. 30 Day review completed. Medical Director ITP review done, changes made as directed, and signed approval by Medical Director.   NEW TO PROGRAM 30 Day review completed. Medical Director ITP review done, changes made as directed, and signed approval by Medical Director.    [place] Name 08/19/22 1122 09/16/22 1055 09/22/22 1002       ITP Comments 30 Day review completed. Medical Director ITP review done,  changes made as directed, and signed approval by Medical Director. 30 Day review completed. Medical Director ITP review done, changes made as directed, and signed approval by Medical Director. [name] graduated today from  rehab with 36 sessions completed.  Details of the patient's exercise prescription and what He needs to do in order to continue the prescription and progress were discussed with patient.  Patient was given a copy of prescription and goals.  Patient verbalized understanding.  [name] plans to continue to exercise by walking at home.              Comments: Discharge ITP

## 2022-09-22 NOTE — Progress Notes (Signed)
Discharge Note    [name] DOB Feb 21, 1961  Mason Frank graduated today from  rehab with 36 sessions completed.  Details of the patient's exercise prescription and what He needs to do in order to continue the prescription and progress were discussed with patient.  Patient was given a copy of prescription and goals.  Patient verbalized understanding.  [name] plans to continue to exercise by walking at home.   [place] Name 06/22/22 1244 09/17/22 1027       6 Minute Walk   Phase Initial Discharge    Distance 1045 feet 1170 feet    Distance % Change -- 11.9 %    Distance Feet Change -- 125 ft    Walk Time 6 minutes 6 minutes    # of Rest Breaks 0 0    MPH 1.98 2.21    METS 2.76 2.61    RPE 13 14    Perceived Dyspnea  2 2    VO2 Peak 9.67 9.14    Symptoms Yes (comment) Yes (comment)    Comments SOB SOB, hip pain 3/10    Resting HR 87 bpm 72 bpm    Resting BP 126/64 104/60    Resting Oxygen Saturation  97 % 94 %    Exercise Oxygen Saturation  during 6 min walk 97 % 94 %    Max Ex. HR 132 bpm 98 bpm    Max Ex. BP 148/74 136/58    2 Minute Post BP 134/72 --            Thank you for the referral.  We enjoyed working with Mason Frank

## 2023-03-03 ENCOUNTER — Ambulatory Visit (INDEPENDENT_AMBULATORY_CARE_PROVIDER_SITE_OTHER): Payer: BC Managed Care – PPO | Admitting: Dermatology

## 2023-03-03 VITALS — BP 139/75

## 2023-03-03 DIAGNOSIS — L711 Rhinophyma: Secondary | ICD-10-CM

## 2023-03-03 DIAGNOSIS — L719 Rosacea, unspecified: Secondary | ICD-10-CM

## 2023-03-03 DIAGNOSIS — L732 Hidradenitis suppurativa: Secondary | ICD-10-CM

## 2023-03-03 DIAGNOSIS — Z79899 Other long term (current) drug therapy: Secondary | ICD-10-CM

## 2023-03-03 DIAGNOSIS — Z7189 Other specified counseling: Secondary | ICD-10-CM | POA: Diagnosis not present

## 2023-03-03 MED ORDER — ISOTRETINOIN 20 MG PO CAPS: 20.0000 mg | ORAL_CAPSULE | Freq: Every day | ORAL | 0 refills | Status: AC

## 2023-03-03 NOTE — Progress Notes (Signed)
Follow-Up Visit   Subjective  Mason Frank is a 62 y.o. male who presents for the following: Rosacea f/u, face SM triple cream not using, needs another prescription, Doxycycline 50mg  1 po qd, HS pubic area, 30m f/u, Doxycycline 50mg  1 po qd, check spot on leg  The following portions of the chart were reviewed this encounter and updated as appropriate: medications, allergies, medical history  Review of Systems:  No other skin or systemic complaints except as noted in HPI or Assessment and Plan.  Objective  Well appearing patient in no apparent distress; mood and affect are within normal limits. A focused examination was performed of the following areas: Face, groin Relevant exam findings are noted in the Assessment and Plan.   Assessment & Plan   HIDRADENITIS SUPPURATIVA Supra pubic area, buttocks Exam: cystic pap buttocks Chronic and persistent condition with duration or expected duration over one year. Condition is symptomatic / bothersome to patient. Not to goal.  Hidradenitis Suppurativa is a chronic; persistent; non-curable, but treatable condition due to abnormal inflamed sweat glands in the body folds (axilla, inframammary, groin, medial thighs), causing recurrent painful draining cysts and scarring. It can be associated with severe scarring acne and cysts; also abscesses and scarring of scalp. The goal is control and prevention of flares, as it is not curable. Scars are permanent and can be thickened. Treatment may include daily use of topical medication and oral antibiotics.  Oral isotretinoin may also be helpful.  For some cases, Humira or Cosentyx (biologic injections) may be prescribed to decrease the inflammatory process and prevent flares.  When indicated, inflamed cysts may also be treated surgically.  Treatment Plan: Labs from 11/11/22 viewed D/c Doxycycline In 2 days start Isotretinoin 20mg  1 po qd with fatty meal  IPLEDGE consent signed Pt registered in [facility]  program [facility] # [id number] Pharmacy: [facility]  For cyst on buttocks discussed excising  Discussed Isotretinoin, Humira, Cosentyx  Isotretinoin Counseling; Review and Contraception Counseling: Reviewed potential side effects of isotretinoin including xerosis, cheilitis, hepatitis, hyperlipidemia, and severe birth defects if taken by a pregnant woman.  Women on isotretinoin must be celibate (not having sex) or required to use at least 2 birth control methods to prevent pregnancy (unless patient is a male of non-child bearing potential).  Females of child-bearing potential must have monthly pregnancy tests while on isotretinoin and report through [employer] (FDA monitoring program). Reviewed reports of suicidal ideation in those with a history of depression while taking isotretinoin and reports of diagnosis of inflammatory bowl disease (IBD) while taking isotretinoin as well as the lack of evidence for a causal relationship between isotretinoin, depression and IBD. Patient advised to reach out with any questions or concerns. Patient advised not to share pills or donate blood while on treatment or for one month after completing treatment. All patient's considering Isotretinoin must read and understand and sign Isotretinoin Consent Form and be registered with [employer].   ROSACEA with RHINOPHYMA Face, nose Exam: rhinophyma nose, no active paps face, "sebaceous" skin of entire face Chronic and persistent condition with duration or expected duration over one year. Condition is symptomatic / bothersome to patient. Not to goal.  Rosacea is a chronic progressive skin condition usually affecting the face of adults, causing redness and/or acne bumps. It is treatable but not curable. It sometimes affects the eyes (ocular rosacea) as well. It may respond to topical and/or systemic medication and can flare with stress, sun exposure, alcohol, exercise, topical steroids (including  hydrocortisone/cortisone 10)  and some foods.  Daily application of broad spectrum spf 30+ sunscreen to face is recommended to reduce flares.  Treatment Plan D/c SM Triple Cream D/c Doxycycline In 2 days start Isotretinoin 20mg  1 po qd with fatty meal  Discussed Isotretinoin  Return in about 30 days (around 04/02/2023) for Isotretinoin f/u.  I, Ardis Rowan, RMA, am acting as scribe for Armida Sans, MD .  Documentation: I have reviewed the above documentation for accuracy and completeness, and I agree with the above.  Armida Sans, MD

## 2023-03-03 NOTE — Patient Instructions (Addendum)
Instructions for [facility] Medications  One or more of your medications was sent to the [facility] mail order compounding pharmacy. You will receive an email from them and can purchase the medicine through that link. It will then be mailed to your home at the address you confirmed. If for any reason you do not receive an email from them, please check your spam folder. If you still do not find the email, please let us know. [facility] phone number is [phone].       Due to recent changes in healthcare laws, you may see results of your pathology and/or laboratory studies on MyChart before the doctors have had a chance to review them. We understand that in some cases there may be results that are confusing or concerning to you. Please understand that not all results are received at the same time and often the doctors may need to interpret multiple results in order to provide you with the best plan of care or course of treatment. Therefore, we ask that you please give us 2 business days to thoroughly review all your results before contacting the office for clarification. Should we see a critical lab result, you will be contacted sooner.   If You Need Anything After Your Visit  If you have any questions or concerns for your doctor, please call our main line at [phone] and press option 4 to reach your doctor's medical assistant. If no one answers, please leave a voicemail as directed and we will return your call as soon as possible. Messages left after 4 pm will be answered the following business day.   You may also send us a message via MyChart. We typically respond to MyChart messages within 1-2 business days.  For prescription refills, please ask your pharmacy to contact our office. Our fax number is [phone].  If you have an urgent issue when the clinic is closed that cannot wait until the next business day, you can page your doctor at the number below.    Please note  that while we do our best to be available for urgent issues outside of office hours, we are not available 24/7.   If you have an urgent issue and are unable to reach us, you may choose to seek medical care at your doctor's office, retail clinic, urgent care center, or emergency room.  If you have a medical emergency, please immediately call 911 or go to the emergency department.  Pager Numbers  - Dr. Kowalski: [phone]  - Dr. Moye: [phone]  - Dr. Stewart: [phone]  In the event of inclement weather, please call our main line at [phone] for an update on the status of any delays or closures.  Dermatology Medication Tips: Please keep the boxes that topical medications come in in order to help keep track of the instructions about where and how to use these. Pharmacies typically print the medication instructions only on the boxes and not directly on the medication tubes.   If your medication is too expensive, please contact our office at [phone] option 4 or send us a message through MyChart.   We are unable to tell what your co-pay for medications will be in advance as this is different depending on your insurance coverage. However, we may be able to find a substitute medication at lower cost or fill out paperwork to get insurance to cover a needed medication.   If a prior authorization is required to get your medication covered by your insurance   company, please allow us 1-2 business days to complete this process.  Drug prices often vary depending on where the prescription is filled and some pharmacies may offer cheaper prices.  The website www.goodrx.com contains coupons for medications through different pharmacies. The prices here do not account for what the cost may be with help from insurance (it may be cheaper with your insurance), but the website can give you the price if you did not use any insurance.  - You can print the associated coupon and take it with your  prescription to the pharmacy.  - You may also stop by our office during regular business hours and pick up a GoodRx coupon card.  - If you need your prescription sent electronically to a different pharmacy, notify our office through [facility] MyChart or by phone at [phone] option 4.     Si Usted Necesita Algo Despus de Su Visita  Tambin puede enviarnos un mensaje a travs de MyChart. Por lo general respondemos a los mensajes de MyChart en el transcurso de 1 a 2 das hbiles.  Para renovar recetas, por favor pida a su farmacia que se ponga en contacto con nuestra oficina. Nuestro nmero de fax es el [phone].  Si tiene un asunto urgente cuando la clnica est cerrada y que no puede esperar hasta el siguiente da hbil, puede llamar/localizar a su doctor(a) al nmero que aparece a continuacin.   Por favor, tenga en cuenta que aunque hacemos todo lo posible para estar disponibles para asuntos urgentes fuera del horario de oficina, no estamos disponibles las 24 horas del da, los 7 das de la semana.   Si tiene un problema urgente y no puede comunicarse con nosotros, puede optar por buscar atencin mdica  en el consultorio de su doctor(a), en una clnica privada, en un centro de atencin urgente o en una sala de emergencias.  Si tiene una emergencia mdica, por favor llame inmediatamente al 911 o vaya a la sala de emergencias.  Nmeros de bper  - Dr. Kowalski: [phone]  - Dra. Moye: [phone]  - Dra. Stewart: [phone]  En caso de inclemencias del tiempo, por favor llame a nuestra lnea principal al [phone] para una actualizacin sobre el estado de cualquier retraso o cierre.  Consejos para la medicacin en dermatologa: Por favor, guarde las cajas en las que vienen los medicamentos de uso tpico para ayudarle a seguir las instrucciones sobre dnde y cmo usarlos. Las farmacias generalmente imprimen las instrucciones del medicamento slo en las cajas y no  directamente en los tubos del medicamento.   Si su medicamento es muy caro, por favor, pngase en contacto con nuestra oficina llamando al [phone] y presione la opcin 4 o envenos un mensaje a travs de MyChart.   No podemos decirle cul ser su copago por los medicamentos por adelantado ya que esto es diferente dependiendo de la cobertura de su seguro. Sin embargo, es posible que podamos encontrar un medicamento sustituto a menor costo o llenar un formulario para que el seguro cubra el medicamento que se considera necesario.   Si se requiere una autorizacin previa para que su compaa de seguros cubra su medicamento, por favor permtanos de 1 a 2 das hbiles para completar este proceso.  Los precios de los medicamentos varan con frecuencia dependiendo del lugar de dnde se surte la receta y alguna farmacias pueden ofrecer precios ms baratos.  El sitio web www.goodrx.com tiene cupones para medicamentos de diferentes farmacias. Los precios aqu no tienen en cuenta   lo que podra costar con la ayuda del seguro (puede ser ms barato con su seguro), pero el sitio web puede darle el precio si no utiliz ningn seguro.  - Puede imprimir el cupn correspondiente y llevarlo con su receta a la farmacia.  - Tambin puede pasar por nuestra oficina durante el horario de atencin regular y recoger una tarjeta de cupones de GoodRx.  - Si necesita que su receta se enve electrnicamente a una farmacia diferente, informe a nuestra oficina a travs de MyChart de [facility] o por telfono llamando al [phone] y presione la opcin 4.  

## 2023-03-05 ENCOUNTER — Encounter: Payer: Self-pay | Admitting: Dermatology

## 2023-03-08 ENCOUNTER — Other Ambulatory Visit: Payer: Self-pay | Admitting: Dermatology

## 2023-04-07 ENCOUNTER — Ambulatory Visit: Payer: BC Managed Care – PPO | Admitting: Dermatology

## 2023-05-04 ENCOUNTER — Encounter: Payer: Self-pay | Admitting: Internal Medicine

## 2023-05-05 ENCOUNTER — Encounter: Payer: Self-pay | Admitting: Anesthesiology

## 2023-05-05 ENCOUNTER — Ambulatory Visit
Admission: RE | Admit: 2023-05-05 | Discharge: 2023-05-05 | Disposition: A | Discharge status: Home / Self Care | Payer: BC Managed Care – PPO | Attending: Internal Medicine | Admitting: Internal Medicine

## 2023-05-05 ENCOUNTER — Encounter
Admission: RE | Disposition: A | Discharge status: Home / Self Care | Payer: Self-pay | Source: Home / Self Care | Attending: Internal Medicine

## 2023-05-05 ENCOUNTER — Encounter: Payer: Self-pay | Admitting: Internal Medicine

## 2023-05-05 DIAGNOSIS — Z7902 Long term (current) use of antithrombotics/antiplatelets: Secondary | ICD-10-CM | POA: Diagnosis not present

## 2023-05-05 DIAGNOSIS — Z86010 Personal history of colonic polyps: Secondary | ICD-10-CM | POA: Insufficient documentation

## 2023-05-05 DIAGNOSIS — Z539 Procedure and treatment not carried out, unspecified reason: Secondary | ICD-10-CM | POA: Insufficient documentation

## 2023-05-05 DIAGNOSIS — Z09 Encounter for follow-up examination after completed treatment for conditions other than malignant neoplasm: Secondary | ICD-10-CM | POA: Insufficient documentation

## 2023-05-05 HISTORY — PX: COLONOSCOPY WITH PROPOFOL: SHX5780

## 2023-05-05 SURGERY — COLONOSCOPY WITH PROPOFOL
Anesthesia: General

## 2023-05-05 MED ORDER — SODIUM CHLORIDE 0.9 % IV SOLN: INTRAVENOUS | Status: DC

## 2023-05-05 NOTE — Anesthesia Preprocedure Evaluation (Signed)
Anesthesia Evaluation  Patient identified by MRN, date of birth, ID band Patient awake    Reviewed: Allergy & Precautions, NPO status , Patient's Chart, lab work & pertinent test results  History of Anesthesia Complications Negative for: history of anesthetic complications  Airway Mallampati: III  TM Distance: >3 FB Neck ROM: full    Dental no notable dental hx.    Pulmonary sleep apnea , COPD, former smoker   Pulmonary exam normal        Cardiovascular hypertension, On Medications and On Home Beta Blockers + CAD and +CHF  + dysrhythmias (s/p ablation) Atrial Fibrillation   Echo  MODERATE LEFT VENTRICULAR SYSTOLIC DYSFUNCTION WITH MILD LVH  NORMAL LA PRESSURES WITH DIASTOLIC DYSFUNCTION  NORMAL RIGHT VENTRICULAR SYSTOLIC FUNCTION  VALVULAR REGURGITATION: No AR, MILD MR, No PR, TRIVIAL TR  VALVULAR STENOSIS: NO VALVULAR STENOSIS  EF 35%   Neuro/Psych negative neurological ROS  negative psych ROS   GI/Hepatic negative GI ROS, Neg liver ROS,,,  Endo/Other  negative endocrine ROS    Renal/GU negative Renal ROS  negative genitourinary   Musculoskeletal   Abdominal   Peds  Hematology negative hematology ROS (+) Hx of PE on eliquis     Anesthesia Other Findings Past Medical History: 1990: Cancer (HCC)     Comment:  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 No date: Dysrhythmia No date: Pulmonary embolism (HCC) No date: Sleep apnea  Past Surgical History: 03/12/2022: CARDIOVERSION; N/A     Comment:  Procedure: CARDIOVERSION;  Surgeon: Alwyn Pea,               MD;  Location: [facility] ORS;  Service: Cardiovascular;                Laterality: N/A; No date: COLONOSCOPY 02/15/2019: RIGHT/LEFT HEART CATH AND CORONARY ANGIOGRAPHY; N/A     Comment:  Procedure: RIGHT/LEFT HEART CATH AND CORONARY               ANGIOGRAPHY;  Surgeon: Alwyn Pea, MD;  Location:              [facility] INVASIVE CV LAB;  Service:  Cardiovascular;                Laterality: N/A;  BMI    Body Mass Index: 38.05 kg/m      Reproductive/Obstetrics negative OB ROS                             Anesthesia Physical Anesthesia Plan  ASA: 3  Anesthesia Plan: General   Post-op Pain Management: Minimal or no pain anticipated   Induction: Intravenous  PONV Risk Score and Plan: 1 and Propofol infusion and TIVA  Airway Management Planned: Natural Airway and Nasal Cannula  Additional Equipment:   Intra-op Plan:   Post-operative Plan:   Informed Consent: I have reviewed the patients History and Physical, chart, labs and discussed the procedure including the risks, benefits and alternatives for the proposed anesthesia with the patient or authorized representative who has indicated his/her understanding and acceptance.     Dental Advisory Given  Plan Discussed with: Anesthesiologist, CRNA and Surgeon  Anesthesia Plan Comments: (Patient consented for risks of anesthesia including but not limited to:  - adverse reactions to medications - risk of airway placement if required - damage to eyes, teeth, lips or other oral mucosa - nerve damage due to positioning  - sore throat or hoarseness - Damage to  heart, brain, nerves, lungs, other parts of body or loss of life  Patient voiced understanding.)        Anesthesia Quick Evaluation

## 2023-05-05 NOTE — H&P (Signed)
Outpatient short stay form Pre-procedure 05/05/2023 1:24 PM Mason Frank K. Norma Fredrickson, M.D.  Primary Physician: N/A  Reason for visit:  Personal history of polyps  History of present illness:  Patient rescheduled procedure today due to NOT holding Plavix for 5 days. Procedure was not performed.    Current Facility-Administered Medications:    0.9 %  sodium chloride infusion, , Intravenous, Continuous, Kyri Shader, Boykin Nearing, MD  Current Outpatient Medications:    aspirin EC 81 MG tablet, Take by mouth., Disp: , Rfl:    atorvastatin (LIPITOR) 20 MG tablet, Take by mouth., Disp: , Rfl:    clopidogrel (PLAVIX) 300 MG TABS tablet, Take by mouth., Disp: , Rfl:    empagliflozin (JARDIANCE) 10 MG TABS tablet, Take 1 tablet by mouth daily., Disp: , Rfl:    furosemide (LASIX) 20 MG tablet, Take 20 mg by mouth daily at 12 noon., Disp: , Rfl:    metoprolol succinate (TOPROL-XL) 25 MG 24 hr tablet, Take by mouth daily at 12 noon., Disp: , Rfl:    rosuvastatin (CRESTOR) 20 MG tablet, Take by mouth., Disp: , Rfl:    sacubitril-valsartan (ENTRESTO) 24-26 MG, Take 1 tablet by mouth 2 (two) times daily., Disp: , Rfl:    spironolactone (ALDACTONE) 25 MG tablet, Take 25 mg by mouth daily., Disp: , Rfl:    torsemide (DEMADEX) 20 MG tablet, Take 20 mg by mouth daily., Disp: , Rfl:    allopurinol (ZYLOPRIM) 100 MG tablet, Take 1 tablet by mouth daily. (Patient not taking: Reported on 03/03/2023), Disp: , Rfl:    amiodarone (PACERONE) 200 MG tablet, Take by mouth. (Patient not taking: Reported on 03/03/2023), Disp: , Rfl:    apixaban (ELIQUIS) 5 MG TABS tablet, Take by mouth., Disp: , Rfl:    colchicine 0.6 MG tablet, Take 0.6 mg by mouth daily as needed (gout flares)., Disp: , Rfl:    colchicine 0.6 MG tablet, Take 2 tablets (1.2mg ) by mouth at first sign of gout flare followed by 1 tablet (0.6mg ) after 1 hour. (Max 1.8mg  within 1 hour), Disp: , Rfl:    Fluticasone-Umeclidin-Vilant (TRELEGY ELLIPTA) 100-62.5-25 MCG/ACT  AEPB, Inhale into the lungs., Disp: , Rfl:    ketoconazole (NIZORAL) 2 % cream, For fungal infection apply to the feet QHS., Disp: 60 g, Rfl: 6   losartan (COZAAR) 100 MG tablet, Take 100 mg by mouth daily at 12 noon. (Patient not taking: Reported on 03/03/2023), Disp: , Rfl:    naproxen sodium (ALEVE) 220 MG tablet, Take 440 mg by mouth daily as needed (pain)., Disp: , Rfl:    potassium chloride (KLOR-CON) 20 MEQ packet, Take 20 mEq by mouth daily at 12 noon., Disp: , Rfl:    traZODone (DESYREL) 50 MG tablet, Take 50 mg by mouth at bedtime., Disp: , Rfl:   No medications prior to admission.     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    Physical Exam  Gen: Alert, oriented. Appears stated age.  HEENT: /AT. PERRLA. Lungs: CTA, no wheezes. CV: RR nl S1, S2. Abd: soft, benign, no masses. BS+ Ext: No edema. Pulses 2+    Planned procedures: Reschedule colonoscopy after holding Plavix x 5 or more days.  [employer]  K. Norma Fredrickson, M.D. Gastroenterology 05/05/2023  1:24 PM

## 2023-05-05 NOTE — Progress Notes (Addendum)
Mason Frank took his Plavix last night (last dose of Eliquis per patient was Sunday 05/02/2023). Dr. Norma Fredrickson has been notified and gave the option to proceed with colonoscopy with the understanding that an additional colonoscopy would have to be performed if large polyps were found today, or to reschedule. Mr. Mason Frank elected to reschedule the colonoscopy.

## 2023-05-06 ENCOUNTER — Encounter: Payer: Self-pay | Admitting: Internal Medicine

## 2023-06-16 ENCOUNTER — Ambulatory Visit: Admit: 2023-06-16 | Payer: BC Managed Care – PPO | Admitting: Internal Medicine

## 2023-06-16 SURGERY — COLONOSCOPY WITH PROPOFOL
Anesthesia: General

## 2024-02-07 ENCOUNTER — Inpatient Hospital Stay: Attending: Oncology | Admitting: Oncology

## 2024-02-07 ENCOUNTER — Encounter: Payer: Self-pay | Admitting: Oncology

## 2024-02-07 ENCOUNTER — Inpatient Hospital Stay

## 2024-02-07 VITALS — BP 117/73 | HR 76 | Temp 98.3°F | Resp 16 | Ht 75.0 in | Wt 316.0 lb

## 2024-02-07 DIAGNOSIS — Z87891 Personal history of nicotine dependence: Secondary | ICD-10-CM | POA: Insufficient documentation

## 2024-02-07 DIAGNOSIS — D72829 Elevated white blood cell count, unspecified: Secondary | ICD-10-CM

## 2024-02-07 LAB — CBC WITH DIFFERENTIAL/PLATELET
Abs Immature Granulocytes: 0.05 10*3/uL (ref 0.00–0.07)
Basophils Absolute: 0.1 10*3/uL (ref 0.0–0.1)
Basophils Relative: 1 %
Eosinophils Absolute: 0.2 10*3/uL (ref 0.0–0.5)
Eosinophils Relative: 2 %
HCT: 50.3 % (ref 39.0–52.0)
Hemoglobin: 16.5 g/dL (ref 13.0–17.0)
Immature Granulocytes: 1 %
Lymphocytes Relative: 26 %
Lymphs Abs: 2.9 10*3/uL (ref 0.7–4.0)
MCH: 31.7 pg (ref 26.0–34.0)
MCHC: 32.8 g/dL (ref 30.0–36.0)
MCV: 96.7 fL (ref 80.0–100.0)
Monocytes Absolute: 1.3 10*3/uL — ABNORMAL HIGH (ref 0.1–1.0)
Monocytes Relative: 12 %
Neutro Abs: 6.5 10*3/uL (ref 1.7–7.7)
Neutrophils Relative %: 58 %
Platelets: 267 10*3/uL (ref 150–400)
RBC: 5.2 MIL/uL (ref 4.22–5.81)
RDW: 13.2 % (ref 11.5–15.5)
WBC: 11.1 10*3/uL — ABNORMAL HIGH (ref 4.0–10.5)
nRBC: 0 % (ref 0.0–0.2)

## 2024-02-07 NOTE — Progress Notes (Signed)
 [facility]  [phone] [phone]) [phone]  ID: Mason Frank OB: March 09, 1961  MR#: [id number]  QIO#:[id number]  Patient Care Team: Pcp, [name], MD as Consulting Physician (Oncology)  CHIEF COMPLAINT: Leukocytosis.  INTERVAL HISTORY: Patient is a 63 year old male with a history of longstanding leukocytosis who is referred for further evaluation.  He currently feels well and is asymptomatic.  He has no neurologic complaints.  He denies any recent fevers or illnesses.  He has good appetite and denies weight loss.  He has no chest pain, shortness of breath, cough, or hemoptysis.  He denies any nausea, vomiting, constipation, or diarrhea.  He has no urinary complaints.  Patient feels at his baseline and offers no specific complaints today.  REVIEW OF SYSTEMS:   Review of Systems  Constitutional: Negative.  Negative for fever, malaise/fatigue and weight loss.  Respiratory: Negative.  Negative for cough, hemoptysis and shortness of breath.   Cardiovascular: Negative.  Negative for chest pain and leg swelling.  Gastrointestinal: Negative.  Negative for abdominal pain.  Genitourinary: Negative.  Negative for dysuria.  Musculoskeletal: Negative.  Negative for back pain.  Skin: Negative.  Negative for rash.  Neurological: Negative.  Negative for dizziness, focal weakness, weakness and headaches.  Psychiatric/Behavioral: Negative.  The patient is not nervous/anxious.     As per HPI. Otherwise, a complete review of systems is negative.  PAST MEDICAL HISTORY: Past Medical History:  Diagnosis Date   Cancer (HCC) 1990   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   Dysrhythmia    Pulmonary embolism (HCC)    Sleep apnea     PAST SURGICAL HISTORY: Past Surgical History:  Procedure Laterality Date   CARDIOVERSION N/A 03/12/2022   Procedure: CARDIOVERSION;  Surgeon: Antonette Batters, MD;  Location: [facility] ORS;  Service:  Cardiovascular;  Laterality: N/A;   COLONOSCOPY     COLONOSCOPY WITH PROPOFOL  N/A 05/05/2023   Procedure: COLONOSCOPY WITH PROPOFOL ;  Surgeon: Toledo, Alphonsus Jeans, MD;  Location: [facility] ENDOSCOPY;  Service: Gastroenterology;  Laterality: N/A;   RIGHT/LEFT HEART CATH AND CORONARY ANGIOGRAPHY N/A 02/15/2019   Procedure: RIGHT/LEFT HEART CATH AND CORONARY ANGIOGRAPHY;  Surgeon: Antonette Batters, MD;  Location: [facility] INV[facility]IVE CV [facility];  Service: Cardiovascular;  Laterality: N/A;    FAMILY HISTORY: History reviewed. No pertinent family history.  ADVANCED DIRECTIVES (Y/N):  N  HEALTH MAINTENANCE: Social History   Tobacco Use   Smoking status: Former    Current packs/day: 0.00    Average packs/day: 1 pack/day for 45.0 years (45.0 ttl pk-yrs)    Types: Cigarettes    Start date: 07/24/1976    Quit date: 07/24/2021    Years since quitting: 2.5   Smokeless tobacco: Never  Vaping Use   Vaping status: Never Used  Substance Use Topics   Alcohol use: Not Currently    Alcohol/week: 2.0 standard drinks of alcohol    Types: 2 Shots of liquor per week    Comment: gin   Drug use: Never     Colonoscopy:  PAP:  Bone density:  Lipid panel:  Allergies  Allergen Reactions   Ace Inhibitors Cough    Current Outpatient Medications  Medication Sig Dispense Refill   apixaban (ELIQUIS) 5 MG TABS tablet Take by mouth.     aspirin  EC 81 MG tablet Take by mouth.     colchicine 0.6 MG tablet Take 0.6 mg by mouth daily as needed (gout flares).     colchicine  0.6 MG tablet Take 2 tablets (1.2mg ) by mouth at first sign of gout flare followed by 1 tablet (0.6mg ) after 1 hour. (Max 1.8mg  within 1 hour)     empagliflozin (JARDIANCE) 10 MG TABS tablet Take 1 tablet by mouth daily.     Fluticasone-Umeclidin-Vilant (TRELEGY ELLIPTA) 100-62.5-25 MCG/ACT AEPB Inhale into the lungs.     ketoconazole  (NIZORAL ) 2 % cream For fungal infection apply to the feet QHS. 60 g 6   metoprolol succinate (TOPROL-XL) 25 MG 24 hr  tablet Take by mouth daily at 12 noon.     rosuvastatin (CRESTOR) 20 MG tablet Take by mouth.     sacubitril-valsartan (ENTRESTO) 24-26 MG Take 1 tablet by mouth 2 (two) times daily.     spironolactone (ALDACTONE) 25 MG tablet Take 25 mg by mouth daily.     torsemide (DEMADEX) 20 MG tablet Take 20 mg by mouth daily.     traZODone (DESYREL) 50 MG tablet Take 50 mg by mouth at bedtime.     allopurinol (ZYLOPRIM) 100 MG tablet Take 1 tablet by mouth daily. (Patient not taking: Reported on 03/03/2023)     amiodarone (PACERONE) 200 MG tablet Take by mouth. (Patient not taking: Reported on 03/03/2023)     atorvastatin (LIPITOR) 20 MG tablet Take by mouth.     clopidogrel (PLAVIX) 300 MG TABS tablet Take by mouth. (Patient not taking: Reported on 05/05/2023)     furosemide  (LASIX ) 20 MG tablet Take 20 mg by mouth daily at 12 noon.     losartan (COZAAR) 100 MG tablet Take 100 mg by mouth daily at 12 noon. (Patient not taking: Reported on 03/03/2023)     naproxen sodium (ALEVE) 220 MG tablet Take 440 mg by mouth daily as needed (pain).     potassium chloride (KLOR-CON) 20 MEQ packet Take 20 mEq by mouth daily at 12 noon.     No current facility-administered medications for this visit.    OBJECTIVE: Vitals:   02/07/24 1125  BP: 117/73  Pulse: 76  Resp: 16  Temp: 98.3 F (36.8 C)  SpO2: 97%     Body mass index is 39.5 kg/m.    ECOG FS:0 - Asymptomatic  General: Well-developed, well-nourished, no acute distress. Eyes: Pink conjunctiva, anicteric sclera. HEENT: Normocephalic, moist mucous membranes. Lungs: No audible wheezing or coughing. Heart: Regular rate and rhythm. Abdomen: Soft, nontender, no obvious distention. Musculoskeletal: No edema, cyanosis, or clubbing. Neuro: Alert, answering all questions appropriately. Cranial nerves grossly intact. Skin: No rashes or petechiae noted. Psych: Normal affect. Lymphatics: No cervical, calvicular, axillary or inguinal LAD.   LAB RESULTS:  Lab  Results  Component Value Date   NA 140 10/16/2012   K 3.9 10/16/2012   CL 110 (H) 10/16/2012   CO2 22 10/16/2012   GLUCOSE 99 10/16/2012   BUN 17 10/16/2012   CREATININE 0.96 06/07/2013   CALCIUM 8.3 (L) 10/16/2012   GFRNONAA >60 06/07/2013   GFRAA >60 06/07/2013    Lab Results  Component Value Date   WBC 11.1 (H) 02/07/2024   NEUTROABS 6.5 02/07/2024   HGB 16.5 02/07/2024   HCT 50.3 02/07/2024   MCV 96.7 02/07/2024   PLT 267 02/07/2024     STUDIES: No results found.  ASSESSMENT: Leukocytosis:  PLAN:    Leukocytosis: Chronic and unchanged.  Upon review of patient's chart, his total white blood cell count has remained chronically elevated since March 2014 ranging between 9.1 and 19.2.  Today's result is 11.1.  Peripheral blood flow  cytometry and BCR-ABL mutation were ordered for completeness.  No intervention is needed at this time.  Patient does not require bone marrow biopsy.  Patient will have a video-assisted telemedicine visit in 3 weeks to discuss the results.  I spent a total of 45 minutes reviewing chart data, face-to-face evaluation with the patient, counseling and coordination of care as detailed above.   Patient expressed understanding and was in agreement with this plan. He also understands that He can call clinic at any time with any questions, concerns, or complaints.    Shellie Dials, MD   02/07/2024 5:20 PM

## 2024-02-10 LAB — COMP PANEL: LEUKEMIA/LYMPHOMA

## 2024-02-11 LAB — BCR-ABL1, CML/ALL, PCR, QUANT
E1A2 Transcript: <0.0032 %
Interpretation (BCRAL):: NEGATIVE
b2a2 transcript: <0.0032 %
b3a2 transcript: <0.0032 %

## 2024-02-28 ENCOUNTER — Inpatient Hospital Stay: Attending: Oncology | Admitting: Oncology

## 2024-02-28 DIAGNOSIS — D72829 Elevated white blood cell count, unspecified: Secondary | ICD-10-CM

## 2024-02-28 NOTE — Progress Notes (Signed)
 [facility]  Telephone:(336) [phone] Fax:(336) [phone]  ID: Mason Frank OB: Mar 22, 1961  MR#: [id number]  RDW#:[id number]  Patient Care Team: Pcp, No as PCP - General Jacobo Evalene PARAS, MD as Consulting Physician (Oncology)  I connected with Mason Frank on 02/28/24 at  1:15 PM EDT by video enabled telemedicine visit and verified that I am speaking with the correct person using two identifiers.   I discussed the limitations, risks, security and privacy concerns of performing an evaluation and management service by telemedicine and the availability of in-person appointments. I also discussed with the patient that there may be a patient responsible charge related to this service. The patient expressed understanding and agreed to proceed.   Other persons participating in the visit and their role in the encounter: Patient, MD.  Patient's location: Home. Provider's location: Clinic.  CHIEF COMPLAINT: Leukocytosis.  INTERVAL HISTORY: Patient agreed to video-assisted telemedicine visit for further evaluation and discussion of his laboratory results.  He continues to feel well and remains asymptomatic. He has no neurologic complaints.  He denies any recent fevers or illnesses.  He has a good appetite and denies weight loss.  He has no chest pain, shortness of breath, cough, or hemoptysis.  He denies any nausea, vomiting, constipation, or diarrhea.  He has no urinary complaints.  Patient offers no specific complaints today.  REVIEW OF SYSTEMS:   Review of Systems  Constitutional: Negative.  Negative for fever, malaise/fatigue and weight loss.  Respiratory: Negative.  Negative for cough, hemoptysis and shortness of breath.   Cardiovascular: Negative.  Negative for chest pain and leg swelling.  Gastrointestinal: Negative.  Negative for abdominal pain.  Genitourinary: Negative.  Negative for dysuria.  Musculoskeletal: Negative.  Negative for back pain.  Skin: Negative.   Negative for rash.  Neurological: Negative.  Negative for dizziness, focal weakness, weakness and headaches.  Psychiatric/Behavioral: Negative.  The patient is not nervous/anxious.     As per HPI. Otherwise, a complete review of systems is negative.  PAST MEDICAL HISTORY: Past Medical History:  Diagnosis Date   Cancer (HCC) 1990   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   Dysrhythmia    Pulmonary embolism (HCC)    Sleep apnea     PAST SURGICAL HISTORY: Past Surgical History:  Procedure Laterality Date   CARDIOVERSION N/A 03/12/2022   Procedure: CARDIOVERSION;  Surgeon: Florencio Cara BIRCH, MD;  Location: [facility] ORS;  Service: Cardiovascular;  Laterality: N/A;   COLONOSCOPY     COLONOSCOPY WITH PROPOFOL  N/A 05/05/2023   Procedure: COLONOSCOPY WITH PROPOFOL ;  Surgeon: Toledo, Ladell POUR, MD;  Location: [facility] ENDOS[facility];  Service: Gastroenterology;  Laterality: N/A;   RIGHT/LEFT HEART CATH AND CORONARY ANGIOGRAPHY N/A 02/15/2019   Procedure: RIGHT/LEFT HEART CATH AND CORONARY ANGIOGRAPHY;  Surgeon: Florencio Cara BIRCH, MD;  Location: [facility] INV[facility]IVE CV [facility];  Service: Cardiovascular;  Laterality: N/A;    FAMILY HISTORY: No family history on file.  ADVANCED DIRECTIVES (Y/N):  N  HEALTH MAINTENANCE: Social History   Tobacco Use   Smoking status: Former    Current packs/day: 0.00    Average packs/day: 1 pack/day for 45.0 years (45.0 ttl pk-yrs)    Types: Cigarettes    Start date: 07/24/1976    Quit date: 07/24/2021    Years since quitting: 2.6   Smokeless tobacco: Never  Vaping Use   Vaping status: Never Used  Substance Use Topics   Alcohol use: Not Currently    Alcohol/week: 2.0 standard drinks of alcohol  Types: 2 Shots of liquor per week    Comment: gin   Drug use: Never     Colonoscopy:  PAP:  Bone density:  Lipid panel:  Allergies  Allergen Reactions   Ace Inhibitors Cough    Current Outpatient Medications  Medication Sig Dispense Refill   allopurinol (ZYLOPRIM)  100 MG tablet Take 1 tablet by mouth daily. (Patient not taking: Reported on 03/03/2023)     amiodarone (PACERONE) 200 MG tablet Take by mouth. (Patient not taking: Reported on 03/03/2023)     apixaban (ELIQUIS) 5 MG TABS tablet Take by mouth.     aspirin  EC 81 MG tablet Take by mouth.     atorvastatin (LIPITOR) 20 MG tablet Take by mouth.     clopidogrel (PLAVIX) 300 MG TABS tablet Take by mouth. (Patient not taking: Reported on 05/05/2023)     colchicine 0.6 MG tablet Take 0.6 mg by mouth daily as needed (gout flares).     colchicine 0.6 MG tablet Take 2 tablets (1.2mg ) by mouth at first sign of gout flare followed by 1 tablet (0.6mg ) after 1 hour. (Max 1.8mg  within 1 hour)     empagliflozin (JARDIANCE) 10 MG TABS tablet Take 1 tablet by mouth daily.     Fluticasone-Umeclidin-Vilant (TRELEGY ELLIPTA) 100-62.5-25 MCG/ACT AEPB Inhale into the lungs.     furosemide  (LASIX ) 20 MG tablet Take 20 mg by mouth daily at 12 noon.     ketoconazole  (NIZORAL ) 2 % cream For fungal infection apply to the feet QHS. 60 g 6   losartan (COZAAR) 100 MG tablet Take 100 mg by mouth daily at 12 noon. (Patient not taking: Reported on 03/03/2023)     metoprolol succinate (TOPROL-XL) 25 MG 24 hr tablet Take by mouth daily at 12 noon.     naproxen sodium (ALEVE) 220 MG tablet Take 440 mg by mouth daily as needed (pain).     potassium chloride (KLOR-CON) 20 MEQ packet Take 20 mEq by mouth daily at 12 noon.     rosuvastatin (CRESTOR) 20 MG tablet Take by mouth.     sacubitril-valsartan (ENTRESTO) 24-26 MG Take 1 tablet by mouth 2 (two) times daily.     spironolactone (ALDACTONE) 25 MG tablet Take 25 mg by mouth daily.     torsemide (DEMADEX) 20 MG tablet Take 20 mg by mouth daily.     traZODone (DESYREL) 50 MG tablet Take 50 mg by mouth at bedtime.     No current facility-administered medications for this visit.    OBJECTIVE: There were no vitals filed for this visit.    There is no height or weight on file to calculate  BMI.    ECOG FS:0 - Asymptomatic  General: Well-developed, well-nourished, no acute distress. HEENT: Normocephalic. Neuro: Alert, answering all questions appropriately. Cranial nerves grossly intact. Psych: Normal affect.  LAB RESULTS:  Lab Results  Component Value Date   NA 140 10/16/2012   K 3.9 10/16/2012   CL 110 (H) 10/16/2012   CO2 22 10/16/2012   GLUCOSE 99 10/16/2012   BUN 17 10/16/2012   CREATININE 0.96 06/07/2013   CALCIUM 8.3 (L) 10/16/2012   GFRNONAA >60 06/07/2013   GFRAA >60 06/07/2013    Lab Results  Component Value Date   WBC 11.1 (H) 02/07/2024   NEUTROABS 6.5 02/07/2024   HGB 16.5 02/07/2024   HCT 50.3 02/07/2024   MCV 96.7 02/07/2024   PLT 267 02/07/2024     STUDIES: No results found.  ASSESSMENT: Leukocytosis:  PLAN:  Leukocytosis: Chronic and unchanged.  Upon review of patient's chart, his total white blood cell count has remained chronically elevated since March 2014 ranging between 9.1 and 19.2.  His most recent result was reported at 11.1.  Peripheral blood flow cytometry and BCR-ABL mutation are both negative.  Patient does not require bone marrow biopsy.  No further intervention is needed.  No follow-up has been scheduled.  Please refer patient back if there are any questions or concerns.  I provided 20 minutes of face-to-face video visit time during this encounter which included chart review, counseling, and coordination of care as documented above.   Patient expressed understanding and was in agreement with this plan. He also understands that He can call clinic at any time with any questions, concerns, or complaints.    Evalene JINNY Reusing, MD   02/28/2024 1:10 PM

## 2024-10-02 ENCOUNTER — Ambulatory Visit: Admitting: Dermatology
